# Patient Record
Sex: Male | Born: 1965 | Race: Black or African American | Hispanic: No | Marital: Single | Smoking: Never smoker
Health system: Southern US, Community
[De-identification: ages and names within clinical notes are randomized; demographics above are authoritative.]

## PROBLEM LIST (undated history)

## (undated) DIAGNOSIS — E785 Hyperlipidemia, unspecified: Secondary | ICD-10-CM

## (undated) DIAGNOSIS — I10 Essential (primary) hypertension: Secondary | ICD-10-CM

## (undated) DIAGNOSIS — E119 Type 2 diabetes mellitus without complications: Secondary | ICD-10-CM

## (undated) HISTORY — DX: Hyperlipidemia, unspecified: E78.5

## (undated) HISTORY — DX: Type 2 diabetes mellitus without complications: E11.9

## (undated) HISTORY — DX: Essential (primary) hypertension: I10

---

## 2014-07-14 ENCOUNTER — Encounter (HOSPITAL_COMMUNITY): Payer: Self-pay | Admitting: Emergency Medicine

## 2014-07-14 DIAGNOSIS — J159 Unspecified bacterial pneumonia: Secondary | ICD-10-CM | POA: Insufficient documentation

## 2014-07-14 DIAGNOSIS — I10 Essential (primary) hypertension: Secondary | ICD-10-CM | POA: Insufficient documentation

## 2014-07-14 MED ORDER — ACETAMINOPHEN 325 MG PO TABS
650.0000 mg | ORAL_TABLET | Freq: Once | ORAL | Status: AC
  Administered 2014-07-14: 650 mg via ORAL
  Filled 2014-07-14: qty 2

## 2014-07-14 NOTE — ED Notes (Signed)
Pt presents with productive cough for the past 3 days- admits that today he noticed blood in his sputum.  Denies pain.

## 2014-07-15 ENCOUNTER — Encounter (HOSPITAL_COMMUNITY): Payer: Self-pay | Admitting: Radiology

## 2014-07-15 ENCOUNTER — Emergency Department (HOSPITAL_COMMUNITY): Payer: Self-pay

## 2014-07-15 ENCOUNTER — Emergency Department (HOSPITAL_COMMUNITY)
Admission: EM | Admit: 2014-07-15 | Discharge: 2014-07-15 | Disposition: A | Discharge status: Home / Self Care | Payer: Self-pay | Attending: Emergency Medicine | Admitting: Emergency Medicine

## 2014-07-15 DIAGNOSIS — R0602 Shortness of breath: Secondary | ICD-10-CM

## 2014-07-15 DIAGNOSIS — J189 Pneumonia, unspecified organism: Secondary | ICD-10-CM

## 2014-07-15 DIAGNOSIS — I10 Essential (primary) hypertension: Secondary | ICD-10-CM

## 2014-07-15 LAB — CBC
HEMATOCRIT: 36.9 % — AB (ref 39.0–52.0)
Hemoglobin: 12.9 g/dL — ABNORMAL LOW (ref 13.0–17.0)
MCH: 27.6 pg (ref 26.0–34.0)
MCHC: 35 g/dL (ref 30.0–36.0)
MCV: 78.8 fL (ref 78.0–100.0)
PLATELETS: 374 10*3/uL (ref 150–400)
RBC: 4.68 MIL/uL (ref 4.22–5.81)
RDW: 12.1 % (ref 11.5–15.5)
WBC: 6.1 10*3/uL (ref 4.0–10.5)

## 2014-07-15 LAB — COMPREHENSIVE METABOLIC PANEL
ALT: 35 U/L (ref 0–53)
AST: 33 U/L (ref 0–37)
Albumin: 3.1 g/dL — ABNORMAL LOW (ref 3.5–5.2)
Alkaline Phosphatase: 69 U/L (ref 39–117)
Anion gap: 10 (ref 5–15)
BUN: <5 mg/dL — ABNORMAL LOW (ref 6–23)
CALCIUM: 8.2 mg/dL — AB (ref 8.4–10.5)
CO2: 26 mmol/L (ref 19–32)
Chloride: 96 mmol/L (ref 96–112)
Creatinine, Ser: 1.04 mg/dL (ref 0.50–1.35)
GFR calc Af Amer: >90 mL/min (ref >90)
GFR calc non Af Amer: 83 mL/min — ABNORMAL LOW (ref >90)
Glucose, Bld: 159 mg/dL — ABNORMAL HIGH (ref 70–99)
Potassium: 3.2 mmol/L — ABNORMAL LOW (ref 3.5–5.1)
SODIUM: 132 mmol/L — AB (ref 135–145)
TOTAL PROTEIN: 7.8 g/dL (ref 6.0–8.3)
Total Bilirubin: 1.2 mg/dL (ref 0.3–1.2)

## 2014-07-15 LAB — I-STAT TROPONIN, ED: Troponin i, poc: 0 ng/mL (ref 0.00–0.08)

## 2014-07-15 MED ORDER — POTASSIUM CHLORIDE CRYS ER 20 MEQ PO TBCR
20.0000 meq | EXTENDED_RELEASE_TABLET | Freq: Once | ORAL | Status: AC
  Administered 2014-07-15: 20 meq via ORAL
  Filled 2014-07-15: qty 1

## 2014-07-15 MED ORDER — CEFTRIAXONE SODIUM 1 G IJ SOLR
1.0000 g | Freq: Once | INTRAMUSCULAR | Status: AC
  Administered 2014-07-15: 1 g via INTRAMUSCULAR
  Filled 2014-07-15: qty 10

## 2014-07-15 MED ORDER — AZITHROMYCIN 250 MG PO TABS: 250.0000 mg | ORAL_TABLET | Freq: Every day | ORAL | Status: DC

## 2014-07-15 MED ORDER — HYDROCOD POLST-CHLORPHEN POLST 10-8 MG/5ML PO LQCR: 5.0000 mL | Freq: Two times a day (BID) | ORAL | Status: DC

## 2014-07-15 MED ORDER — AZITHROMYCIN 250 MG PO TABS
500.0000 mg | ORAL_TABLET | Freq: Once | ORAL | Status: AC
  Administered 2014-07-15: 500 mg via ORAL
  Filled 2014-07-15: qty 2

## 2014-07-15 MED ORDER — AMLODIPINE BESYLATE 10 MG PO TABS: 10.0000 mg | ORAL_TABLET | Freq: Once | ORAL | Status: DC

## 2014-07-15 MED ORDER — AMLODIPINE BESYLATE 5 MG PO TABS
10.0000 mg | ORAL_TABLET | Freq: Once | ORAL | Status: AC
  Administered 2014-07-15: 10 mg via ORAL
  Filled 2014-07-15: qty 2

## 2014-07-15 MED ORDER — IOHEXOL 350 MG/ML SOLN
80.0000 mL | Freq: Once | INTRAVENOUS | Status: AC | PRN
  Administered 2014-07-15: 71 mL via INTRAVENOUS

## 2014-07-15 MED ORDER — LIDOCAINE HCL (PF) 1 % IJ SOLN
5.0000 mL | Freq: Once | INTRAMUSCULAR | Status: AC
  Administered 2014-07-15: 5 mL
  Filled 2014-07-15: qty 5

## 2014-07-15 NOTE — Discharge Instructions (Signed)
It is VERY important that you establish with a primary care physician Use the resource to help. Please take all the antibiotic until completed Take your blood pressure medication at bedtime to allow your body to adjust       Emergency Department Resource Guide 1) Find a Doctor and Pay Out of Pocket Although you won't have to find out who is covered by your insurance plan, it is a good idea to ask around and get recommendations. You will then need to call the office and see if the doctor you have chosen will accept you as a new patient and what types of options they offer for patients who are self-pay. Some doctors offer discounts or will set up payment plans for their patients who do not have insurance, but you will need to ask so you aren't surprised when you get to your appointment.  2) Contact Your Local Health Department Not all health departments have doctors that can see patients for sick visits, but many do, so it is worth a call to see if yours does. If you don't know where your local health department is, you can check in your phone book. The CDC also has a tool to help you locate your state's health department, and many state websites also have listings of all of their local health departments.  3) Find a Walk-in Clinic If your illness is not likely to be very severe or complicated, you may want to try a walk in clinic. These are popping up all over the country in pharmacies, drugstores, and shopping centers. They're usually staffed by nurse practitioners or physician assistants that have been trained to treat common illnesses and complaints. They're usually fairly quick and inexpensive. However, if you have serious medical issues or chronic medical problems, these are probably not your best option.  No Primary Care Doctor: - Call Health Connect at  [phone] - they can help you locate a primary care doctor that  accepts your insurance, provides certain services, etc. - Physician  Referral Service- [phone]  Chronic Pain Problems: Organization         Address  Phone   Notes  [facility]  [phone] Patients need to be referred by their primary care doctor.   Medication Assistance: Organization         Address  Phone   Notes  [facility] [place] [facility] [address] [place], [place] [address] [phone]) [phone] --Must be a resident of [facility] [place] [place] -- Must have NO insurance coverage whatsoever (no Medicaid/ Medicare, etc.) -- The pt. MUST have a primary care doctor that directs their care regularly and follows them in the community   MedAssist  [phone]   [employer]  [phone]    Agencies that provide inexpensive medical care: Organization         Address  Phone   Notes  [facility]  [phone]   [facility]    [phone]   [facility] [address] [place], [place] [address] [phone]   [facility] [address], [place] [phone]   [facility]    [phone]) [phone]   [facility]    [phone]) [phone]   [facility]  [address], [place] Phone:  [phone]) [phone], Fax:  [phone]) [phone] Hours of Operation:  9 am - 6 pm, M-F.  Also accepts Medicaid/Medicare  and self-pay.  [facility] for [place], [address], [place] Phone: [phone], Fax: [phone]. Hours of Operation:  8:30 am - 5:30 pm, M-F.  Also accepts Medicaid and self-pay.  [facility] [place] [address] [place] Phone: [phone]   [place], [place], [place] [phone], Ext. [phone] Mondays & Thursdays: 7-9 AM.  First 15 patients are seen on a first come, first serve basis.    [place] Providers:  Organization         Address  Phone   Notes  [facility] [address],  [phone] Also accepts self-pay patients.  [facility] [address], [place]  [phone]   [place], [address], [place] [phone]   [facility] [address], [place] [phone]   Lucianne Lei [address], [place]   [phone] Only accepts [place] Access [place] patients after they have their name applied to their card.   Self-Pay (no insurance) in [facility]:  Organization         Address  Phone   Notes  Sickle Cell Patients, [facility] [place] [phone]   [facility] [place] [phone]   [facility] [place]  [place], [place],  [phone]   [facility]/Dr. Osei-Bonsu  [address], [place] or [address], [place] [phone] Phone number for both [place] and [place] locations is the same.  Urgent [facility] and [facility] [address], [place] [phone]   [facility] [address] [place] or [address] [phone]   [facility] [address], [place] [phone], phone; [phone], fax Sees patients 1st and 3rd Saturday of every month.  Must not qualify for public or private insurance (i.e. Medicaid, Medicare, [place] Health Choice, Veterans' Benefits)  Household income should be no more than 200% of the poverty level The clinic cannot treat you if you are pregnant or think you are pregnant  Sexually transmitted diseases are not treated at the clinic.    Dental Care: Organization         Address  Phone  Notes  [facility] Department of [facility] [place] [phone] Accepts children up to age 51 who are enrolled  in [place] or [place]; pregnant women with a Medicaid card; and children who have applied for Medicaid or [place] Health Choice, but were declined, whose parents can pay a reduced fee at time of service.  [facility] Department of [facility]  [address], [place] [phone] Accepts children up to age 68 who are enrolled in [place] or [place]; pregnant women with a Medicaid card; and children who have applied for Medicaid or  Health Choice, but were declined, whose parents can pay a reduced fee at time of service.  [facility]  [place] [phone] Patients are seen by appointment only. Walk-ins are not accepted. [place] will see patients 22 years of age and older. Monday - Tuesday (8am-5pm) Most Wednesdays (  8:30-5pm) $30 per visit, cash only  [facility] Access PROGRAM  [address], [facility] [phone] Patients are seen by appointment only. Walk-ins are not accepted. [place] will see patients 65 years of age and older. One Wednesday Evening (Monthly: Volunteer Based).  $30 per visit, cash only  [place]  [phone] for adults; Children under age 65, call Graduate Pediatric Dentistry at [phone]. Children aged 30-14, please call [phone] to request a pediatric application.  Dental services are provided in all areas of dental care including fillings, crowns and bridges, complete and partial dentures, implants, gum treatment, root canals, and extractions. Preventive care is also provided. Treatment is provided to both adults and children. Patients are selected via a lottery and there is often a waiting list.   [facility] [address], [place]  [phone] [email or web address]   [facility] [address] [place], [place] [phone], Ext. [phone] Second and Fourth Thursday of each month, opens at  6:30 AM; Clinic ends at 9 AM.  Patients are seen on a first-come first-served basis, and a limited number are seen during each clinic.   [facility]  [address] Hillard Danker [place], [place] [phone]   Eligibility Requirements You must have lived in [place], [place], or [place] counties for at least the last three months.   You cannot be eligible for state or federal sponsored [employer], including [employer], [place], or [employer].   You generally cannot be eligible for healthcare insurance through your employer.    How to apply: Eligibility screenings are held every Tuesday and Wednesday afternoon from 1:00 pm until 4:00 pm. You do not need an appointment for the interview!  [facility] [address] [place], [place]   [place]  [place] Department  [place]  [phone]    Behavioral Health Resources in the Community: Intensive Outpatient Programs Organization         Address  Phone  Notes  [place] [place]. [address] [place], [place] [phone]   [facility] [address] [place], [place]   ADS: Alcohol & Drug Svcs [address], [place], [place]   [place] [address],  [place], [place] or [phone]   Substance Abuse Resources Organization         Address  Phone  Notes  Alcohol and Drug Services  [phone]   [place]  [phone]   The [place]   Chinita Pester  [phone]   Residential & Outpatient Substance Abuse Program  [phone]   Psychological Services Organization         Address  Phone  Notes  [facility] [place]  [place]  [phone]   [place] [address] [place] or [phone]    Mobile Crisis Teams Organization         Address  Phone  Notes  [facility], Mobile Crisis Care Unit  [phone]   [facility]  [address] [place], [place]   [place] Levels [address] [place] [phone]    Self-Help/Support Groups Organization         Address  Phone  Notes  [facility] of [place] - variety of support groups  [place] Call for more information  Narcotics Anonymous (NA), [facility] [address], [employer] [place]  2 meetings at this location   Special educational needs teacher         Address  Phone  Notes  [facility] Residential Treatment [place],    [place]  [phone]   [facility]  [address], [place] [phone], [place], [place]   [place], [place] [phone] Admissions: 8am-3pm M-F  Incentives Substance [place] [address],    [place], [place] [phone]   [facility] [address] [place], [place], [place]   [facility] [address],  [place], [place]   [facility] Programs - Intensive Outpatient [address] Kristeen Mans [address], [place], [place]   [facility] ([place].) [address].,  [place], [place] [phone] or [phone]   [facility] (RTS) [address] [place], [place] Accepts Medicaid  [place] [address]  [place] [phone] Substance Abuse/Addiction Treatment   [facility] Organization         Address  Phone  Notes  [facility]  [phone]   Domenic Schwab, PhD [address] Arlis Porta [place], [place]   [phone] or [phone]   [place], [place] [phone]   [facility]  [address] [place], [place] [phone] Insurance/Medicaid/sponsorship through [facility] and Families [address], [address] [place]                                    [place], [place] [phone] [place] [address], [place] [phone]    Dr. Adele Schilder  [phone]   [facility] Dept. 1) [address], [place] 2) [place] 3)  [place] 65, [place] [phone]  [phone]   [place] [phone] or [phone] (After Hours)

## 2014-07-15 NOTE — ED Notes (Signed)
Pt c/o cough and hoarseness. Reports having the flu last week with persistent cough. Pt noted to be hypertensive; no previous history. No neuro deficits.

## 2014-07-15 NOTE — ED Provider Notes (Signed)
CSN: [id number]     Arrival date & time 07/14/14  2320 History   First MD Initiated Contact with Patient 07/15/14 0216     Chief Complaint  Patient presents with  . Cough     (Consider location/radiation/quality/duration/timing/severity/associated sxs/prior Treatment) HPI Comments: This is a 49 year old normally healthy male who reports that last week he had the flu, since that time.  He said persistent, intermittent, nonproductive cough that is being some concerning to him.  He denies any shortness of breath, history of asthma or breathing difficulties.   Patient is a 49 y.o. male presenting with cough. The history is provided by the patient.  Cough Cough characteristics:  Non-productive Severity:  Moderate Onset quality:  Gradual Duration:  1 week Timing:  Intermittent Progression:  Unchanged Chronicity:  New Relieved by:  Nothing Worsened by:  Activity Ineffective treatments:  None tried Associated symptoms: no chest pain, no fever, no shortness of breath and no wheezing     History reviewed. No pertinent past medical history. History reviewed. No pertinent past surgical history. No family history on file. History  Substance Use Topics  . Smoking status: Never Smoker   . Smokeless tobacco: Not on file  . Alcohol Use: No    Review of Systems  Constitutional: Negative for fever.  Respiratory: Positive for cough. Negative for chest tightness, shortness of breath and wheezing.   Cardiovascular: Negative for chest pain, palpitations and leg swelling.  All other systems reviewed and are negative.     Allergies  Review of patient's allergies indicates no known allergies.  Home Medications   Prior to Admission medications   Medication Sig Start Date End Date Taking? Authorizing Provider  amLODipine (NORVASC) 10 MG tablet Take 1 tablet (10 mg total) by mouth once. 07/15/14   Arman Filter, NP  azithromycin (ZITHROMAX) 250 MG tablet Take 1 tablet (250 mg total) by mouth  daily. 07/15/14   Arman Filter, NP  chlorpheniramine-HYDROcodone (TUSSIONEX PENNKINETIC ER) 10-8 MG/5ML LQCR Take 5 mLs by mouth 2 (two) times daily. 07/15/14   Arman Filter, NP   BP 174/88 mmHg  Pulse 94  Temp(Src) 99.3 F (37.4 C)  Resp 18  Ht  (1.854 m)  Wt 230 lb (104.327 kg)  BMI 30.35 kg/m2  SpO2 94% Physical Exam  Constitutional: He appears well-developed and well-nourished. No distress.  HENT:  Head: Normocephalic.  Eyes: Pupils are equal, round, and reactive to light.  Neck: Normal range of motion.  Cardiovascular: Normal rate and regular rhythm.   Pulmonary/Chest: Effort normal and breath sounds normal.  Abdominal: Soft.  Musculoskeletal: Normal range of motion. He exhibits no edema.  Neurological: He is alert.  Skin: Skin is warm.  Nursing note and vitals reviewed.   ED Course  Procedures (including critical care time) Labs Review Labs Reviewed  CBC - Abnormal; Notable for the following:    Hemoglobin 12.9 (*)    HCT 36.9 (*)    All other components within normal limits  COMPREHENSIVE METABOLIC PANEL - Abnormal; Notable for the following:    Sodium 132 (*)    Potassium 3.2 (*)    Glucose, Bld 159 (*)    BUN <5 (*)    Calcium 8.2 (*)    Albumin 3.1 (*)    GFR calc non Af Amer 83 (*)    All other components within normal limits  Rosezena Sensor, ED    Imaging Review Dg Chest 2 View  07/15/2014   CLINICAL DATA:  Cough, shortness of breath, and fever for 4 days. Coughing up blood today.  EXAM: CHEST  2 VIEW  COMPARISON:  None.  FINDINGS: Focal airspace consolidation in the the anterior segment right upper lung consistent with pneumonia. Follow-up until resolution is recommended to exclude underlying obstructing lesion. It left lung is clear. Heart size and pulmonary vascularity are normal.  IMPRESSION: Focal airspace consolidation in the anterior segment right upper lung most consistent with with pneumonia. Follow-up until resolution is recommended to  exclude underlying mass lesion.   Electronically Signed   By: Burman Nieves M.D.   On: 07/15/2014 00:35   Ct Angio Chest Pe W/cm &/or Wo Cm  07/15/2014   CLINICAL DATA:  Cough and hoarseness. Flu last week with persistent cough. Shortness of breath.  EXAM: CT ANGIOGRAPHY CHEST WITH CONTRAST  TECHNIQUE: Multidetector CT imaging of the chest was performed using the standard protocol during bolus administration of intravenous contrast. Multiplanar CT image reconstructions and MIPs were obtained to evaluate the vascular anatomy.  CONTRAST:  71mL OMNIPAQUE IOHEXOL 350 MG/ML SOLN  COMPARISON:  Chest radiograph 07/15/2014  FINDINGS: Technically adequate study with good opacification of the central and segmental pulmonary arteries. No focal filling defects are demonstrated. No evidence of significant pulmonary embolus.  Normal heart size. Normal caliber thoracic aorta. No aortic dissection. Great vessel origins are patent. Esophagus is decompressed. No significant lymphadenopathy in the chest.  Dense airspace consolidation in the right middle lung with air bronchograms. Patchy infiltrates in the right upper lung, right lower lung, and left lung base. Changes likely represent multifocal pneumonia. Changes are likely represent multifocal pneumonia. Followup after resolution of acute process is recommended to exclude underlying neoplasm. No pleural effusions. No pneumothorax.  Included portions of the upper abdominal organs are grossly unremarkable. Small accessory spleen. No destructive bone lesions.  Review of the MIP images confirms the above findings.  IMPRESSION: No evidence of significant pulmonary embolus. Right middle lobe consolidation with patchy airspace disease throughout both lungs most consistent with multifocal pneumonia. Follow-up to resolution is recommended.   Electronically Signed   By: Burman Nieves M.D.   On: 07/15/2014 04:01     EKG Interpretation   Date/Time:  Wednesday July 14 2014  23:37:33 EST Ventricular Rate:  118 PR Interval:  132 QRS Duration: 82 QT Interval:  320 QTC Calculation: 448 R Axis:   70 Text Interpretation:  Sinus tachycardia Minimal voltage criteria for LVH,  may be normal variant Borderline ECG [id number] No old tracing to compare  Confirmed by Rhunette Croft, MD, Janey Genta [id number]) on 07/15/2014 2:52:18 AM     On physical exam patient appears well.  He has not tachypnea.  Cor hypoxic, but he is noted to have hypertension.  No history of hypertension.  Personally, but states his father has been hypertensive for many years.  Denies any visual disturbances, headaches, shortness of breath, chest pain, leg swelling.  Denies any recent travel. Due to the appearance of the pneumonia on chest x-ray and his EKG result, it is determined that he needs to have a CT angiogram of his chest to rule out PE.  This was explained to the patient who agrees CT angiography is negative for pulmonary M plus but does show that he has multilevel lobar pneumonia.  He has received IM Rocephin in the emergency department has been started on azithromycin.  He will also be started on Norvasc for blood pressure control.  He's been encouraged to light a primary care physician within the next  2 weeks MDM   Final diagnoses:  SOB (shortness of breath)  CAP (community acquired pneumonia)  Essential hypertension        Arman FilterGail K Winnifred Dufford, NP 07/15/14 [id number]  Derwood KaplanAnkit Nanavati, MD 07/16/14 0430

## 2015-08-06 IMAGING — CT CT ANGIO CHEST
2 of 8 series · 19 of 46 positions shown · IV contrast (omnipaque)
Comparison: Chest radiograph 07/15/2014

CLINICAL DATA: Cough and hoarseness. Flu last week with persistent
cough. Shortness of breath.

EXAM:
CT ANGIOGRAPHY CHEST WITH CONTRAST
TECHNIQUE: Multidetector CT imaging of the chest was performed using the
standard protocol during bolus administration of intravenous
contrast. Multiplanar CT image reconstructions and MIPs were
obtained to evaluate the vascular anatomy.
CONTRAST:  71mL OMNIPAQUE IOHEXOL 350 MG/ML SOLN

[Series 5: thins · axial · 0.67mm/px · z∈[-295,-15]mm · 16 of 308 slices shown]
[im 14/308  lung]
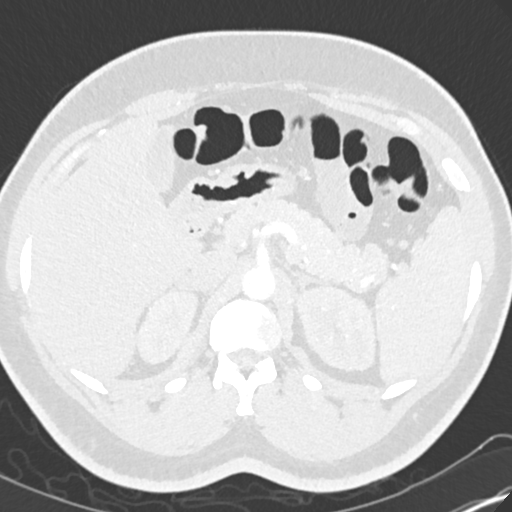
[im 28/308  soft-tissue]
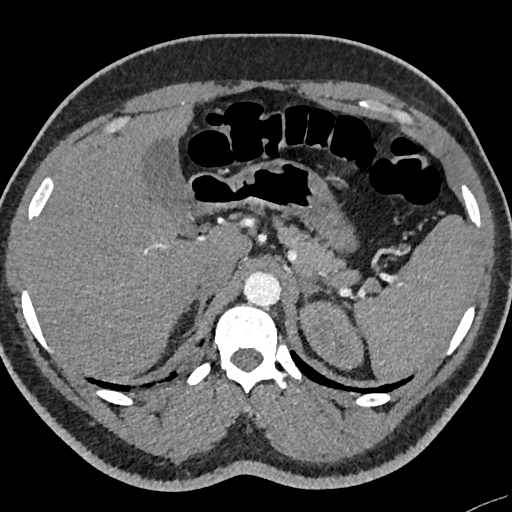
[im 56/308  lung]
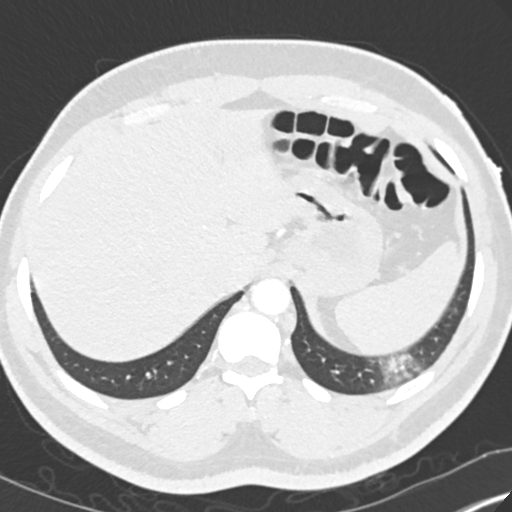
[im 70/308  soft-tissue]
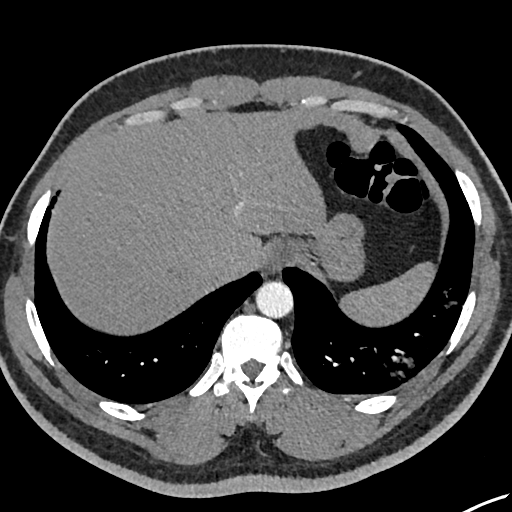
[im 84/308  lung]
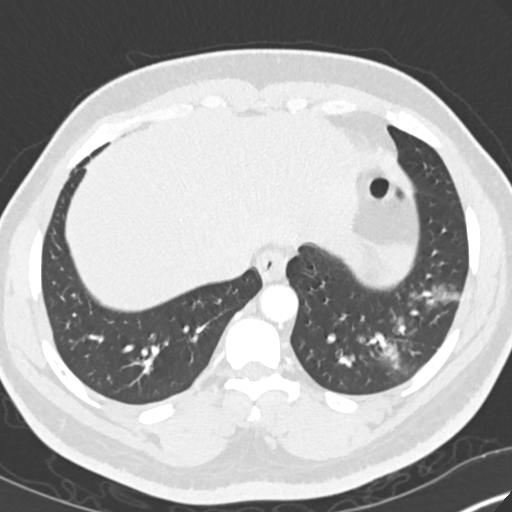
[im 112/308  soft-tissue]
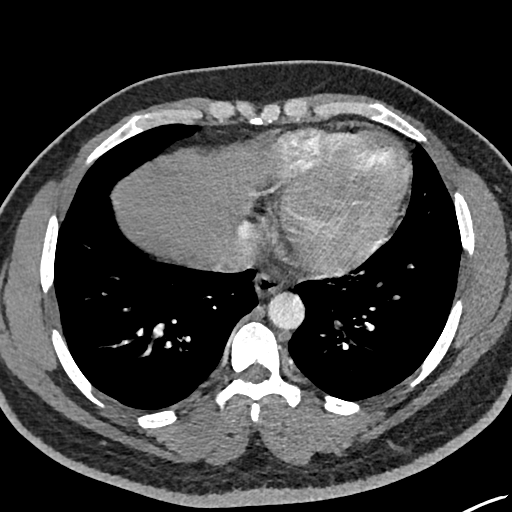
[im 126/308  lung]
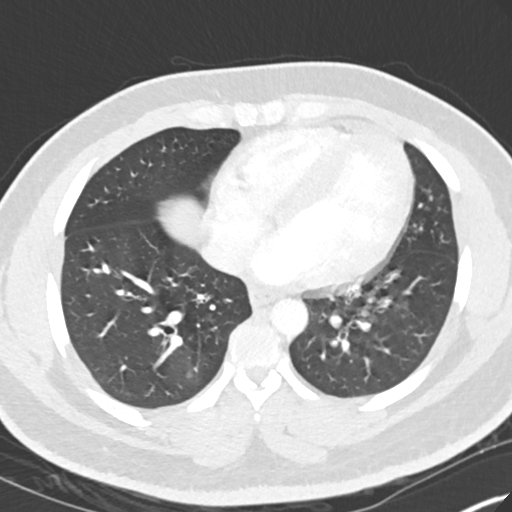
[im 140/308  soft-tissue]
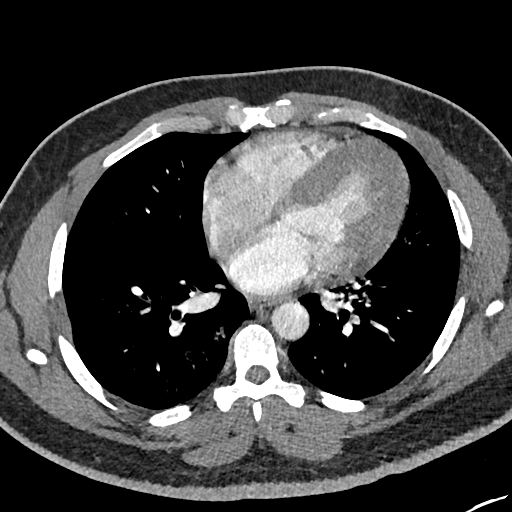
[im 168/308  lung]
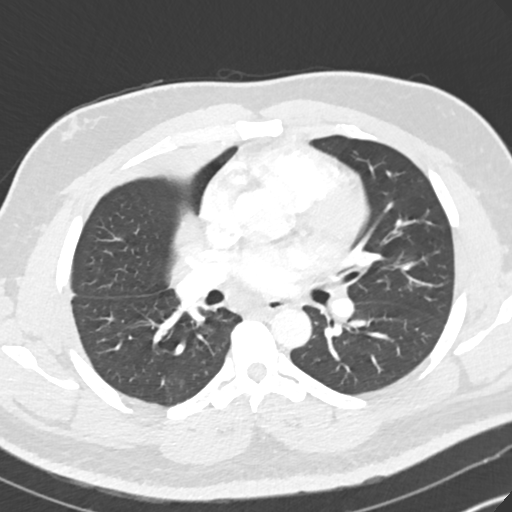
[im 182/308  soft-tissue]
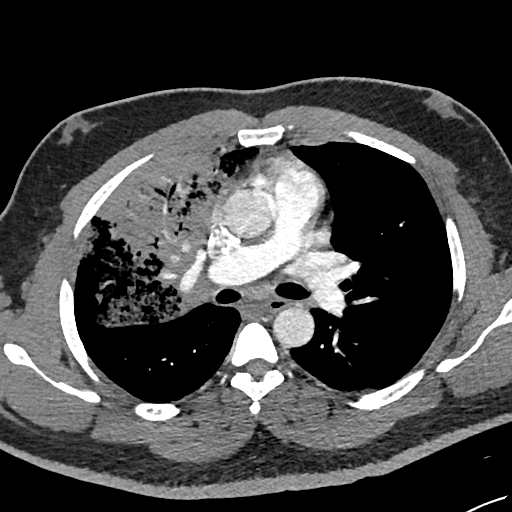
[im 196/308  lung]
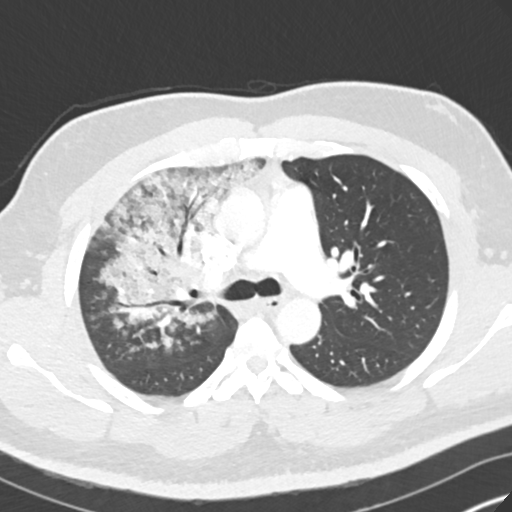
[im 224/308  soft-tissue]
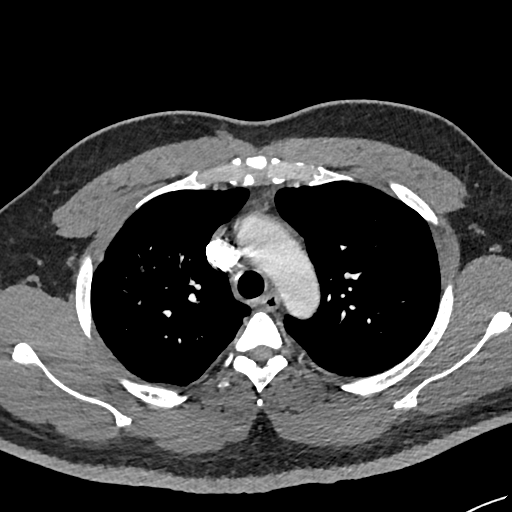
[im 238/308  lung]
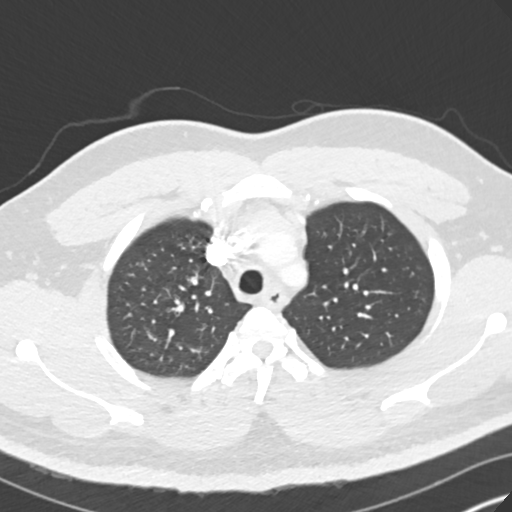
[im 252/308  soft-tissue]
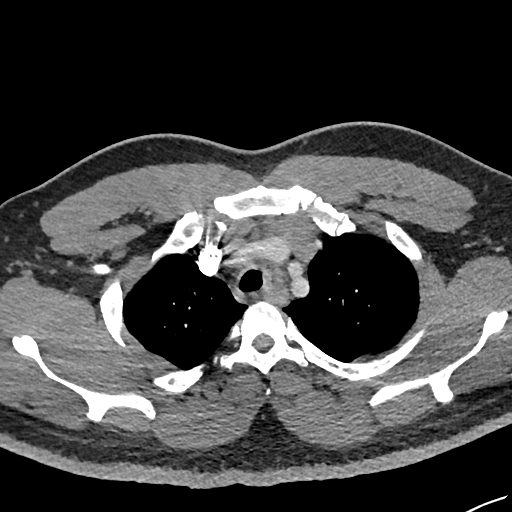
[im 280/308  lung]
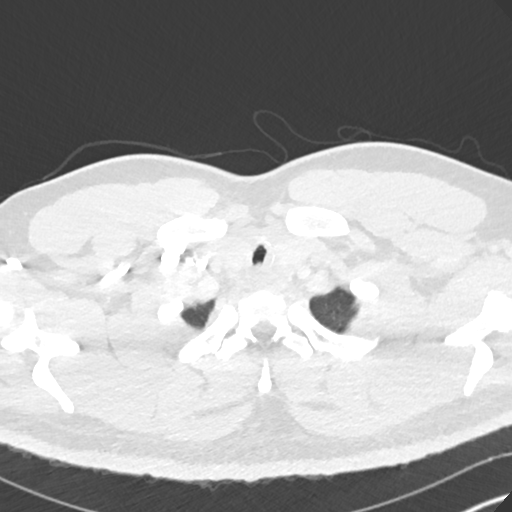
[im 294/308  soft-tissue]
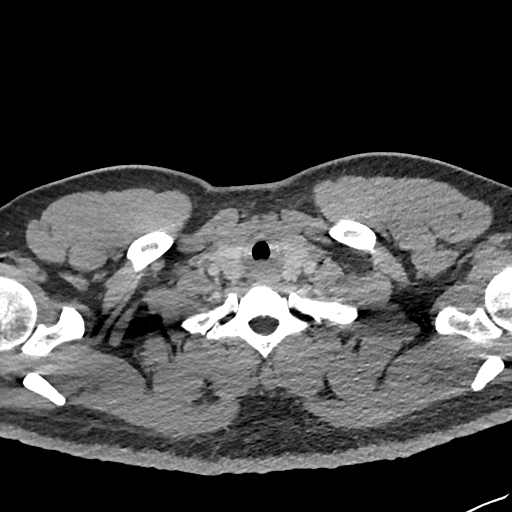

[Series 7: coronal mpr · coronal · 0.61mm/px · 3 of 119 slices shown]
[im 30/119  soft-tissue]
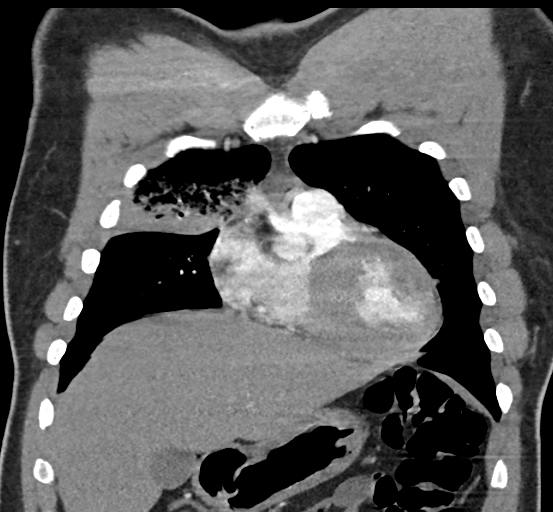
[im 60/119  soft-tissue]
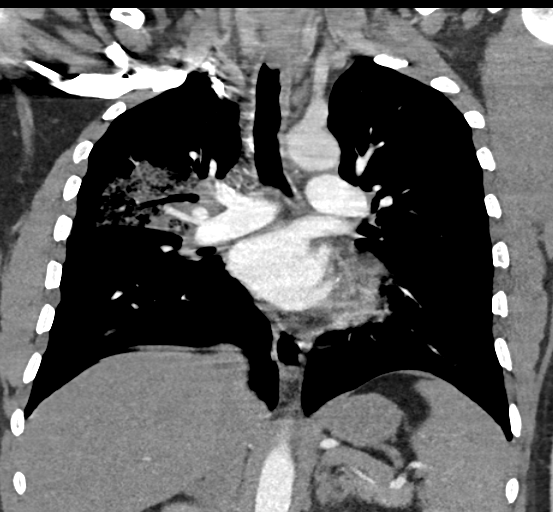
[im 89/119  soft-tissue]
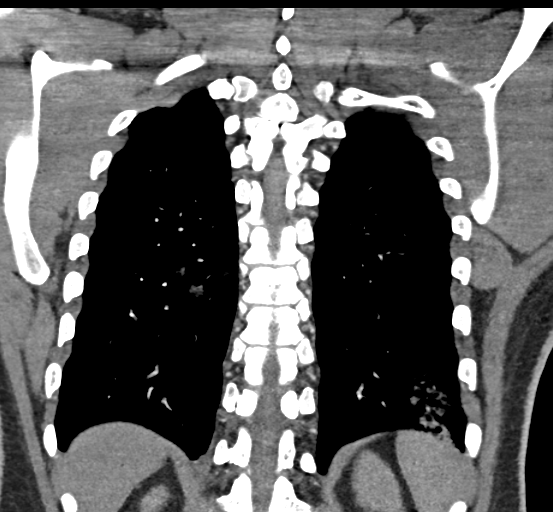

[19 of 46 positions shown; findings below may reference images not displayed]

FINDINGS: Technically adequate study with good opacification of the central
and segmental pulmonary arteries. No focal filling defects are
demonstrated. No evidence of significant pulmonary embolus.

Normal heart size. Normal caliber thoracic aorta. No aortic
dissection. Great vessel origins are patent. Esophagus is
decompressed. No significant lymphadenopathy in the chest.

Dense airspace consolidation in the right middle lung with air
bronchograms. Patchy infiltrates in the right upper lung, right
lower lung, and left lung base. Changes likely represent multifocal
pneumonia. Changes are likely represent multifocal pneumonia.
Followup after resolution of acute process is recommended to exclude
underlying neoplasm. No pleural effusions. No pneumothorax.

Included portions of the upper abdominal organs are grossly
unremarkable. Small accessory spleen. No destructive bone lesions.

Review of the MIP images confirms the above findings.
IMPRESSION: No evidence of significant pulmonary embolus. Right middle lobe
consolidation with patchy airspace disease throughout both lungs
most consistent with multifocal pneumonia. Follow-up to resolution
is recommended.

## 2020-10-11 ENCOUNTER — Encounter: Payer: 59 | Attending: Internal Medicine | Admitting: Dietician

## 2020-10-11 ENCOUNTER — Other Ambulatory Visit: Payer: Self-pay

## 2020-10-11 ENCOUNTER — Encounter: Payer: Self-pay | Admitting: Dietician

## 2020-10-11 DIAGNOSIS — E119 Type 2 diabetes mellitus without complications: Secondary | ICD-10-CM | POA: Diagnosis present

## 2020-10-11 NOTE — Progress Notes (Signed)
Patient was seen on 10/11/20 for the first of a series of three diabetes self[facility]management courses [facility].  Patient Education Plan per assessed needs and concerns is to attend three course education program for Diabetes Self Management Education.  The following learning objectives were met by the patient during this class:  Describe diabetes, types of diabetes and pathophysiology  State some common risk factors for diabetes  Defines the role of glucose and insulin  Describe the relationship between diabetes and cardiovascular and other risks  State the members of the Healthcare Team  States the rationale for glucose monitoring and when to test  State their individual Target Range  State the importance of logging glucose readings and how to interpret the readings  Identifies A1C target  Explain the correlation between A1c and eAG values  State symptoms and treatment of high blood glucose and low blood glucose  Explain proper technique for glucose testing and identify proper sharps disposal  Handouts given during class include:  How to Thrive:  A Guide for Your Journey with Diabetes by the ADA  Meal Plan Card and carbohydrate content list  Dietary intake form  Low Sodium Flavoring Tips  Types of Fats  Dining Out  Label reading  Snack list  Planning a balanced meal  The diabetes portion plate  Diabetes Resources  A1c to eAG Conversion Chart  Blood Glucose Log  Diabetes Recommended Care Schedule  Support Group  Diabetes Success Plan  Core Class Satisfaction Survey   Follow-Up Plan:  Attend core 2  

## 2020-10-17 ENCOUNTER — Other Ambulatory Visit: Payer: Self-pay | Admitting: Internal Medicine

## 2020-10-17 DIAGNOSIS — I10 Essential (primary) hypertension: Secondary | ICD-10-CM

## 2020-10-18 ENCOUNTER — Ambulatory Visit: Payer: 59

## 2020-10-25 ENCOUNTER — Ambulatory Visit: Payer: 59

## 2020-11-02 ENCOUNTER — Other Ambulatory Visit: Payer: 59

## 2020-11-04 ENCOUNTER — Other Ambulatory Visit: Payer: 59

## 2020-11-08 ENCOUNTER — Ambulatory Visit: Payer: 59 | Admitting: Dietician

## 2020-11-15 ENCOUNTER — Ambulatory Visit: Payer: 59

## 2020-11-17 ENCOUNTER — Other Ambulatory Visit: Payer: 59

## 2022-01-05 NOTE — Progress Notes (Deleted)
Name: Adam Beasley  MRN/ DOB: [id number], 1966-06-09    Age/ Sex: 56 y.o., male    PCP: Harvest Forest, MD   Reason for Endocrinology Evaluation: Resistant HTN     Date of Initial Endocrinology Evaluation: 01/08/2022     HPI: [name] is a 56 y.o. male with a past medical history of T2DM, HTN and dyslipidemia. The patient presented for initial endocrinology clinic visit on 01/08/2022 for consultative assistance with his resistant HTN.   Patient has been referred to endocrinology for further evaluation of resistant HTN and screening for hyperaldosteronism        HISTORY:  Past Medical History:  Past Medical History:  Diagnosis Date   Diabetes mellitus without complication (HCC)    Hyperlipidemia    Hypertension    Past Surgical History: No past surgical history on file.  Social History:  reports that he has never smoked. He has never used smokeless tobacco. He reports that he does not drink alcohol. Family History: family history is not on file.   HOME MEDICATIONS: Allergies as of 01/08/2022   No Known Allergies      Medication List        Accurate as of January 08, 2022  6:58 AM. If you have any questions, ask your nurse or doctor.          amLODipine 10 MG tablet Commonly known as: NORVASC Take 1 tablet (10 mg total) by mouth once.   atorvastatin 40 MG tablet Commonly known as: LIPITOR Take 40 mg by mouth daily.   azithromycin 250 MG tablet Commonly known as: ZITHROMAX Take 1 tablet (250 mg total) by mouth daily.   chlorpheniramine-HYDROcodone 10-8 MG/5ML Lqcr Commonly known as: Tussionex Pennkinetic ER Take 5 mLs by mouth 2 (two) times daily.   losartan 25 MG tablet Commonly known as: COZAAR Take 20 mg by mouth daily.   metFORMIN 500 MG tablet Commonly known as: GLUCOPHAGE Take by mouth 2 (two) times daily with a meal.   spironolactone 50 MG tablet Commonly known as: ALDACTONE Take 50 mg by mouth daily.           REVIEW OF SYSTEMS: A comprehensive ROS was conducted with the patient and is negative except as per HPI and below:  ROS     OBJECTIVE:  VS: There were no vitals taken for this visit.   Wt Readings from Last 3 Encounters:  10/11/20 241 lb (109.3 kg)  07/14/14 230 lb (104.3 kg)     EXAM: General: Pt appears well and is in NAD  Hydration: Well-hydrated with moist mucous membranes and good skin turgor  Eyes: External eye exam normal without stare, lid lag or exophthalmos.  EOM intact.  PERRL.  Ears, Nose, Throat: Hearing: Grossly intact bilaterally Dental: Good dentition  Throat: Clear without mass, erythema or exudate  Neck: General: Supple without adenopathy. Thyroid: Thyroid size normal.  No goiter or nodules appreciated. No thyroid bruit.  Lungs: Clear with good BS bilat with no rales, rhonchi, or wheezes  Heart: Auscultation: RRR.  Abdomen: Normoactive bowel sounds, soft, nontender, without masses or organomegaly palpable  Extremities: Gait and station: Normal gait  Digits and nails: No clubbing, cyanosis, petechiae, or nodes Head and neck: Normal alignment and mobility BL UE: Normal ROM and strength. BL LE: No pretibial edema normal ROM and strength.  Skin: Hair: Texture and amount normal with gender appropriate distribution Skin Inspection: No rashes, acanthosis nigricans/skin tags. No lipohypertrophy Skin Palpation: Skin temperature, texture, and  thickness normal to palpation  Neuro: Cranial nerves: II - XII grossly intact  Cerebellar: Normal coordination and movement; no tremor Motor: Normal strength throughout DTRs: 2+ and symmetric in UE without delay in relaxation phase  Mental Status: Judgment, insight: Intact Orientation: Oriented to time, place, and person Memory: Intact for recent and remote events Mood and affect: No depression, anxiety, or agitation     DATA REVIEWED: ***    ASSESSMENT/PLAN/RECOMMENDATIONS:   ***    Medications :  Signed  electronically by: Lyndle Herrlich, MD  [facility] Endocrinology  [facility] [address] [place], [place] [address] Phone: [phone] FAX: [phone]   CC: Harvest Forest, MD [address] [place] [address] Phone: [phone] Fax: [phone]   Return to Endocrinology clinic as below: Future Appointments  Date Time Provider Department Center  01/08/2022  7:50 AM Sarah-Jane Nazario, Konrad Dolores, MD [id number] None

## 2022-01-08 ENCOUNTER — Ambulatory Visit: Payer: Self-pay | Admitting: Internal Medicine

## 2022-07-24 ENCOUNTER — Other Ambulatory Visit: Payer: Self-pay | Admitting: Internal Medicine

## 2022-07-24 DIAGNOSIS — E2601 Conn's syndrome: Secondary | ICD-10-CM

## 2022-08-14 ENCOUNTER — Other Ambulatory Visit: Payer: Medicaid Other

## 2022-09-07 ENCOUNTER — Ambulatory Visit
Admission: RE | Admit: 2022-09-07 | Discharge: 2022-09-07 | Disposition: A | Discharge status: Home / Self Care | Payer: Medicaid Other | Source: Ambulatory Visit | Attending: Internal Medicine | Admitting: Internal Medicine

## 2022-09-07 DIAGNOSIS — E2601 Conn's syndrome: Secondary | ICD-10-CM | POA: Diagnosis not present

## 2022-12-12 DIAGNOSIS — E2601 Conn's syndrome: Secondary | ICD-10-CM | POA: Diagnosis not present

## 2022-12-12 DIAGNOSIS — Z1211 Encounter for screening for malignant neoplasm of colon: Secondary | ICD-10-CM | POA: Diagnosis not present

## 2022-12-12 DIAGNOSIS — I10 Essential (primary) hypertension: Secondary | ICD-10-CM | POA: Diagnosis not present

## 2022-12-12 DIAGNOSIS — Z23 Encounter for immunization: Secondary | ICD-10-CM | POA: Diagnosis not present

## 2022-12-12 DIAGNOSIS — R5383 Other fatigue: Secondary | ICD-10-CM | POA: Diagnosis not present

## 2022-12-12 DIAGNOSIS — Z125 Encounter for screening for malignant neoplasm of prostate: Secondary | ICD-10-CM | POA: Diagnosis not present

## 2022-12-12 DIAGNOSIS — Z0001 Encounter for general adult medical examination with abnormal findings: Secondary | ICD-10-CM | POA: Diagnosis not present

## 2022-12-12 DIAGNOSIS — E785 Hyperlipidemia, unspecified: Secondary | ICD-10-CM | POA: Diagnosis not present

## 2022-12-12 DIAGNOSIS — E669 Obesity, unspecified: Secondary | ICD-10-CM | POA: Diagnosis not present

## 2022-12-12 DIAGNOSIS — Z6833 Body mass index (BMI) 33.0-33.9, adult: Secondary | ICD-10-CM | POA: Diagnosis not present

## 2022-12-12 DIAGNOSIS — E1165 Type 2 diabetes mellitus with hyperglycemia: Secondary | ICD-10-CM | POA: Diagnosis not present

## 2022-12-28 DIAGNOSIS — Z0001 Encounter for general adult medical examination with abnormal findings: Secondary | ICD-10-CM | POA: Diagnosis not present

## 2022-12-28 DIAGNOSIS — E1165 Type 2 diabetes mellitus with hyperglycemia: Secondary | ICD-10-CM | POA: Diagnosis not present

## 2022-12-28 DIAGNOSIS — Z125 Encounter for screening for malignant neoplasm of prostate: Secondary | ICD-10-CM | POA: Diagnosis not present

## 2022-12-28 DIAGNOSIS — R5383 Other fatigue: Secondary | ICD-10-CM | POA: Diagnosis not present

## 2022-12-28 DIAGNOSIS — E785 Hyperlipidemia, unspecified: Secondary | ICD-10-CM | POA: Diagnosis not present

## 2023-01-16 DIAGNOSIS — E785 Hyperlipidemia, unspecified: Secondary | ICD-10-CM | POA: Diagnosis not present

## 2023-01-16 DIAGNOSIS — E669 Obesity, unspecified: Secondary | ICD-10-CM | POA: Diagnosis not present

## 2023-01-16 DIAGNOSIS — R5383 Other fatigue: Secondary | ICD-10-CM | POA: Diagnosis not present

## 2023-01-16 DIAGNOSIS — Z125 Encounter for screening for malignant neoplasm of prostate: Secondary | ICD-10-CM | POA: Diagnosis not present

## 2023-01-16 DIAGNOSIS — Z6833 Body mass index (BMI) 33.0-33.9, adult: Secondary | ICD-10-CM | POA: Diagnosis not present

## 2023-01-16 DIAGNOSIS — I10 Essential (primary) hypertension: Secondary | ICD-10-CM | POA: Diagnosis not present

## 2023-01-16 DIAGNOSIS — Z1211 Encounter for screening for malignant neoplasm of colon: Secondary | ICD-10-CM | POA: Diagnosis not present

## 2023-01-16 DIAGNOSIS — E1165 Type 2 diabetes mellitus with hyperglycemia: Secondary | ICD-10-CM | POA: Diagnosis not present

## 2023-01-16 DIAGNOSIS — E2601 Conn's syndrome: Secondary | ICD-10-CM | POA: Diagnosis not present

## 2023-03-04 DIAGNOSIS — Z1211 Encounter for screening for malignant neoplasm of colon: Secondary | ICD-10-CM | POA: Diagnosis not present

## 2023-05-13 ENCOUNTER — Ambulatory Visit: Payer: Medicaid Other | Admitting: Endocrinology

## 2023-05-20 ENCOUNTER — Encounter: Payer: Self-pay | Admitting: Endocrinology

## 2023-05-20 ENCOUNTER — Ambulatory Visit (INDEPENDENT_AMBULATORY_CARE_PROVIDER_SITE_OTHER): Payer: Medicaid Other | Admitting: Endocrinology

## 2023-05-20 VITALS — BP 184/110 | HR 84 | Resp 20 | Ht 73.0 in | Wt 243.6 lb

## 2023-05-20 DIAGNOSIS — I1A0 Resistant hypertension: Secondary | ICD-10-CM

## 2023-05-20 NOTE — Progress Notes (Addendum)
Outpatient Endocrinology Note [place] Jyles Sontag, MD  05/21/23  Patient's Name: Adam Beasley    DOB: Aug 13, 1965    MRN: [id number]  REASON OF VISIT: New consult for resistant hypertension/concern of primary aldosteronism  REFERRING PROVIDER: Harvest Forest, MD  PCP: Harvest Forest, MD  HISTORY OF PRESENT ILLNESS:   Adam Beasley is a 57 y.o. old male with past medical history listed below, is here for new consult of resistant hypertension with concern of primary aldosteronism.  Pertinent  History: Patient was diagnosed with hypertension at age of late 78s.  He is referred to endocrinology for the evaluation of resistant hypertension with concern of primary aldosteronism.  Patient reports antihypertensive medications were gradually added over time.   - Referral medical record reviewed, based on office note he had aldosterone renin activity ratio of 300 in 11/2020, at that time he was on amlodipine 10 mg daily, hydrochlorothiazide 25 mg daily and losartan 100 mg daily.  He was not taking his spironolactone and labetalol at that time, his spironolactone was held for the test, mentioned that he had  blood pressure of 143/92 without being on the spironolactone at that time.  - Spironolactone was started around April 2022, initial dose of 50 mg daily.  Later in June 2022 after the aldosterone renin test completed, hydrochlorothiazide was stopped and resumed on a spironolactone.  -Labetalol was added 20 mg twice daily in December 2023.  -In February 2024, spironolactone was increased to 100 mg daily.  At that time he was taking amlodipine 10 mg daily, labetalol 200 mg 2 times a day, losartan 100 mg daily.  Current antihypertensive medications: He reports compliance with these medications.  He recently started to take labetalol.  Doses of the medications may be different as compared to referral medical records as mentioned above.  Amlodipine 10 mg daily. Losartan 25 mg  daily. Spironolactone 50 mg daily for about 2 years.  Labetalol 100 mg two times a day.   He reports that blood pressure at home, he has been measuring almost every day in the morning, in the range of 145 -155 / 90-95.   Referral medical records reviewed, plasma free metanephrines were unremarkable in 2022, in Nov 10, 2020 free metanephrines 73 with low normal limit of 67, normetanephrine free 30 with low normal limit of 148, total free metanephrines and normetanephrine 103 with low normal limit of 205.  It seems like based on records review there was plan to refer to endocrinology from 2022/2023, patient was putting off because of no insurance etc. He is referred to endocrinology after having medical insurance coverage.  He had low potassium of 3.2 in 2016.  He had normal potassium after that in 2022 however was taking spironolactone and losartan.  He was on hydrochlorothiazide in the past as well.  Based on record he had potassium of 3.3 before taking spironolactone in the past.  In March 2024 he had renal ultrasound Doppler with normal renal artery stenosis.  # Type 2 diabetes mellitus : Currently on metformin, managed by primary care provider.  Interval history  Patient presented for evaluation of resistant hypertension with concern of primary aldosteronism.  He blood pressure is high in the clinic today.  Patient reports his blood pressure this morning was 145/92 at home.  He denies chest pain, headache, lightheadedness, palpitation.  REVIEW OF SYSTEMS:  As per history of present illness.   PAST MEDICAL HISTORY: Past Medical History:  Diagnosis Date   Diabetes mellitus without complication (HCC)  Hyperlipidemia    Hypertension     PAST SURGICAL HISTORY: History reviewed. No pertinent surgical history.  ALLERGIES: No Known Allergies  FAMILY HISTORY:  History reviewed. No pertinent family history.  SOCIAL HISTORY: Social History   Socioeconomic History   Marital status:  Single    Spouse name: Not on file   Number of children: Not on file   Years of education: Not on file   Highest education level: Not on file  Occupational History   Not on file  Tobacco Use   Smoking status: Never   Smokeless tobacco: Never  Substance and Sexual Activity   Alcohol use: No   Drug use: Not on file   Sexual activity: Not on file  Other Topics Concern   Not on file  Social History Narrative   Not on file   Social Determinants of Health   Financial Resource Strain: Not on file  Food Insecurity: Not on file  Transportation Needs: Not on file  Physical Activity: Not on file  Stress: Not on file  Social Connections: Not on file    MEDICATIONS:  Current Outpatient Medications  Medication Sig Dispense Refill   amLODipine (NORVASC) 10 MG tablet Take 1 tablet (10 mg total) by mouth once. 30 tablet 2   atorvastatin (LIPITOR) 40 MG tablet Take 40 mg by mouth daily.     azithromycin (ZITHROMAX) 250 MG tablet Take 1 tablet (250 mg total) by mouth daily. 4 tablet 0   chlorpheniramine-HYDROcodone (TUSSIONEX PENNKINETIC ER) 10-8 MG/5ML LQCR Take 5 mLs by mouth 2 (two) times daily. 90 mL 0   losartan (COZAAR) 25 MG tablet Take 20 mg by mouth daily.     metFORMIN (GLUCOPHAGE) 500 MG tablet Take by mouth 2 (two) times daily with a meal.     spironolactone (ALDACTONE) 50 MG tablet Take 50 mg by mouth daily.     No current facility-administered medications for this visit.    PHYSICAL EXAM: Vitals:   05/20/23 1454  BP: (!) 184/110  Pulse: 84  Resp: 20  SpO2: 97%  Weight: 243 lb 9.6 oz (110.5 kg)  Height: 6\' 1"  (1.854 m)   Body mass index is 32.14 kg/m.  Wt Readings from Last 3 Encounters:  05/20/23 243 lb 9.6 oz (110.5 kg)  10/11/20 241 lb (109.3 kg)  07/14/14 230 lb (104.3 kg)    General: Well developed, well nourished male in no apparent distress. No cushingoid appearance HEENT: AT/[place], no external lesions. Hearing intact to the spoken word Eyes: EOMI.  Conjunctiva clear and no icterus. Neck: Trachea midline, neck supple without appreciable thyromegaly or lymphadenopathy and no palpable thyroid nodules Lungs: Clear to auscultation, no wheeze. Respirations not labored Heart: S1S2, Regular in rate and rhythm. No loud murmurs Abdomen: Soft, non tender, non distended Neurologic: Alert, oriented, normal speech, deep tendon biceps reflexes normal,  no gross focal neurological deficit Extremities: No pedal pitting edema, no tremors of outstretched hands Skin: Warm, color good. Psychiatric: Does not appear depressed or anxious  PERTINENT HISTORIC LABORATORY AND IMAGING STUDIES:  All pertinent laboratory results were reviewed. Please see HPI for further details.  Lab Results  Component Value Date   CO2 30 05/20/2023   CL 101 05/20/2023   NA 139 05/20/2023   GLUCOSE 124 (H) 05/20/2023   BUN 13 05/20/2023   No components found for: "CORTRAND", "CORTISOL TOTAL AM", "ALDOSTERONE", "RENIN ACTIVITY", "DEHYDROEPIANDROSTERONE SULFATE", "CATECHOLAMINES FRACTIONATED"     ASSESSMENT / PLAN  1. Resistant hypertension    -Patient  has resistant hypertension requiring 4 antihypertensive medications at this time.  Although blood pressure is high in the clinic today, reports blood pressure 145 / 92 at home this morning.  He is asymptomatic. -Based on referral medical records he had aldosterone renin ratio more than 300 in 2022 with a concern of primary aldosteronism.  Clinical review of resistant hypertension, hypokalemia and prior test results concerning for primary aldosteronism. -He had normal renal ultrasound Doppler in March 2024. -I would like to investigate further laboratory workup for resistant hypertension including workup for primary aldosteronism.  He is currently on 4 antihypertensive medications. Although especially antihypertensive medication spironolactone, to some extent by losartan and minimally by amlodipine and labetalol will alter test  results, will interpret the results in this context.  If the test results are not conclusive, we will plan to hold some of these medications especially spironolactone and losartan, switch to other antihypertensive medications and retest. -Discussed that provided he has primary aldosteronism, surgical treatment is preferred however can also be managed with medical therapy including mineralocorticoid antagonist/spironolactone.  Patient may prefer medical therapy however is not sure at this time.  Plan: -Check aldosterone, renin -I would like to check plasma free metanephrines -Will check random cortisol -Will check BMP -Patient is advised to keep blood pressure log and bring to the clinic visit.  Patient is also asked to monitor daily and if high advised to call our clinic or discuss with primary care provider.   Diagnoses and all orders for this visit:  Resistant hypertension -     Aldosterone -     Cortisol -     Renin -     Basic metabolic panel -     Metanephrines, plasma    DISPOSITION Follow up in clinic in 6 weeks suggested.  All questions answered and patient verbalized understanding of the plan.  [place] Ivi Griffith, MD [facility] Endocrinology [facility] [address] [place], [address] [place], [place] [address] Phone # [phone]  At least part of this note was generated using voice recognition software. Inadvertent word errors may have occurred, which were not recognized during the proofreading process.  Addendum : On May 30, 2023, current antidiabetic medication confirmed with the patient over the phone as follows. Amlodipine 10 mg daily. Losartan 100 mg daily. Spironolactone 100 mg daily.  Labetalol 200 mg two times a day.   BP at home mostly: 140-145/90-92  He reports compliance with above-mentioned antihypertensive medications.  Recent lab results reviewed normal potassium 4.2.  Aldosterone is mildly elevated 27, creatinine is normal 0.46.  Cortisol and  plasma free metanephrines are normal.  Aldosterone and renin results may have been affected by current use of antihypertensive medication, results are not conclusive.  I called and talked with the patient, regarding of holding his current antihypertensive medications, switching to different antihypertensive medications and retesting in about 6 weeks.  Patient probably will not consider for adrenal surgery however he would like to do further testing to diagnose.   Latest Reference Range & Units 05/20/23 15:52  Sodium 135 - 146 mmol/L 139  Potassium 3.5 - 5.3 mmol/L 4.2  Chloride 98 - 110 mmol/L 101  CO2 20 - 32 mmol/L 30  Glucose 65 - 99 mg/dL 284 (H)  BUN 7 - 25 mg/dL 13  Creatinine 1.32 - 4.40 mg/dL 1.02  Calcium 8.6 - 72.5 mg/dL 9.5  (H): Data is abnormally high   Latest Reference Range & Units 05/20/23 15:52  ALDOSTERONE  ng/dL 27  Cortisol,  Plasma mcg/dL 9.1  Metanephrine, Pl <=29 pg/mL 42  Normetanephrine, Pl <=148 pg/mL 35  Glucose 65 - 99 mg/dL 562 (H)  Renin Activity 0.25 - 5.82 ng/mL/h 0.46  (H): Data is abnormally high   Plan: - Hold all of his current antihypertensive medications amlodipine/labetalol/spironolactone/losartan.  - Start following antihypertensive medication. -Verapamil extended release 120 mg daily. Hydralazine 10 mg 3 times a day. Doxazosin 1 mg daily.  Prescription sent.  Will check BMP in 2 weeks.  Patient is called and discussed in detail.  Patient is asked to check and monitor blood pressure every day at home and send blood pressure log every week, usually start sending from next Friday.  Asked to call our clinic anytime if you develop significant high blood pressure or low blood pressure.  Will decide on requirement of potassium based on plan for BMP test in 2 weeks.  [place] Rollan Roger, MD [facility] Endocrinology [facility] [address] [place], [address] [place], [place] [address] Phone # [phone]

## 2023-05-27 LAB — RENIN: Renin Activity: 0.46 ng/mL/h (ref 0.25–5.82)

## 2023-05-27 LAB — BASIC METABOLIC PANEL
BUN: 13 mg/dL (ref 7–25)
CO2: 30 mmol/L (ref 20–32)
Calcium: 9.5 mg/dL (ref 8.6–10.3)
Chloride: 101 mmol/L (ref 98–110)
Creat: 0.95 mg/dL (ref 0.70–1.30)
Glucose, Bld: 124 mg/dL — ABNORMAL HIGH (ref 65–99)
Potassium: 4.2 mmol/L (ref 3.5–5.3)
Sodium: 139 mmol/L (ref 135–146)

## 2023-05-27 LAB — ALDOSTERONE: Aldosterone: 27 ng/dL

## 2023-05-27 LAB — METANEPHRINES, PLASMA
Metanephrine, Free: 42 pg/mL (ref <=57)
Normetanephrine, Free: 35 pg/mL (ref <=148)
Total Metanephrines-Plasma: 77 pg/mL (ref <=205)

## 2023-05-27 LAB — CORTISOL: Cortisol, Plasma: 9.1 ug/dL

## 2023-05-29 DIAGNOSIS — E785 Hyperlipidemia, unspecified: Secondary | ICD-10-CM | POA: Diagnosis not present

## 2023-05-29 DIAGNOSIS — E1165 Type 2 diabetes mellitus with hyperglycemia: Secondary | ICD-10-CM | POA: Diagnosis not present

## 2023-05-29 DIAGNOSIS — I10 Essential (primary) hypertension: Secondary | ICD-10-CM | POA: Diagnosis not present

## 2023-05-29 DIAGNOSIS — J301 Allergic rhinitis due to pollen: Secondary | ICD-10-CM | POA: Diagnosis not present

## 2023-05-29 DIAGNOSIS — H6692 Otitis media, unspecified, left ear: Secondary | ICD-10-CM | POA: Diagnosis not present

## 2023-05-30 MED ORDER — HYDRALAZINE HCL 10 MG PO TABS: 10.0000 mg | ORAL_TABLET | Freq: Three times a day (TID) | ORAL | 11 refills | Status: DC

## 2023-05-30 MED ORDER — DOXAZOSIN MESYLATE 1 MG PO TABS: 1.0000 mg | ORAL_TABLET | Freq: Every day | ORAL | 11 refills | Status: DC

## 2023-05-30 MED ORDER — VERAPAMIL HCL ER 120 MG PO TBCR: 120.0000 mg | EXTENDED_RELEASE_TABLET | Freq: Every day | ORAL | 11 refills | Status: DC

## 2023-05-30 NOTE — Addendum Note (Signed)
Addended by: Amando Ishikawa, [place] on: 05/30/2023 02:49 PM   Modules accepted: Orders

## 2023-05-30 NOTE — Progress Notes (Addendum)
Please arrange for lab visit in 2 weeks for BMP.  I called and talked with the patient in detail and made following plan.  Labs reviewed:  Recent lab results reviewed normal potassium 4.2.  Aldosterone is mildly elevated 27, creatinine is normal 0.46.  Cortisol and plasma free metanephrines are normal.  Aldosterone and renin results may have been affected by current use of antihypertensive medication, results are not conclusive. I called and talked with the patient, we need to hold current antihypertensive medications, switch to different antihypertensive medications and retest.  Plan: - Hold all of his current antihypertensive medications amlodipine/labetalol/spironolactone/losartan.  - Start following antihypertensive medication. -Verapamil extended release 120 mg daily. Hydralazine 10 mg 3 times a day. Doxazosin 1 mg daily.  Prescription sent.  Will check BMP in 2 weeks.  Will plan to retest for renin and aldosterone after holding above-mentioned antihypertensive medication for 6 weeks.  Patient is asked to check and monitor blood pressure every day at home and send blood pressure log every week, usually start sending from next Friday.  Asked to call our clinic anytime if you develop significant high blood pressure or low blood pressure.  Will decide on requirement of potassium based on plan for BMP test in 2 weeks.

## 2023-06-29 ENCOUNTER — Encounter: Payer: Self-pay | Admitting: Endocrinology

## 2023-06-29 DIAGNOSIS — I1A0 Resistant hypertension: Secondary | ICD-10-CM

## 2023-07-01 MED ORDER — VERAPAMIL HCL ER 180 MG PO TBCR: 180.0000 mg | EXTENDED_RELEASE_TABLET | Freq: Every day | ORAL | 11 refills | Status: DC

## 2023-07-01 NOTE — Telephone Encounter (Signed)
 Blood pressure log reviewed.  He mostly has elevated systolic blood pressure. He confirms taking antihypertensive medication verapamil  sustained-release 120 mg daily, doxazosin  1 mg daily and hydralazine  10 mg 3 times a day. Called and talked with the patient over the phone.  Increase verapamil  sustained-release to 180 mg daily.  Continue same dose of hydralazine  and doxazosin .  Patient is asked to send blood pressure log in a week.  Cayley Pester, MD [facility] Endocrinology [facility] [address] [place], [address] [place], [place] [address] Phone # [phone]

## 2023-07-16 ENCOUNTER — Encounter: Payer: Self-pay | Admitting: Endocrinology

## 2023-07-16 ENCOUNTER — Ambulatory Visit (INDEPENDENT_AMBULATORY_CARE_PROVIDER_SITE_OTHER): Payer: Medicaid Other | Admitting: Endocrinology

## 2023-07-16 VITALS — BP 158/98 | HR 94 | Resp 20 | Ht 73.0 in | Wt 240.8 lb

## 2023-07-16 DIAGNOSIS — I1A0 Resistant hypertension: Secondary | ICD-10-CM | POA: Diagnosis not present

## 2023-07-16 NOTE — Progress Notes (Signed)
Outpatient Endocrinology Note [name], MD  07/16/23  Patient's Name: Adam Beasley    DOB: [birth year]    MRN: [id number]  REASON OF VISIT: Follow-up for resistant hypertension/concern of primary aldosteronism  REFERRING PROVIDER: Harvest Forest, MD  PCP: Harvest Forest, MD  HISTORY OF PRESENT ILLNESS:   Adam Beasley is a 58 y.o. old male with past medical history listed below, is here for follow-up of resistant hypertension with concern of primary aldosteronism.  Pertinent  History: Patient was diagnosed with hypertension at age of late 33s.  He was referred to endocrinology for the evaluation of resistant hypertension with concern of primary aldosteronism, was initially seen in beginning of December 2024.    Antihypertensive medications were gradually added over time.   - Referral medical record reviewed, based on office note he had aldosterone renin activity ratio of 300 in 11/2020, at that time he was on amlodipine 10 mg daily, hydrochlorothiazide 25 mg daily and losartan 100 mg daily.  He was not taking his spironolactone and labetalol at that time, his spironolactone was held for the test, mentioned that he had  blood pressure of 143/92 without being on the spironolactone at that time.  - Spironolactone was started around April 2022, initial dose of 50 mg daily.  Later in June 2022 after the aldosterone renin test completed, hydrochlorothiazide was stopped and resumed on a spironolactone.  -Labetalol was added 20 mg twice daily in December 2023.  -In February 2024, spironolactone was increased to 100 mg daily.  At that time he was taking amlodipine 10 mg daily, labetalol 200 mg 2 times a day, losartan 100 mg daily.  Antihypertensive medications in December 2024: He reports compliance with these medications.  There were discrepancy on the medications on chart and referral records review and what the patient reported.  On May 30, 2023, current antidiabetic  medication confirmed with the patient over the phone as follows. Amlodipine 10 mg daily. Losartan 100 mg daily. Spironolactone 100 mg daily.  Labetalol 200 mg two times a day.   BP at home mostly: 140-145/90-92.  Referral medical records reviewed, plasma free metanephrines were unremarkable in 2022, in Nov 10, 2020 free metanephrines 73 with low normal limit of 67, normetanephrine free 30 with low normal limit of 148, total free metanephrines and normetanephrine 103 with low normal limit of 205.  It seems like based on records review there was plan to refer to endocrinology from 2022/2023, patient was putting off because of no insurance etc. He is referred to endocrinology after having medical insurance coverage.  He had low potassium of 3.2 in 2016.  He had normal potassium after that in 2022 however was taking spironolactone and losartan.  He was on hydrochlorothiazide in the past as well.  Based on record he had potassium of 3.3 before taking spironolactone in the past.  In March 2024 he had renal ultrasound Doppler with normal renal artery stenosis.  # In May 20, 2023 : Laboratory workup with mildly elevated aldosterone and normal renin, normal metanephrines and normal cortisol.  Normal potassium.  Results for hyperaldosteronism was not conclusive, results may have been affected by above-mentioned antihypertensive medications.   Latest Reference Range & Units 05/20/23 15:52  ALDOSTERONE  ng/dL 27  Cortisol, Plasma mcg/dL 9.1  Metanephrine, Pl <=84 pg/mL 42  Normetanephrine, Pl <=148 pg/mL 35  Renin Activity 0.25 - 5.82 ng/mL/h 0.46   - Hold all of his current antihypertensive medications amlodipine/labetalol/spironolactone/losartan.  # Started on mid of December  2024 -Verapamil extended release 120 mg daily, later increased to 180 mg daily. Hydralazine 10 mg 3 times a day. Doxazosin 1 mg daily.  Interval history  Patient is currently taking antihypertensive medication verapamil  extended release 180 mg daily, doxazosin 1 mg daily and hydralazine 10 mg 3 times a day.  He reports compliance with these medications.  He reports blood pressure at home 140/90 range.  Blood pressure in the clinic mildly elevated today.  Denies headache, chest pain or shortness of breath.  Antihypertensive medications switched for retesting hyperaldosteronism workup as mentioned above.  Patient mentioned that he has complaints of snoring at night and his fiance and son has noticed apnea while sleeping.  He was not tested for obstructive sleep apnea and not diagnosed with it.  Lately he has been feeling easy fatigue especially while playing tennis.  REVIEW OF SYSTEMS:  As per history of present illness.   PAST MEDICAL HISTORY: Past Medical History:  Diagnosis Date   Diabetes mellitus without complication (HCC)    Hyperlipidemia    Hypertension     PAST SURGICAL HISTORY: History reviewed. No pertinent surgical history.  ALLERGIES: No Known Allergies  FAMILY HISTORY:  History reviewed. No pertinent family history.  SOCIAL HISTORY: Social History   Socioeconomic History   Marital status: Single    Spouse name: Not on file   Number of children: Not on file   Years of education: Not on file   Highest education level: Not on file  Occupational History   Not on file  Tobacco Use   Smoking status: Never   Smokeless tobacco: Never  Substance and Sexual Activity   Alcohol use: No   Drug use: Not on file   Sexual activity: Not on file  Other Topics Concern   Not on file  Social History Narrative   Not on file   Social Drivers of Health   Financial Resource Strain: Not on file  Food Insecurity: Not on file  Transportation Needs: Not on file  Physical Activity: Not on file  Stress: Not on file  Social Connections: Not on file    MEDICATIONS:  Current Outpatient Medications  Medication Sig Dispense Refill   atorvastatin (LIPITOR) 40 MG tablet Take 40 mg by mouth  daily.     doxazosin (CARDURA) 1 MG tablet Take 1 tablet (1 mg total) by mouth daily. 30 tablet 11   hydrALAZINE (APRESOLINE) 10 MG tablet Take 1 tablet (10 mg total) by mouth 3 (three) times daily. 90 tablet 11   metFORMIN (GLUCOPHAGE) 500 MG tablet Take by mouth 2 (two) times daily with a meal.     verapamil (CALAN-SR) 180 MG CR tablet Take 1 tablet (180 mg total) by mouth at bedtime. 30 tablet 11   amLODipine (NORVASC) 10 MG tablet Take 1 tablet (10 mg total) by mouth once. 30 tablet 2   azithromycin (ZITHROMAX) 250 MG tablet Take 1 tablet (250 mg total) by mouth daily. 4 tablet 0   chlorpheniramine-HYDROcodone (TUSSIONEX PENNKINETIC ER) 10-8 MG/5ML LQCR Take 5 mLs by mouth 2 (two) times daily. 90 mL 0   losartan (COZAAR) 25 MG tablet Take 20 mg by mouth daily.     spironolactone (ALDACTONE) 50 MG tablet Take 50 mg by mouth daily.     No current facility-administered medications for this visit.    PHYSICAL EXAM: Vitals:   07/16/23 1436 07/16/23 1437  BP: (!) 174/94 (!) 158/98  Pulse: 94   Resp: 20   SpO2: 99%  Weight: 240 lb 12.8 oz (109.2 kg)   Height: 6\' 1"  (1.854 m)     Body mass index is 31.77 kg/m.  Wt Readings from Last 3 Encounters:  07/16/23 240 lb 12.8 oz (109.2 kg)  05/20/23 243 lb 9.6 oz (110.5 kg)  10/11/20 241 lb (109.3 kg)    General: Well developed, well nourished male in no apparent distress. No cushingoid appearance HEENT: AT/, no external lesions. Hearing intact to the spoken word Eyes: EOMI. Conjunctiva clear and no icterus. Neck: Trachea midline, neck supple without appreciable thyromegaly or lymphadenopathy and no palpable thyroid nodules Lungs: Clear to auscultation, no wheeze. Respirations not labored Heart: S1S2, Regular in rate and rhythm.  Abdomen: Soft, non tender, non distended Neurologic: Alert, oriented, normal speech, deep tendon biceps reflexes normal,  no gross focal neurological deficit Extremities: No pedal pitting edema, no tremors of  outstretched hands Skin: Warm, color good. Psychiatric: Does not appear depressed or anxious  PERTINENT HISTORIC LABORATORY AND IMAGING STUDIES:  All pertinent laboratory results were reviewed. Please see HPI for further details.  Lab Results  Component Value Date   CO2 30 05/20/2023   CL 101 05/20/2023   NA 139 05/20/2023   GLUCOSE 124 (H) 05/20/2023   BUN 13 05/20/2023   No components found for: "CORTRAND", "CORTISOL TOTAL AM", "ALDOSTERONE", "RENIN ACTIVITY", "DEHYDROEPIANDROSTERONE SULFATE", "CATECHOLAMINES FRACTIONATED"     ASSESSMENT / PLAN  1. Resistant hypertension    -Patient has resistant hypertension requiring at least 4 antihypertensive medications.  Patient is being evaluated with a concern of primary aldosteronism.  Laboratory evaluation in December 2024 with elevated aldosterone of 27,renin normal 0.43, normal potassium however was taking spironolactone, losartan, labetalol, and amlodipine. -These antihypertensive medications are on hold for evaluation from mid of December. -Discussed that provided he has primary aldosteronism, surgical treatment is preferred however can also be managed with medical therapy including mineralocorticoid antagonist/spironolactone.  Patient may prefer medical therapy however is not sure at this time.  Plan: -Continue current antihypertensive medication.  His blood pressure is high in the clinic today however he reports at home he is having 140/90 range of blood pressure.  Patient is asked to call in a week with blood pressure log.  He is asymptomatic in the clinic today. -Continue verapamil extended release 180 mg daily. -Continue doxazosin 1 mg daily. -Continue hydralazine 10 mg 3 times a day. -Check BMP/potassium, aldosterone and renin today. -Patient is asked to continue on current antihypertensive medications, do not switch to previous antihypertensive medications, may need to do confirmatory test as well, after these test results.  #  Patient expressed symptoms of snoring, apnea during sleep concerning for obstructive sleep apnea, patient is asked to discuss with primary care provider for sleep study.  Diagnoses and all orders for this visit:  Resistant hypertension -     BASIC METABOLIC PANEL WITH GFR -     Aldosterone + renin activity w/ ratio    DISPOSITION Follow up in clinic in 6 weeks suggested.  All questions answered and patient verbalized understanding of the plan.  [place] Weylin Plagge, MD [facility] Endocrinology [facility] [address] [place], [address] [place], [place] [address] Phone # [phone]  At least part of this note was generated using voice recognition software. Inadvertent word errors may have occurred, which were not recognized during the proofreading process.

## 2023-07-17 ENCOUNTER — Ambulatory Visit: Payer: Medicaid Other | Admitting: Endocrinology

## 2023-07-21 LAB — BASIC METABOLIC PANEL WITH GFR
BUN: 16 mg/dL (ref 7–25)
CO2: 27 mmol/L (ref 20–32)
Calcium: 9.3 mg/dL (ref 8.6–10.3)
Chloride: 100 mmol/L (ref 98–110)
Creat: 0.93 mg/dL (ref 0.70–1.30)
Glucose, Bld: 172 mg/dL — ABNORMAL HIGH (ref 65–99)
Potassium: 3.9 mmol/L (ref 3.5–5.3)
Sodium: 137 mmol/L (ref 135–146)
eGFR: 96 mL/min/{1.73_m2} (ref >=60)

## 2023-07-21 LAB — ALDOSTERONE + RENIN ACTIVITY W/ RATIO
ALDO / PRA Ratio: 31.6 {ratio} — ABNORMAL HIGH (ref 0.9–28.9)
Aldosterone: 6 ng/dL
Renin Activity: 0.19 ng/mL/h — ABNORMAL LOW (ref 0.25–5.82)

## 2023-07-22 ENCOUNTER — Telehealth: Payer: Self-pay | Admitting: Endocrinology

## 2023-07-22 ENCOUNTER — Encounter: Payer: Self-pay | Admitting: Endocrinology

## 2023-07-22 DIAGNOSIS — I1A0 Resistant hypertension: Secondary | ICD-10-CM

## 2023-07-22 MED ORDER — SPIRONOLACTONE 100 MG PO TABS: 100.0000 mg | ORAL_TABLET | Freq: Every day | ORAL | 3 refills | Status: DC

## 2023-07-22 NOTE — Telephone Encounter (Signed)
Latest Reference Range & Units 07/16/23 15:15  ALDOSTERONE  ng/dL 6  ALDO / PRA Ratio 0.9 - 28.9 Ratio 31.6 (H)  Renin Activity 0.25 - 5.82 ng/mL/h 0.19 (L)  (H): Data is abnormally high (L): Data is abnormally low   Latest Reference Range & Units 07/16/23 15:15  Sodium 135 - 146 mmol/L 137  Potassium 3.5 - 5.3 mmol/L 3.9  Chloride 98 - 110 mmol/L 100  CO2 20 - 32 mmol/L 27  Glucose 65 - 99 mg/dL 578 (H)  BUN 7 - 25 mg/dL 16  Creatinine 4.69 - 6.29 mg/dL 5.28  Calcium 8.6 - 41.3 mg/dL 9.3  BUN/Creatinine Ratio 6 - 22 (calc) SEE NOTE:  eGFR > OR = 60 mL/min/1.45m2 96  (H): Data is abnormally high  Called and talked with the patient over the phone.  Labs reviewed aldosterone level is 6, perfectly normal.  Renin is mildly low and makes the aldosterone creatinine ratio of slightly elevated at 31.6.  With absolute value of plasma aldosterone concentration of 6, less likely to have primarily aldosteronism at least unlikely to have surgically curable hyperaldosteronism.  No additional plan for workup or laboratory test for aldosteronism at this time.  Will treat with medical therapy.  Patient is also advised to have sleep study to rule out sleep apnea as discussed in clinic visit.  He is currently taking verapamil extended release 180 mg daily, doxazosin 1 mg daily and hydralazine 10 mg 3 times a day.  Patient reports blood pressure at home usually 150/92.  Plan: -Resume spironolactone 100 mg daily. -Continue doxazosin 1 mg daily.  He denies complaints with lightheadedness or dizziness. -Stop verapamil and hydralazine.  Okay to take hydralazine for elevated blood pressure as needed, he has tablets at home. -Patient is asked to call our clinic in 1 week with blood pressure log.  Will gradually increase dose of spironolactone and adjust other antihypertensive medication as needed.  [place] Estevan Kersh, MD [facility] End[facility]inology [facility] [address] [place], [address] [place],  [place] [address] Phone # [phone]

## 2023-08-21 DIAGNOSIS — J301 Allergic rhinitis due to pollen: Secondary | ICD-10-CM | POA: Diagnosis not present

## 2023-08-21 DIAGNOSIS — E785 Hyperlipidemia, unspecified: Secondary | ICD-10-CM | POA: Diagnosis not present

## 2023-08-21 DIAGNOSIS — I160 Hypertensive urgency: Secondary | ICD-10-CM | POA: Diagnosis not present

## 2023-08-21 DIAGNOSIS — E669 Obesity, unspecified: Secondary | ICD-10-CM | POA: Diagnosis not present

## 2023-08-21 DIAGNOSIS — D649 Anemia, unspecified: Secondary | ICD-10-CM | POA: Diagnosis not present

## 2023-08-21 DIAGNOSIS — E1165 Type 2 diabetes mellitus with hyperglycemia: Secondary | ICD-10-CM | POA: Diagnosis not present

## 2023-08-21 DIAGNOSIS — Z6833 Body mass index (BMI) 33.0-33.9, adult: Secondary | ICD-10-CM | POA: Diagnosis not present

## 2023-08-21 DIAGNOSIS — G4733 Obstructive sleep apnea (adult) (pediatric): Secondary | ICD-10-CM | POA: Diagnosis not present

## 2023-08-21 DIAGNOSIS — I10 Essential (primary) hypertension: Secondary | ICD-10-CM | POA: Diagnosis not present

## 2023-08-28 ENCOUNTER — Encounter: Payer: Self-pay | Admitting: Endocrinology

## 2023-08-28 ENCOUNTER — Ambulatory Visit (INDEPENDENT_AMBULATORY_CARE_PROVIDER_SITE_OTHER): Payer: Medicaid Other | Admitting: Endocrinology

## 2023-08-28 VITALS — BP 172/102 | HR 97 | Resp 20 | Ht 73.0 in | Wt 245.0 lb

## 2023-08-28 DIAGNOSIS — I1A0 Resistant hypertension: Secondary | ICD-10-CM

## 2023-08-28 MED ORDER — VERAPAMIL HCL ER 180 MG PO TBCR: 180.0000 mg | EXTENDED_RELEASE_TABLET | Freq: Every day | ORAL | 3 refills | Status: DC

## 2023-08-28 MED ORDER — EPLERENONE 50 MG PO TABS: 50.0000 mg | ORAL_TABLET | Freq: Every day | ORAL | 3 refills | Status: AC

## 2023-08-28 MED ORDER — DOXAZOSIN MESYLATE 1 MG PO TABS: 2.0000 mg | ORAL_TABLET | Freq: Every day | ORAL | 3 refills | Status: DC

## 2023-08-28 NOTE — Progress Notes (Unsigned)
 Outpatient Endocrinology Note Adam Tinita Brooker, MD  08/29/23  Patient's Name: Adam Beasley    DOB: 30-May-1966    MRN: [id number]  REASON OF VISIT: Follow-up for resistant hypertension  REFERRING PROVIDER: Harvest Forest, MD  PCP: Harvest Forest, MD  HISTORY OF PRESENT ILLNESS:   Adam Beasley is a 58 y.o. old male with past medical history listed below, is here for follow-up of resistant hypertension.  Pertinent  History: Patient was diagnosed with hypertension at age of late 41s.  He was referred to endocrinology for the evaluation of resistant hypertension with concern of primary aldosteronism, was initially seen in beginning of December 2024.    Antihypertensive medications were gradually added over time.   - Referral medical record reviewed, based on office note he had aldosterone renin activity ratio of 300 in 11/2020, at that time he was on amlodipine 10 mg daily, hydrochlorothiazide 25 mg daily and losartan 100 mg daily.  He was not taking his spironolactone and labetalol at that time, his spironolactone was held for the test, mentioned that he had  blood pressure of 143/92 without being on the spironolactone at that time.  - Spironolactone was started around April 2022, initial dose of 50 mg daily.  Later in June 2022 after the aldosterone renin test completed, hydrochlorothiazide was stopped and resumed on a spironolactone.  -Labetalol was added 20 mg twice daily in December 2023.  -In February 2024, spironolactone was increased to 100 mg daily.  At that time he was taking amlodipine 10 mg daily, labetalol 200 mg 2 times a day, losartan 100 mg daily.  Antihypertensive medications in December 2024: He reports compliance with these medications.  There were discrepancy on the medications on chart and referral records review and what the patient reported.  On May 30, 2023, current antidiabetic medication confirmed with the patient over the phone as  follows. Amlodipine 10 mg daily. Losartan 100 mg daily. Spironolactone 100 mg daily.  Labetalol 200 mg two times a day.   BP at home mostly: 140-145/90-92.  Referral medical records reviewed, plasma free metanephrines were unremarkable in 2022, in Nov 10, 2020 free metanephrines 73 with low normal limit of 67, normetanephrine free 30 with low normal limit of 148, total free metanephrines and normetanephrine 103 with low normal limit of 205.  It seems like based on records review there was plan to refer to endocrinology from 2022/2023, patient was putting off because of no insurance etc. He is referred to endocrinology after having medical insurance coverage.  He had low potassium of 3.2 in 2016.  He had normal potassium after that in 2022 however was taking spironolactone and losartan.  He was on hydrochlorothiazide in the past as well.  Based on record he had potassium of 3.3 before taking spironolactone in the past.  In March 2024 he had renal ultrasound Doppler with normal renal artery stenosis.  # In May 20, 2023 : Laboratory workup with mildly elevated aldosterone and normal renin, normal metanephrines and normal cortisol.  Normal potassium.  Results for hyperaldosteronism was not conclusive, results may have been affected by above-mentioned antihypertensive medications.   Latest Reference Range & Units 05/20/23 15:52  ALDOSTERONE  ng/dL 27  Cortisol, Plasma mcg/dL 9.1  Metanephrine, Pl <=40 pg/mL 42  Normetanephrine, Pl <=148 pg/mL 35  Renin Activity 0.25 - 5.82 ng/mL/h 0.46   - Hold all of his current antihypertensive medications amlodipine/labetalol/spironolactone/losartan.  # Started on mid of December 2024 -Verapamil extended release 120 mg daily, later  increased to 180 mg daily. Hydralazine 10 mg 3 times a day. Doxazosin 1 mg daily.  # July 16, 2023 :    Latest Reference Range & Units 07/16/23 15:15  ALDOSTERONE  ng/dL 6  ALDO / PRA Ratio 0.9 - 28.9 Ratio 31.6 (H)   Renin Activity 0.25 - 5.82 ng/mL/h 0.19 (L)  (H): Data is abnormally high (L): Data is abnormally low   Latest Reference Range & Units 07/16/23 15:15  Sodium 135 - 146 mmol/L 137  Potassium 3.5 - 5.3 mmol/L 3.9  Chloride 98 - 110 mmol/L 100  CO2 20 - 32 mmol/L 27  Glucose 65 - 99 mg/dL 161 (H)  BUN 7 - 25 mg/dL 16  Creatinine 0.96 - 0.45 mg/dL 4.09  Calcium 8.6 - 81.1 mg/dL 9.3  BUN/Creatinine Ratio 6 - 22 (calc) SEE NOTE:  eGFR > OR = 60 mL/min/1.69m2 96  (H): Data is abnormally high  Aldosterone level is 6, perfectly normal.  Renin was mildly low and makes the aldosterone creatinine ratio of slightly elevated at 31.6.  With absolute value of plasma aldosterone concentration of 6, less likely to have primarily aldosteronism at least unlikely to have surgically curable hyperaldosteronism.  No additional plan for workup or laboratory test for aldosteronism at this time.  Will treat with medical therapy.  Spironolactone was resumed, continue doxazosin.  Advised to stop verapamil and hydralazine.  Interval history  Patient presented for the follow-up.  Patient had laboratory workup in the end of January, as reviewed above.  Plan was made to treat hypertension medically.  Patient restarted his spironolactone, he reports he is still taking verapamil and also doxazosin.  Blood pressure is high in the clinic today.  Patient reports he is having to take in last few days.  He reports compliance with antihypertensive medication, currently taking spironolactone 100 mg daily, verapamil 180 mg daily and doxazosin 1 mg daily.  Denies headache, lightheadedness, chest pain.  He has complaints of snoring and apnea during sleep, he was advised for sleep study to rule out obstructive sleep apnea, has not completed..  Patient did not bring blood pressure log as advised in the clinic visit.  REVIEW OF SYSTEMS:  As per history of present illness.   PAST MEDICAL HISTORY: Past Medical History:  Diagnosis  Date   Diabetes mellitus without complication (HCC)    Hyperlipidemia    Hypertension     PAST SURGICAL HISTORY: History reviewed. No pertinent surgical history.  ALLERGIES: No Known Allergies  FAMILY HISTORY:  History reviewed. No pertinent family history.  SOCIAL HISTORY: Social History   Socioeconomic History   Marital status: Single    Spouse name: Not on file   Number of children: Not on file   Years of education: Not on file   Highest education level: Not on file  Occupational History   Not on file  Tobacco Use   Smoking status: Never   Smokeless tobacco: Never  Substance and Sexual Activity   Alcohol use: No   Drug use: Not on file   Sexual activity: Not on file  Other Topics Concern   Not on file  Social History Narrative   Not on file   Social Drivers of Health   Financial Resource Strain: Not on file  Food Insecurity: Not on file  Transportation Needs: Not on file  Physical Activity: Not on file  Stress: Not on file  Social Connections: Not on file    MEDICATIONS:  Current Outpatient Medications  Medication Sig  Dispense Refill   eplerenone (INSPRA) 50 MG tablet Take 1 tablet (50 mg total) by mouth daily. 90 tablet 3   verapamil (CALAN-SR) 180 MG CR tablet Take 1 tablet (180 mg total) by mouth at bedtime. 90 tablet 3   atorvastatin (LIPITOR) 40 MG tablet Take 40 mg by mouth daily.     azithromycin (ZITHROMAX) 250 MG tablet Take 1 tablet (250 mg total) by mouth daily. 4 tablet 0   chlorpheniramine-HYDROcodone (TUSSIONEX PENNKINETIC ER) 10-8 MG/5ML LQCR Take 5 mLs by mouth 2 (two) times daily. 90 mL 0   doxazosin (CARDURA) 1 MG tablet Take 2 tablets (2 mg total) by mouth daily. 180 tablet 3   hydrALAZINE (APRESOLINE) 10 MG tablet Take 1 tablet (10 mg total) by mouth 3 (three) times daily. 90 tablet 11   metFORMIN (GLUCOPHAGE) 500 MG tablet Take by mouth 2 (two) times daily with a meal.     No current facility-administered medications for this visit.     PHYSICAL EXAM: Vitals:   08/28/23 1526 08/28/23 1527  BP: (!) 168/100 (!) 172/102  Pulse: 97   Resp: 20   SpO2: 98%   Weight: 245 lb (111.1 kg)   Height: 6\' 1"  (1.854 m)     Body mass index is 32.32 kg/m.  Wt Readings from Last 3 Encounters:  08/28/23 245 lb (111.1 kg)  07/16/23 240 lb 12.8 oz (109.2 kg)  05/20/23 243 lb 9.6 oz (110.5 kg)    General: Well developed, well nourished male in no apparent distress. No cushingoid appearance HEENT: AT/[place], no external lesions. Hearing intact to the spoken word Eyes: EOMI. Conjunctiva clear and no icterus. Neck: Trachea midline, neck supple without appreciable thyromegaly or lymphadenopathy and no palpable thyroid nodules Lungs: Clear to auscultation, no wheeze. Respirations not labored Heart: S1S2, Regular in rate and rhythm.  Abdomen: Soft, non tender, non distended Neurologic: Alert, oriented, normal speech, deep tendon biceps reflexes normal,  no gross focal neurological deficit Extremities: No pedal pitting edema, no tremors of outstretched hands Skin: Warm, color good. Psychiatric: Does not appear depressed or anxious  PERTINENT HISTORIC LABORATORY AND IMAGING STUDIES:  All pertinent laboratory results were reviewed. Please see HPI for further details.  Lab Results  Component Value Date   CO2 27 07/16/2023   CL 100 07/16/2023   NA 137 07/16/2023   GLUCOSE 172 (H) 07/16/2023   BUN 16 07/16/2023   No components found for: "CORTRAND", "CORTISOL TOTAL AM", "ALDOSTERONE", "RENIN ACTIVITY", "DEHYDROEPIANDROSTERONE SULFATE", "CATECHOLAMINES FRACTIONATED"     ASSESSMENT / PLAN  1. Resistant hypertension     -Patient has resistant hypertension requiring at least 4 antihypertensive medications.  Patient was being evaluated with a concern of primary aldosteronism.  Laboratory evaluation in December 2024 with elevated aldosterone of 27,renin normal 0.43, normal potassium however was taking spironolactone, losartan, labetalol,  and amlodipine.   -Laboratory evaluation in the January 2025 with aldosterone of 6, mildly low renin max mildly elevated aldosterone renin ratio, no hyperkalemia.  Tests were completed with medication adjustment.  Overall less likely to be related to primary aldosteronism/at least unlikely to be related to surgically curable hyperaldosteronism.  We discussed with patient decided to manage medically. -Blood pressure is still high in the clinic today.  He reports blood pressure is still high 160s over 90s.  Blood pressure is high in the clinic today.  Did not bring blood pressure log to review.  Questionable compliance.   Plan: -Stop spironolactone and switch to eplerenone 50 mg daily.  Due to concern of gynecomastia with his spironolactone. -Continue verapamil extended release 180 mg daily.  Patient reports he is still taking verapamil. -Increase doxazosin from 1 mg to 2 mg daily. -Advised to send messages with blood pressure log in a week to review. -Will probably follow-up 1 more time in this clinic, and will ultimately plan to send message to primary care provider for management of hypertension.   # Patient expressed symptoms of snoring, apnea during sleep concerning for obstructive sleep apnea, patient is asked to discuss with primary care provider for sleep study.  Diagnoses and all orders for this visit:  Resistant hypertension -     eplerenone (INSPRA) 50 MG tablet; Take 1 tablet (50 mg total) by mouth daily. -     doxazosin (CARDURA) 1 MG tablet; Take 2 tablets (2 mg total) by mouth daily. -     verapamil (CALAN-SR) 180 MG CR tablet; Take 1 tablet (180 mg total) by mouth at bedtime.   DISPOSITION Follow up in clinic in 6 weeks suggested.  All questions answered and patient verbalized understanding of the plan.  Adam Gelila Well, MD [facility] [address] [place], [address] [place], [place] [address] Phone # [phone]  At least part of this note  was generated using voice recognition software. Inadvertent word errors may have occurred, which were not recognized during the proofreading process.

## 2023-08-28 NOTE — Patient Instructions (Signed)
 Blood pressure regimen  Increase doxazosin from 1 mg to 2 mg daily. Stop spironolactone and start eplerenone 50 mg daily. Continue verapamil extended release 180 mg daily.  Please send blood pressure log in a week.

## 2023-08-29 ENCOUNTER — Encounter: Payer: Self-pay | Admitting: Endocrinology

## 2023-08-29 MED ORDER — AMLODIPINE BESYLATE 5 MG PO TABS: 5.0000 mg | ORAL_TABLET | Freq: Every day | ORAL | 3 refills | Status: DC

## 2023-08-29 NOTE — Telephone Encounter (Signed)
 Sent prescription for amlodipine 5 mg daily.  Instead of verapamil, stopp taking verapamil after taking amlodipine.

## 2023-10-14 ENCOUNTER — Ambulatory Visit: Admitting: Endocrinology

## 2023-10-25 ENCOUNTER — Emergency Department (HOSPITAL_BASED_OUTPATIENT_CLINIC_OR_DEPARTMENT_OTHER)

## 2023-10-25 ENCOUNTER — Encounter (HOSPITAL_BASED_OUTPATIENT_CLINIC_OR_DEPARTMENT_OTHER): Payer: Self-pay

## 2023-10-25 ENCOUNTER — Other Ambulatory Visit: Payer: Self-pay

## 2023-10-25 ENCOUNTER — Emergency Department (HOSPITAL_BASED_OUTPATIENT_CLINIC_OR_DEPARTMENT_OTHER)
Admission: EM | Admit: 2023-10-25 | Discharge: 2023-10-25 | Disposition: A | Discharge status: Home / Self Care | Attending: Emergency Medicine | Admitting: Emergency Medicine

## 2023-10-25 DIAGNOSIS — I10 Essential (primary) hypertension: Secondary | ICD-10-CM | POA: Diagnosis not present

## 2023-10-25 DIAGNOSIS — Z7984 Long term (current) use of oral hypoglycemic drugs: Secondary | ICD-10-CM | POA: Diagnosis not present

## 2023-10-25 DIAGNOSIS — Z7982 Long term (current) use of aspirin: Secondary | ICD-10-CM | POA: Diagnosis not present

## 2023-10-25 DIAGNOSIS — M25512 Pain in left shoulder: Secondary | ICD-10-CM | POA: Diagnosis not present

## 2023-10-25 DIAGNOSIS — R0789 Other chest pain: Secondary | ICD-10-CM | POA: Diagnosis not present

## 2023-10-25 DIAGNOSIS — Z79899 Other long term (current) drug therapy: Secondary | ICD-10-CM | POA: Diagnosis not present

## 2023-10-25 DIAGNOSIS — R1012 Left upper quadrant pain: Secondary | ICD-10-CM | POA: Diagnosis not present

## 2023-10-25 DIAGNOSIS — R0989 Other specified symptoms and signs involving the circulatory and respiratory systems: Secondary | ICD-10-CM | POA: Diagnosis not present

## 2023-10-25 DIAGNOSIS — K639 Disease of intestine, unspecified: Secondary | ICD-10-CM | POA: Insufficient documentation

## 2023-10-25 DIAGNOSIS — N3289 Other specified disorders of bladder: Secondary | ICD-10-CM | POA: Diagnosis not present

## 2023-10-25 DIAGNOSIS — R1032 Left lower quadrant pain: Secondary | ICD-10-CM | POA: Diagnosis not present

## 2023-10-25 DIAGNOSIS — J9811 Atelectasis: Secondary | ICD-10-CM | POA: Diagnosis not present

## 2023-10-25 DIAGNOSIS — R079 Chest pain, unspecified: Secondary | ICD-10-CM

## 2023-10-25 LAB — COMPREHENSIVE METABOLIC PANEL WITH GFR
ALT: 21 U/L (ref 0–44)
AST: 31 U/L (ref 15–41)
Albumin: 4.5 g/dL (ref 3.5–5.0)
Alkaline Phosphatase: 86 U/L (ref 38–126)
Anion gap: 16 — ABNORMAL HIGH (ref 5–15)
BUN: 11 mg/dL (ref 6–20)
CO2: 19 mmol/L — ABNORMAL LOW (ref 22–32)
Calcium: 9.2 mg/dL (ref 8.9–10.3)
Chloride: 101 mmol/L (ref 98–111)
Creatinine, Ser: 1.06 mg/dL (ref 0.61–1.24)
GFR, Estimated: >60 mL/min (ref >60)
Glucose, Bld: 134 mg/dL — ABNORMAL HIGH (ref 70–99)
Potassium: 4 mmol/L (ref 3.5–5.1)
Sodium: 137 mmol/L (ref 135–145)
Total Bilirubin: 1.1 mg/dL (ref 0.0–1.2)
Total Protein: 7.8 g/dL (ref 6.5–8.1)

## 2023-10-25 LAB — URINALYSIS, ROUTINE W REFLEX MICROSCOPIC
Bilirubin Urine: NEGATIVE
Glucose, UA: NEGATIVE mg/dL
Ketones, ur: NEGATIVE mg/dL
Leukocytes,Ua: NEGATIVE
Nitrite: NEGATIVE
Protein, ur: NEGATIVE mg/dL
Specific Gravity, Urine: <=1.005 (ref 1.005–1.030)
pH: 5.5 (ref 5.0–8.0)

## 2023-10-25 LAB — URINALYSIS, MICROSCOPIC (REFLEX)

## 2023-10-25 LAB — CBC
HCT: 40.6 % (ref 39.0–52.0)
Hemoglobin: 14 g/dL (ref 13.0–17.0)
MCH: 29.2 pg (ref 26.0–34.0)
MCHC: 34.5 g/dL (ref 30.0–36.0)
MCV: 84.6 fL (ref 80.0–100.0)
Platelets: 249 10*3/uL (ref 150–400)
RBC: 4.8 MIL/uL (ref 4.22–5.81)
RDW: 13.2 % (ref 11.5–15.5)
WBC: 6.1 10*3/uL (ref 4.0–10.5)
nRBC: 0 % (ref 0.0–0.2)

## 2023-10-25 LAB — TROPONIN T, HIGH SENSITIVITY
Troponin T High Sensitivity: <15 ng/L (ref <19)
Troponin T High Sensitivity: <15 ng/L (ref <19)

## 2023-10-25 LAB — LIPASE, BLOOD: Lipase: 32 U/L (ref 11–51)

## 2023-10-25 MED ORDER — ASPIRIN 81 MG PO CHEW
324.0000 mg | CHEWABLE_TABLET | Freq: Once | ORAL | Status: AC
  Administered 2023-10-25: 324 mg via ORAL
  Filled 2023-10-25: qty 4

## 2023-10-25 MED ORDER — IOHEXOL 350 MG/ML SOLN
100.0000 mL | Freq: Once | INTRAVENOUS | Status: AC | PRN
  Administered 2023-10-25: 100 mL via INTRAVENOUS

## 2023-10-25 NOTE — ED Provider Notes (Signed)
 [place] [facility] Provider Note   CSN: [id number] Arrival date & time: 10/25/23  1353     History {Add pertinent medical, surgical, social history, OB history to HPI:1} Chief Complaint  Patient presents with   Abdominal Pain   Shoulder Pain    Adam Beasley is a 58 y.o. male.  He has a history of hypertension.  Starting yesterday he was noticing some left upper quadrant left lower chest pain that radiated into his left posterior shoulder.  It recurred again today and so he came here for further evaluation.  Rates it as 5 out of 10.  Has never had it before.  Not associated with anything, no shortness of breath diaphoresis nausea vomiting constipation diarrhea urinary symptoms.  Does not recall any trauma.  Has tried nothing for it.  The history is provided by the patient.  Chest Pain Pain location:  L chest Pain quality: aching   Pain radiates to:  L shoulder Pain severity:  Moderate Onset quality:  Gradual Duration:  2 days Timing:  Constant Progression:  Unchanged Relieved by:  Nothing Worsened by:  Movement and deep breathing Ineffective treatments:  None tried Associated symptoms: no abdominal pain, no back pain, no cough, no diaphoresis, no dizziness, no fever, no nausea, no shortness of breath, no syncope and no vomiting   Risk factors: hypertension and male sex        Home Medications Prior to Admission medications   Medication Sig Start Date End Date Taking? Authorizing Provider  amLODipine  (NORVASC ) 5 MG tablet Take 1 tablet (5 mg total) by mouth daily. 08/29/23   Thapa, [place], MD  atorvastatin (LIPITOR) 40 MG tablet Take 40 mg by mouth daily.    [provider]  azithromycin  (ZITHROMAX ) 250 MG tablet Take 1 tablet (250 mg total) by mouth daily. 07/15/14   Franceen Inches, NP  chlorpheniramine-HYDROcodone [facility] PENNKINETIC ER) 10-8 MG/5ML LQCR Take 5 mLs by mouth 2 (two) times daily. 07/15/14   Franceen Inches, NP   doxazosin  (CARDURA ) 1 MG tablet Take 2 tablets (2 mg total) by mouth daily. 08/28/23   Thapa, [place], MD  eplerenone  (INSPRA ) 50 MG tablet Take 1 tablet (50 mg total) by mouth daily. 08/28/23   Thapa, [place], MD  hydrALAZINE  (APRESOLINE ) 10 MG tablet Take 1 tablet (10 mg total) by mouth 3 (three) times daily. 05/30/23   Thapa, [place], MD  metFORMIN (GLUCOPHAGE) 500 MG tablet Take by mouth 2 (two) times daily with a meal.    [provider]      Allergies    Patient has no known allergies.    Review of Systems   Review of Systems  Constitutional:  Negative for diaphoresis and fever.  Respiratory:  Negative for cough and shortness of breath.   Cardiovascular:  Positive for chest pain. Negative for syncope.  Gastrointestinal:  Negative for abdominal pain, nausea and vomiting.  Musculoskeletal:  Negative for back pain.  Neurological:  Negative for dizziness.    Physical Exam Updated Vital Signs BP (!) 149/97 (BP Location: Left Arm)   Pulse (!) 109   Temp 98 F (36.7 C) (Temporal)   Resp 20   Ht 6\' 1"  (1.854 m)   Wt 106.6 kg   SpO2 98%   BMI 31.00 kg/m  Physical Exam Vitals and nursing note reviewed.  Constitutional:      General: He is not in acute distress.    Appearance: He is well-developed.  HENT:  Head: Normocephalic and atraumatic.  Eyes:     Conjunctiva/sclera: Conjunctivae normal.  Cardiovascular:     Rate and Rhythm: Normal rate and regular rhythm.     Heart sounds: No murmur heard. Pulmonary:     Effort: Pulmonary effort is normal. No respiratory distress.     Breath sounds: Normal breath sounds.  Chest:     Chest wall: Tenderness (left lower ribs) present.  Abdominal:     Palpations: Abdomen is soft.     Tenderness: There is no abdominal tenderness.  Musculoskeletal:        General: No deformity.     Cervical back: Neck supple.  Skin:    General: Skin is warm and dry.     Capillary Refill: Capillary refill takes less than 2 seconds.   Neurological:     General: No focal deficit present.     Mental Status: He is alert.     Sensory: No sensory deficit.     Motor: No weakness.  Psychiatric:        Mood and Affect: Mood normal.     ED Results / Procedures / Treatments   Labs (all labs ordered are listed, but only abnormal results are displayed) Labs Reviewed  COMPREHENSIVE METABOLIC PANEL WITH GFR - Abnormal; Notable for the following components:      Result Value   CO2 19 (*)    Glucose, Bld 134 (*)    Anion gap 16 (*)    All other components within normal limits  LIPASE, BLOOD  CBC  URINALYSIS, ROUTINE W REFLEX MICROSCOPIC    EKG None  Radiology No results found.  Procedures Procedures  {Document cardiac monitor, telemetry assessment procedure when appropriate:1}  Medications Ordered in ED Medications - No data to display  ED Course/ Medical Decision Making/ A&P   {   Click here for ABCD2, HEART and other calculatorsREFRESH Note before signing :1}                              Medical Decision Making Amount and/or Complexity of Data Reviewed Labs: ordered.   This patient complains of ***; this involves an extensive number of treatment Options and is a complaint that carries with it a high risk of complications and morbidity. The differential includes ***  I ordered, reviewed and interpreted labs, which included *** I ordered medication *** and reviewed PMP when indicated. I ordered imaging studies which included *** and I independently    visualized and interpreted imaging which showed *** Additional history obtained from *** Previous records obtained and reviewed *** I consulted *** and discussed lab and imaging findings and discussed disposition.  Cardiac monitoring reviewed, *** Social determinants considered, *** Critical Interventions: ***  After the interventions stated above, I reevaluated the patient and found *** Admission and further testing considered, ***   {Document  critical care time when appropriate:1} {Document review of labs and clinical decision tools ie heart score, Chads2Vasc2 etc:1}  {Document your independent review of radiology images, and any outside records:1} {Document your discussion with family members, caretakers, and with consultants:1} {Document social determinants of health affecting pt's care:1} {Document your decision making why or why not admission, treatments were needed:1} Final Clinical Impression(s) / ED Diagnoses Final diagnoses:  None    Rx / DC Orders ED Discharge Orders     None

## 2023-10-25 NOTE — ED Triage Notes (Addendum)
 Patient arrives ambulatory to the ED with complaints of LLQ abdominal pain radiating up his left shoulder x1 day. Rates pain a 7/10.

## 2023-10-25 NOTE — Discharge Instructions (Signed)
 You are seen in the emergency department for pain in your left lower chest upper abdomen.  You had blood work EKG and a CAT scan of your chest abdomen and pelvis that did not show an obvious explanation for your symptoms.  They did see a lesion in your colon that we will need follow-up.  For now I would go with symptomatic treatment, warm compress to the area, Tylenol  and ibuprofen for pain.  Follow-up with your doctor.  Return to the emergency department if any worsening or concerning symptoms.  Included below is the impression of the CAT scan by the radiologist:  IMPRESSION:  1. No aortic aneursym, intramural hematoma, or aortic dissection.  2. 1.5 cm fat containing lesion in the proximal transverse colon  (axial 153-157). It is uncertain if this represents ingested  material or underlying colonic polyp. If not recently performed, a  nonemergent colonoscopy could be considered for further  characterization.

## 2023-10-28 ENCOUNTER — Encounter (HOSPITAL_BASED_OUTPATIENT_CLINIC_OR_DEPARTMENT_OTHER): Payer: Self-pay | Admitting: Internal Medicine

## 2023-10-28 DIAGNOSIS — G4733 Obstructive sleep apnea (adult) (pediatric): Secondary | ICD-10-CM

## 2023-12-05 ENCOUNTER — Encounter: Payer: Self-pay | Admitting: Endocrinology

## 2023-12-05 ENCOUNTER — Ambulatory Visit: Admitting: Endocrinology

## 2023-12-05 VITALS — BP 156/86 | HR 107 | Resp 16 | Ht 73.0 in | Wt 246.4 lb

## 2023-12-05 DIAGNOSIS — I1A0 Resistant hypertension: Secondary | ICD-10-CM | POA: Diagnosis not present

## 2023-12-05 NOTE — Progress Notes (Unsigned)
 Outpatient Endocrinology Note [place] Jd Mccaster, MD  12/06/23  Patient's Name: Adam Beasley    DOB: 12-Aug-1965    MRN: [id number]  REASON OF VISIT: Follow-up for resistant hypertension  REFERRING PROVIDER: Nohemi Batters, MD  PCP: Nohemi Batters, MD  HISTORY OF PRESENT ILLNESS:   Adam Beasley is a 58 y.o. old male with past medical history listed below, is here for follow-up of resistant hypertension.  Pertinent  History: Patient was diagnosed with hypertension at age of late 30s.  He was referred to endocrinology for the evaluation of resistant hypertension with concern of primary aldosteronism, was initially seen in beginning of December 2024.    Antihypertensive medications were gradually added over time.   - Referral medical record reviewed, based on office note he had aldosterone renin activity ratio of 300 in 11/2020, at that time he was on amlodipine  10 mg daily, hydrochlorothiazide 25 mg daily and losartan 100 mg daily.  He was not taking his spironolactone  and labetalol at that time, his spironolactone  was held for the test, mentioned that he had  blood pressure of 143/92 without being on the spironolactone  at that time.  - Spironolactone  was started around April 2022, initial dose of 50 mg daily.  Later in June 2022 after the aldosterone renin test completed, hydrochlorothiazide was stopped and resumed on a spironolactone .  -Labetalol was added 20 mg twice daily in December 2023.  -In February 2024, spironolactone  was increased to 100 mg daily.  At that time he was taking amlodipine  10 mg daily, labetalol 200 mg 2 times a day, losartan 100 mg daily.  Antihypertensive medications in December 2024: He reports compliance with these medications.  There were discrepancy on the medications on chart and referral records review and what the patient reported.  On May 30, 2023, current antidiabetic medication confirmed with the patient over the phone as  follows. Amlodipine  10 mg daily. Losartan 100 mg daily. Spironolactone  100 mg daily.  Labetalol 200 mg two times a day.   BP at home mostly: 140-145/90-92.  Referral medical records reviewed, plasma free metanephrines were unremarkable in 2022, in Nov 10, 2020 free metanephrines 73 with low normal limit of 67, normetanephrine free 30 with low normal limit of 148, total free metanephrines and normetanephrine 103 with low normal limit of 205.  It seems like based on records review there was plan to refer to endocrinology from 2022/2023, patient was putting off because of no insurance etc. He is referred to endocrinology after having medical insurance coverage.  He had low potassium of 3.2 in 2016.  He had normal potassium after that in 2022 however was taking spironolactone  and losartan.  He was on hydrochlorothiazide in the past as well.  Based on record he had potassium of 3.3 before taking spironolactone  in the past.  In March 2024 he had renal ultrasound Doppler with normal renal artery stenosis.  # In May 20, 2023 : Laboratory workup with mildly elevated aldosterone and normal renin, normal metanephrines and normal cortisol.  Normal potassium.  Results for hyperaldosteronism was not conclusive, results may have been affected by above-mentioned antihypertensive medications.   Latest Reference Range & Units 05/20/23 15:52  ALDOSTERONE  ng/dL 27  Cortisol, Plasma mcg/dL 9.1  Metanephrine, Pl <=65 pg/mL 42  Normetanephrine, Pl <=148 pg/mL 35  Renin Activity 0.25 - 5.82 ng/mL/h 0.46   - Hold all of his current antihypertensive medications amlodipine /labetalol/spironolactone /losartan.  # Started on mid of December 2024 -Verapamil  extended release 120 mg daily, later  increased to 180 mg daily. Hydralazine  10 mg 3 times a day. Doxazosin  1 mg daily.  # July 16, 2023 :    Latest Reference Range & Units 07/16/23 15:15  ALDOSTERONE  ng/dL 6  ALDO / PRA Ratio 0.9 - 28.9 Ratio 31.6 (H)   Renin Activity 0.25 - 5.82 ng/mL/h 0.19 (L)  (H): Data is abnormally high (L): Data is abnormally low   Latest Reference Range & Units 07/16/23 15:15  Sodium 135 - 146 mmol/L 137  Potassium 3.5 - 5.3 mmol/L 3.9  Chloride 98 - 110 mmol/L 100  CO2 20 - 32 mmol/L 27  Glucose 65 - 99 mg/dL 829 (H)  BUN 7 - 25 mg/dL 16  Creatinine 5.62 - 1.30 mg/dL 8.65  Calcium 8.6 - 78.4 mg/dL 9.3  BUN/Creatinine Ratio 6 - 22 (calc) SEE NOTE:  eGFR > OR = 60 mL/min/1.10m2 96  (H): Data is abnormally high  Aldosterone level is 6, perfectly normal.  Renin was mildly low and makes the aldosterone creatinine ratio of slightly elevated at 31.6.  With absolute value of plasma aldosterone concentration of 6, less likely to have primarily aldosteronism at least unlikely to have surgically curable hyperaldosteronism.  No additional plan for workup or laboratory test for aldosteronism at this time.  Will treat with medical therapy.  Spironolactone  was resumed, continue doxazosin .  Advised to stop verapamil  and hydralazine .  Spironolactone  was switched to eplerenone  due to potential concern of gynecomastia in March 2025.  Interval history  Patient is currently taking amlodipine  5 mg daily, doxazosin  2 mg daily in April around 50 mg daily.  Patient reports his blood pressure at home has been 140/85 range.  Blood pressure in the clinic today mildly elevated.  He is asymptomatic.  He reports compliance with blood pressure medication.  He is no longer taking hydralazine  and verapamil .  Patient did not bring blood pressure log as advised in the clinic visit.  Amlodipoine 5mg  daily. Doxazosin  2mg  daily. Eplerenone  50mg  daily  No other complaints today.   REVIEW OF SYSTEMS:  As per history of present illness.   PAST MEDICAL HISTORY: Past Medical History:  Diagnosis Date   Diabetes mellitus without complication (HCC)    Hyperlipidemia    Hypertension     PAST SURGICAL HISTORY: History reviewed. No pertinent  surgical history.  ALLERGIES: No Known Allergies  FAMILY HISTORY:  History reviewed. No pertinent family history.  SOCIAL HISTORY: Social History   Socioeconomic History   Marital status: Single    Spouse name: Not on file   Number of children: Not on file   Years of education: Not on file   Highest education level: Not on file  Occupational History   Not on file  Tobacco Use   Smoking status: Never   Smokeless tobacco: Never  Substance and Sexual Activity   Alcohol use: No   Drug use: Not on file   Sexual activity: Not on file  Other Topics Concern   Not on file  Social History Narrative   Not on file   Social Drivers of Health   Financial Resource Strain: Not on file  Food Insecurity: Not on file  Transportation Needs: Not on file  Physical Activity: Not on file  Stress: Not on file  Social Connections: Not on file    MEDICATIONS:  Current Outpatient Medications  Medication Sig Dispense Refill   amLODipine  (NORVASC ) 5 MG tablet Take 1 tablet (5 mg total) by mouth daily. 90 tablet 3   atorvastatin (  LIPITOR) 40 MG tablet Take 40 mg by mouth daily.     doxazosin  (CARDURA ) 1 MG tablet Take 2 tablets (2 mg total) by mouth daily. 180 tablet 3   eplerenone  (INSPRA ) 50 MG tablet Take 1 tablet (50 mg total) by mouth daily. 90 tablet 3   metFORMIN (GLUCOPHAGE) 500 MG tablet Take by mouth 2 (two) times daily with a meal.     azithromycin  (ZITHROMAX ) 250 MG tablet Take 1 tablet (250 mg total) by mouth daily. 4 tablet 0   chlorpheniramine-HYDROcodone (TUSSIONEX PENNKINETIC ER) 10-8 MG/5ML LQCR Take 5 mLs by mouth 2 (two) times daily. 90 mL 0   hydrALAZINE  (APRESOLINE ) 10 MG tablet Take 1 tablet (10 mg total) by mouth 3 (three) times daily. 90 tablet 11   No current facility-administered medications for this visit.    PHYSICAL EXAM: Vitals:   12/05/23 1547 12/05/23 1550  BP: (!) 156/98 (!) 156/86  Pulse: (!) 107   Resp: 16   SpO2: 98%   Weight: 246 lb 6.4 oz (111.8  kg)   Height: 6' 1 (1.854 m)      Body mass index is 32.51 kg/m.  Wt Readings from Last 3 Encounters:  12/05/23 246 lb 6.4 oz (111.8 kg)  10/25/23 235 lb (106.6 kg)  08/28/23 245 lb (111.1 kg)    General: Well developed, well nourished male in no apparent distress.  HEENT: AT/[place], no external lesions. Hearing intact to the spoken word Eyes: EOMI. Conjunctiva clear and no icterus. Neck: Trachea midline, neck supple without appreciable thyromegaly or lymphadenopathy and no palpable thyroid nodules Lungs: Clear to auscultation, no wheeze. Respirations not labored Heart: S1S2, Regular in rate and rhythm.  Abdomen: Soft, non tender Neurologic: Alert, oriented, normal speech, deep tendon biceps reflexes normal,  no gross focal neurological deficit Extremities: No pedal pitting edema, no tremors of outstretched hands Skin: Warm, color good. Psychiatric: Does not appear depressed or anxious  PERTINENT HISTORIC LABORATORY AND IMAGING STUDIES:  All pertinent laboratory results were reviewed. Please see HPI for further details.  Lab Results  Component Value Date   CO2 19 (L) 10/25/2023   CL 101 10/25/2023   NA 137 10/25/2023   GLUCOSE 134 (H) 10/25/2023   BUN 11 10/25/2023   No components found for: CORTRAND, CORTISOL TOTAL AM, ALDOSTERONE, RENIN ACTIVITY, DEHYDROEPIANDROSTERONE SULFATE, CATECHOLAMINES FRACTIONATED     ASSESSMENT / PLAN  1. Resistant hypertension    -Patient had resistant hypertension requiring at least 4 antihypertensive medications.  Patient was being evaluated with a concern of primary aldosteronism.  Laboratory evaluation in December 2024 with elevated aldosterone of 27,renin normal 0.43, normal potassium however was taking spironolactone , losartan, labetalol, and amlodipine .   -Laboratory evaluation in the January 2025 with aldosterone of 6, mildly low renin max mildly elevated aldosterone renin ratio, no hyperkalemia.  Tests were completed with  medication adjustment.  Overall less likely to be related to primary aldosteronism/at least unlikely to be related to surgically curable hyperaldosteronism.  We discussed with patient decided to manage medically. -Blood pressure is still mildly high in the clinic today.  He reports that at home his blood pressure is in the range of 140/85.  No blood pressure log to review.  ?  Questionable compliance. - He has been on 3 antihypertensive medication at this time amlodipine , doxazosin  and eplerenone .  He reports blood pressure has been controlled at home. - Spironolactone  was switched to eplerenone  due to concern of potential gynecomastia with the spironolactone  anticipate requiring this medication for  long-term.  Plan: -Continue current dose of amlodipine  5 mg daily, doxazosin  2 mg daily and eplerenone  5 mg daily. -No routine endocrinology follow-up is required.  He will continue to follow-up with primary care provider for blood pressure management.   # Patient expressed symptoms of snoring, apnea during sleep concerning for obstructive sleep apnea, patient is asked to discuss with primary care provider for sleep study.  Reports he has plan for sleep study.  Diagnoses and all orders for this visit:  Resistant hypertension    DISPOSITION Follow up in clinic in PRN suggested.Encouraged to call our clinic with any questions.  All questions answered and patient verbalized understanding of the plan.  [place] Kayman Snuffer, MD [facility] End[facility] [address] [place], [address] [place], [place] [address] Phone # [phone]  At least part of this note was generated using voice recognition software. Inadvertent word errors may have occurred, which were not recognized during the proofreading process.

## 2023-12-06 ENCOUNTER — Encounter: Payer: Self-pay | Admitting: Endocrinology

## 2024-01-12 ENCOUNTER — Encounter (HOSPITAL_BASED_OUTPATIENT_CLINIC_OR_DEPARTMENT_OTHER): Payer: Self-pay

## 2024-01-12 ENCOUNTER — Other Ambulatory Visit: Payer: Self-pay

## 2024-01-12 ENCOUNTER — Emergency Department (HOSPITAL_BASED_OUTPATIENT_CLINIC_OR_DEPARTMENT_OTHER)

## 2024-01-12 ENCOUNTER — Inpatient Hospital Stay (HOSPITAL_BASED_OUTPATIENT_CLINIC_OR_DEPARTMENT_OTHER)
Admission: EM | Admit: 2024-01-12 | Discharge: 2024-01-20 | DRG: 871 | Disposition: A | Discharge status: Home / Self Care | Attending: Internal Medicine | Admitting: Internal Medicine

## 2024-01-12 DIAGNOSIS — J189 Pneumonia, unspecified organism: Secondary | ICD-10-CM | POA: Diagnosis not present

## 2024-01-12 DIAGNOSIS — A481 Legionnaires' disease: Secondary | ICD-10-CM | POA: Diagnosis not present

## 2024-01-12 DIAGNOSIS — M6282 Rhabdomyolysis: Secondary | ICD-10-CM | POA: Diagnosis not present

## 2024-01-12 DIAGNOSIS — E1165 Type 2 diabetes mellitus with hyperglycemia: Secondary | ICD-10-CM | POA: Diagnosis not present

## 2024-01-12 DIAGNOSIS — K760 Fatty (change of) liver, not elsewhere classified: Secondary | ICD-10-CM | POA: Diagnosis present

## 2024-01-12 DIAGNOSIS — Z7984 Long term (current) use of oral hypoglycemic drugs: Secondary | ICD-10-CM | POA: Diagnosis not present

## 2024-01-12 DIAGNOSIS — G4089 Other seizures: Secondary | ICD-10-CM | POA: Diagnosis not present

## 2024-01-12 DIAGNOSIS — R569 Unspecified convulsions: Secondary | ICD-10-CM

## 2024-01-12 DIAGNOSIS — R000 Tachycardia, unspecified: Secondary | ICD-10-CM | POA: Diagnosis not present

## 2024-01-12 DIAGNOSIS — S2232XD Fracture of one rib, left side, subsequent encounter for fracture with routine healing: Secondary | ICD-10-CM | POA: Diagnosis not present

## 2024-01-12 DIAGNOSIS — Z1152 Encounter for screening for COVID-19: Secondary | ICD-10-CM

## 2024-01-12 DIAGNOSIS — E871 Hypo-osmolality and hyponatremia: Secondary | ICD-10-CM | POA: Diagnosis present

## 2024-01-12 DIAGNOSIS — M47816 Spondylosis without myelopathy or radiculopathy, lumbar region: Secondary | ICD-10-CM | POA: Diagnosis present

## 2024-01-12 DIAGNOSIS — J9601 Acute respiratory failure with hypoxia: Secondary | ICD-10-CM | POA: Diagnosis present

## 2024-01-12 DIAGNOSIS — R6520 Severe sepsis without septic shock: Secondary | ICD-10-CM | POA: Diagnosis not present

## 2024-01-12 DIAGNOSIS — R7401 Elevation of levels of liver transaminase levels: Secondary | ICD-10-CM | POA: Diagnosis not present

## 2024-01-12 DIAGNOSIS — R197 Diarrhea, unspecified: Secondary | ICD-10-CM | POA: Diagnosis not present

## 2024-01-12 DIAGNOSIS — I10 Essential (primary) hypertension: Secondary | ICD-10-CM | POA: Diagnosis present

## 2024-01-12 DIAGNOSIS — E876 Hypokalemia: Secondary | ICD-10-CM | POA: Diagnosis present

## 2024-01-12 DIAGNOSIS — N170 Acute kidney failure with tubular necrosis: Secondary | ICD-10-CM | POA: Diagnosis present

## 2024-01-12 DIAGNOSIS — Z79899 Other long term (current) drug therapy: Secondary | ICD-10-CM

## 2024-01-12 DIAGNOSIS — T39395A Adverse effect of other nonsteroidal anti-inflammatory drugs [NSAID], initial encounter: Secondary | ICD-10-CM | POA: Diagnosis not present

## 2024-01-12 DIAGNOSIS — D170 Benign lipomatous neoplasm of skin and subcutaneous tissue of head, face and neck: Secondary | ICD-10-CM | POA: Diagnosis present

## 2024-01-12 DIAGNOSIS — E8721 Acute metabolic acidosis: Secondary | ICD-10-CM

## 2024-01-12 DIAGNOSIS — R112 Nausea with vomiting, unspecified: Secondary | ICD-10-CM | POA: Diagnosis present

## 2024-01-12 DIAGNOSIS — E872 Acidosis, unspecified: Secondary | ICD-10-CM | POA: Diagnosis present

## 2024-01-12 DIAGNOSIS — R059 Cough, unspecified: Secondary | ICD-10-CM | POA: Diagnosis not present

## 2024-01-12 DIAGNOSIS — E7800 Pure hypercholesterolemia, unspecified: Secondary | ICD-10-CM | POA: Diagnosis not present

## 2024-01-12 DIAGNOSIS — K7200 Acute and subacute hepatic failure without coma: Secondary | ICD-10-CM | POA: Diagnosis not present

## 2024-01-12 DIAGNOSIS — N179 Acute kidney failure, unspecified: Secondary | ICD-10-CM

## 2024-01-12 DIAGNOSIS — I5021 Acute systolic (congestive) heart failure: Secondary | ICD-10-CM | POA: Diagnosis not present

## 2024-01-12 DIAGNOSIS — R918 Other nonspecific abnormal finding of lung field: Secondary | ICD-10-CM | POA: Diagnosis not present

## 2024-01-12 DIAGNOSIS — K719 Toxic liver disease, unspecified: Secondary | ICD-10-CM | POA: Diagnosis not present

## 2024-01-12 DIAGNOSIS — A419 Sepsis, unspecified organism: Secondary | ICD-10-CM | POA: Diagnosis not present

## 2024-01-12 DIAGNOSIS — E119 Type 2 diabetes mellitus without complications: Secondary | ICD-10-CM | POA: Diagnosis not present

## 2024-01-12 DIAGNOSIS — R5381 Other malaise: Secondary | ICD-10-CM | POA: Diagnosis present

## 2024-01-12 DIAGNOSIS — E860 Dehydration: Secondary | ICD-10-CM | POA: Diagnosis not present

## 2024-01-12 LAB — PRO BRAIN NATRIURETIC PEPTIDE: Pro Brain Natriuretic Peptide: 528 pg/mL — ABNORMAL HIGH (ref <300.0)

## 2024-01-12 LAB — URINALYSIS, W/ REFLEX TO CULTURE (INFECTION SUSPECTED)
Glucose, UA: 250 mg/dL — AB
Ketones, ur: NEGATIVE mg/dL
Leukocytes,Ua: NEGATIVE
Nitrite: NEGATIVE
Protein, ur: >=300 mg/dL — AB
Specific Gravity, Urine: >=1.03 (ref 1.005–1.030)
pH: 5 (ref 5.0–8.0)

## 2024-01-12 LAB — CBC WITH DIFFERENTIAL/PLATELET
Abs Immature Granulocytes: 0.12 K/uL — ABNORMAL HIGH (ref 0.00–0.07)
Basophils Absolute: 0 K/uL (ref 0.0–0.1)
Basophils Relative: 0 %
Eosinophils Absolute: 0 K/uL (ref 0.0–0.5)
Eosinophils Relative: 0 %
HCT: 32.5 % — ABNORMAL LOW (ref 39.0–52.0)
Hemoglobin: 11.7 g/dL — ABNORMAL LOW (ref 13.0–17.0)
Immature Granulocytes: 2 %
Lymphocytes Relative: 11 %
Lymphs Abs: 0.8 K/uL (ref 0.7–4.0)
MCH: 29.4 pg (ref 26.0–34.0)
MCHC: 36 g/dL (ref 30.0–36.0)
MCV: 81.7 fL (ref 80.0–100.0)
Monocytes Absolute: 0.7 K/uL (ref 0.1–1.0)
Monocytes Relative: 10 %
Neutro Abs: 5.8 K/uL (ref 1.7–7.7)
Neutrophils Relative %: 77 %
Platelets: 177 K/uL (ref 150–400)
RBC: 3.98 MIL/uL — ABNORMAL LOW (ref 4.22–5.81)
RDW: 13.4 % (ref 11.5–15.5)
WBC: 7.5 K/uL (ref 4.0–10.5)
nRBC: 0 % (ref 0.0–0.2)

## 2024-01-12 LAB — I-STAT ARTERIAL BLOOD GAS, ED
Acid-base deficit: 10 mmol/L — ABNORMAL HIGH (ref 0.0–2.0)
Bicarbonate: 15.1 mmol/L — ABNORMAL LOW (ref 20.0–28.0)
Calcium, Ion: 1.05 mmol/L — ABNORMAL LOW (ref 1.15–1.40)
HCT: 30 % — ABNORMAL LOW (ref 39.0–52.0)
Hemoglobin: 10.2 g/dL — ABNORMAL LOW (ref 13.0–17.0)
O2 Saturation: 92 %
Patient temperature: 101.6
Potassium: 3.8 mmol/L (ref 3.5–5.1)
Sodium: 131 mmol/L — ABNORMAL LOW (ref 135–145)
TCO2: 16 mmol/L — ABNORMAL LOW (ref 22–32)
pCO2 arterial: 30.9 mmHg — ABNORMAL LOW (ref 32–48)
pH, Arterial: 7.305 — ABNORMAL LOW (ref 7.35–7.45)
pO2, Arterial: 76 mmHg — ABNORMAL LOW (ref 83–108)

## 2024-01-12 LAB — HEPATITIS PANEL, ACUTE
HCV Ab: NONREACTIVE
Hep A IgM: NONREACTIVE
Hep B C IgM: NONREACTIVE
Hepatitis B Surface Ag: NONREACTIVE

## 2024-01-12 LAB — I-STAT VENOUS BLOOD GAS, ED
Acid-base deficit: 8 mmol/L — ABNORMAL HIGH (ref 0.0–2.0)
Bicarbonate: 15.6 mmol/L — ABNORMAL LOW (ref 20.0–28.0)
Calcium, Ion: 0.97 mmol/L — ABNORMAL LOW (ref 1.15–1.40)
HCT: 33 % — ABNORMAL LOW (ref 39.0–52.0)
Hemoglobin: 11.2 g/dL — ABNORMAL LOW (ref 13.0–17.0)
O2 Saturation: 87 %
Patient temperature: 101.6
Potassium: 3.6 mmol/L (ref 3.5–5.1)
Sodium: 130 mmol/L — ABNORMAL LOW (ref 135–145)
TCO2: 16 mmol/L — ABNORMAL LOW (ref 22–32)
pCO2, Ven: 27 mmHg — ABNORMAL LOW (ref 44–60)
pH, Ven: 7.376 (ref 7.25–7.43)
pO2, Ven: 57 mmHg — ABNORMAL HIGH (ref 32–45)

## 2024-01-12 LAB — GAMMA GT: GGT: 52 U/L — ABNORMAL HIGH (ref 7–50)

## 2024-01-12 LAB — HIV ANTIBODY (ROUTINE TESTING W REFLEX): HIV Screen 4th Generation wRfx: NONREACTIVE

## 2024-01-12 LAB — BASIC METABOLIC PANEL WITH GFR
Anion gap: 20 — ABNORMAL HIGH (ref 5–15)
BUN: 51 mg/dL — ABNORMAL HIGH (ref 6–20)
CO2: 14 mmol/L — ABNORMAL LOW (ref 22–32)
Calcium: 7.9 mg/dL — ABNORMAL LOW (ref 8.9–10.3)
Chloride: 95 mmol/L — ABNORMAL LOW (ref 98–111)
Creatinine, Ser: 8.81 mg/dL — ABNORMAL HIGH (ref 0.61–1.24)
GFR, Estimated: 6 mL/min — ABNORMAL LOW (ref >60)
Glucose, Bld: 305 mg/dL — ABNORMAL HIGH (ref 70–99)
Potassium: 3.7 mmol/L (ref 3.5–5.1)
Sodium: 129 mmol/L — ABNORMAL LOW (ref 135–145)

## 2024-01-12 LAB — C-REACTIVE PROTEIN: CRP: 31.7 mg/dL — ABNORMAL HIGH (ref <1.0)

## 2024-01-12 LAB — COMPREHENSIVE METABOLIC PANEL WITH GFR
ALT: 263 U/L — ABNORMAL HIGH (ref 0–44)
AST: 834 U/L — ABNORMAL HIGH (ref 15–41)
Albumin: 3.6 g/dL (ref 3.5–5.0)
Alkaline Phosphatase: 78 U/L (ref 38–126)
Anion gap: 18 — ABNORMAL HIGH (ref 5–15)
BUN: 51 mg/dL — ABNORMAL HIGH (ref 6–20)
CO2: 17 mmol/L — ABNORMAL LOW (ref 22–32)
Calcium: 8.6 mg/dL — ABNORMAL LOW (ref 8.9–10.3)
Chloride: 91 mmol/L — ABNORMAL LOW (ref 98–111)
Creatinine, Ser: 8.69 mg/dL — ABNORMAL HIGH (ref 0.61–1.24)
GFR, Estimated: 7 mL/min — ABNORMAL LOW (ref >60)
Glucose, Bld: 328 mg/dL — ABNORMAL HIGH (ref 70–99)
Potassium: 3.6 mmol/L (ref 3.5–5.1)
Sodium: 127 mmol/L — ABNORMAL LOW (ref 135–145)
Total Bilirubin: 2.2 mg/dL — ABNORMAL HIGH (ref 0.0–1.2)
Total Protein: 7.5 g/dL (ref 6.5–8.1)

## 2024-01-12 LAB — HEPATIC FUNCTION PANEL
ALT: 264 U/L — ABNORMAL HIGH (ref 0–44)
AST: 830 U/L — ABNORMAL HIGH (ref 15–41)
Albumin: 3.6 g/dL (ref 3.5–5.0)
Alkaline Phosphatase: 77 U/L (ref 38–126)
Bilirubin, Direct: 1.4 mg/dL — ABNORMAL HIGH (ref 0.0–0.2)
Indirect Bilirubin: 0.8 mg/dL (ref 0.3–0.9)
Total Bilirubin: 2.1 mg/dL — ABNORMAL HIGH (ref 0.0–1.2)
Total Protein: 7.5 g/dL (ref 6.5–8.1)

## 2024-01-12 LAB — LACTIC ACID, PLASMA
Lactic Acid, Venous: 1.6 mmol/L (ref 0.5–1.9)
Lactic Acid, Venous: 1.6 mmol/L (ref 0.5–1.9)

## 2024-01-12 LAB — PROTIME-INR
INR: 1.1 (ref 0.8–1.2)
Prothrombin Time: 14.8 s (ref 11.4–15.2)

## 2024-01-12 LAB — HEMOGLOBIN A1C
Hgb A1c MFr Bld: 7.6 % — ABNORMAL HIGH (ref 4.8–5.6)
Mean Plasma Glucose: 171.42 mg/dL

## 2024-01-12 LAB — BETA-HYDROXYBUTYRIC ACID: Beta-Hydroxybutyric Acid: 0.26 mmol/L (ref 0.05–0.27)

## 2024-01-12 LAB — SEDIMENTATION RATE: Sed Rate: 79 mm/h — ABNORMAL HIGH (ref 0–16)

## 2024-01-12 LAB — RESP PANEL BY RT-PCR (RSV, FLU A&B, COVID)  RVPGX2
Influenza A by PCR: NEGATIVE
Influenza B by PCR: NEGATIVE
Resp Syncytial Virus by PCR: NEGATIVE
SARS Coronavirus 2 by RT PCR: NEGATIVE

## 2024-01-12 LAB — CK: Total CK: 13277 U/L — ABNORMAL HIGH (ref 49–397)

## 2024-01-12 LAB — LIPASE, BLOOD: Lipase: 163 U/L — ABNORMAL HIGH (ref 11–51)

## 2024-01-12 LAB — MAGNESIUM: Magnesium: 2 mg/dL (ref 1.7–2.4)

## 2024-01-12 LAB — PROCALCITONIN: Procalcitonin: 17.42 ng/mL

## 2024-01-12 LAB — LACTATE DEHYDROGENASE: LDH: 1539 U/L — ABNORMAL HIGH (ref 98–192)

## 2024-01-12 LAB — GLUCOSE, CAPILLARY
Glucose-Capillary: 281 mg/dL — ABNORMAL HIGH (ref 70–99)
Glucose-Capillary: 332 mg/dL — ABNORMAL HIGH (ref 70–99)

## 2024-01-12 LAB — ACETAMINOPHEN LEVEL: Acetaminophen (Tylenol), Serum: <10 ug/mL — ABNORMAL LOW (ref 10–30)

## 2024-01-12 LAB — SALICYLATE LEVEL: Salicylate Lvl: <7 mg/dL — ABNORMAL LOW (ref 7.0–30.0)

## 2024-01-12 LAB — ETHANOL: Alcohol, Ethyl (B): <15 mg/dL (ref <15)

## 2024-01-12 MED ORDER — SODIUM CHLORIDE 0.9 % IV SOLN
500.0000 [IU]/h | INTRAVENOUS | Status: DC
  Administered 2024-01-13 (×2): 500 [IU]/h via INTRAVENOUS_CENTRAL
  Filled 2024-01-12: qty 2
  Filled 2024-01-12: qty 10000

## 2024-01-12 MED ORDER — ORAL CARE MOUTH RINSE: 15.0000 mL | OROMUCOSAL | Status: DC | PRN

## 2024-01-12 MED ORDER — SODIUM CHLORIDE 0.9 % IV BOLUS (SEPSIS)
1000.0000 mL | Freq: Once | INTRAVENOUS | Status: AC
  Administered 2024-01-12: 1000 mL via INTRAVENOUS

## 2024-01-12 MED ORDER — SODIUM CHLORIDE 0.9 % IV SOLN
2.0000 g | Freq: Once | INTRAVENOUS | Status: AC
  Administered 2024-01-12: 2 g via INTRAVENOUS
  Filled 2024-01-12: qty 20

## 2024-01-12 MED ORDER — PRISMASOL BGK 4/2.5 32-4-2.5 MEQ/L EC SOLN: Status: DC

## 2024-01-12 MED ORDER — LACTATED RINGERS IV SOLN
150.0000 mL/h | INTRAVENOUS | Status: DC
  Administered 2024-01-12: 150 mL/h via INTRAVENOUS

## 2024-01-12 MED ORDER — SODIUM CHLORIDE 0.9 % IV SOLN: Freq: Once | INTRAVENOUS | Status: AC

## 2024-01-12 MED ORDER — LORAZEPAM 2 MG/ML IJ SOLN
INTRAMUSCULAR | Status: AC
  Administered 2024-01-12: 2 mg
  Filled 2024-01-12: qty 1

## 2024-01-12 MED ORDER — ACETAMINOPHEN 325 MG PO TABS: 650.0000 mg | ORAL_TABLET | Freq: Once | ORAL | Status: DC | PRN

## 2024-01-12 MED ORDER — SODIUM CHLORIDE 0.9 % IV BOLUS
1000.0000 mL | Freq: Once | INTRAVENOUS | Status: AC
  Administered 2024-01-12: 1000 mL via INTRAVENOUS

## 2024-01-12 MED ORDER — SODIUM CHLORIDE 0.9 % IV SOLN: 2.0000 g | INTRAVENOUS | Status: DC

## 2024-01-12 MED ORDER — SODIUM CHLORIDE 0.9 % IV SOLN: INTRAVENOUS | Status: DC

## 2024-01-12 MED ORDER — INSULIN ASPART 100 UNIT/ML IJ SOLN
0.0000 [IU] | INTRAMUSCULAR | Status: DC
  Administered 2024-01-12: 5 [IU] via SUBCUTANEOUS
  Administered 2024-01-12: 3 [IU] via SUBCUTANEOUS
  Administered 2024-01-13 (×2): 1 [IU] via SUBCUTANEOUS
  Administered 2024-01-13: 4 [IU] via SUBCUTANEOUS
  Administered 2024-01-13: 1 [IU] via SUBCUTANEOUS
  Administered 2024-01-14: 2 [IU] via SUBCUTANEOUS
  Administered 2024-01-14 (×3): 1 [IU] via SUBCUTANEOUS
  Administered 2024-01-15 (×2): 2 [IU] via SUBCUTANEOUS
  Administered 2024-01-15: 1 [IU] via SUBCUTANEOUS

## 2024-01-12 MED ORDER — MORPHINE SULFATE (PF) 2 MG/ML IV SOLN
2.0000 mg | INTRAVENOUS | Status: DC | PRN
  Administered 2024-01-13: 2 mg via INTRAVENOUS
  Filled 2024-01-12: qty 1

## 2024-01-12 MED ORDER — ACETAMINOPHEN 500 MG PO TABS
1000.0000 mg | ORAL_TABLET | Freq: Once | ORAL | Status: AC
  Administered 2024-01-12: 1000 mg via ORAL
  Filled 2024-01-12: qty 2

## 2024-01-12 MED ORDER — METRONIDAZOLE 500 MG/100ML IV SOLN
500.0000 mg | Freq: Two times a day (BID) | INTRAVENOUS | Status: DC
  Administered 2024-01-12: 500 mg via INTRAVENOUS
  Filled 2024-01-12: qty 100

## 2024-01-12 MED ORDER — HEPARIN SODIUM (PORCINE) 5000 UNIT/ML IJ SOLN
5000.0000 [IU] | Freq: Two times a day (BID) | INTRAMUSCULAR | Status: DC
  Administered 2024-01-13 (×2): 5000 [IU] via SUBCUTANEOUS
  Filled 2024-01-12 (×3): qty 1

## 2024-01-12 MED ORDER — ONDANSETRON HCL 4 MG/2ML IJ SOLN
4.0000 mg | Freq: Once | INTRAMUSCULAR | Status: AC
  Administered 2024-01-12: 4 mg via INTRAVENOUS
  Filled 2024-01-12: qty 2

## 2024-01-12 MED ORDER — CHLORHEXIDINE GLUCONATE CLOTH 2 % EX PADS
6.0000 | MEDICATED_PAD | Freq: Every day | CUTANEOUS | Status: DC
  Administered 2024-01-12 – 2024-01-20 (×9): 6 via TOPICAL

## 2024-01-12 MED ORDER — FAMOTIDINE IN NACL 20-0.9 MG/50ML-% IV SOLN
20.0000 mg | Freq: Two times a day (BID) | INTRAVENOUS | Status: DC
  Administered 2024-01-12 (×2): 20 mg via INTRAVENOUS
  Filled 2024-01-12 (×3): qty 50

## 2024-01-12 MED ORDER — HEPARIN SODIUM (PORCINE) 1000 UNIT/ML DIALYSIS
1000.0000 [IU] | INTRAMUSCULAR | Status: DC | PRN
  Administered 2024-01-14: 3000 [IU] via INTRAVENOUS_CENTRAL
  Filled 2024-01-12: qty 6
  Filled 2024-01-12: qty 3

## 2024-01-12 MED ORDER — SODIUM CHLORIDE 0.9 % IV SOLN
2.0000 g | INTRAVENOUS | Status: AC
  Administered 2024-01-13 – 2024-01-16 (×4): 2 g via INTRAVENOUS
  Filled 2024-01-12 (×4): qty 20

## 2024-01-12 MED ORDER — SODIUM CHLORIDE 0.9 % IV SOLN
500.0000 mg | INTRAVENOUS | Status: DC
  Filled 2024-01-12: qty 5

## 2024-01-12 MED ORDER — HYDRALAZINE HCL 20 MG/ML IJ SOLN
10.0000 mg | Freq: Four times a day (QID) | INTRAMUSCULAR | Status: DC | PRN
  Administered 2024-01-14 (×2): 10 mg via INTRAVENOUS
  Filled 2024-01-12 (×2): qty 1

## 2024-01-12 MED ORDER — SODIUM CHLORIDE 0.9 % IV SOLN
500.0000 mg | Freq: Once | INTRAVENOUS | Status: AC
  Administered 2024-01-12: 500 mg via INTRAVENOUS
  Filled 2024-01-12: qty 5

## 2024-01-12 MED ORDER — LORAZEPAM 2 MG/ML IJ SOLN: 2.0000 mg | Freq: Once | INTRAMUSCULAR | Status: DC

## 2024-01-12 NOTE — ED Notes (Signed)
 Called [facility] for transport.

## 2024-01-12 NOTE — ED Provider Notes (Signed)
 [place] [facility] Provider Note   CSN: [id number] Arrival date & time: 01/12/24  9060     Patient presents with: Cough   Adam Beasley is a 58 y.o. male.   Patient here with cough diarrhea nausea vomiting for 4 days.  Endorses shortness of breath and fatigue.  History of diabetes hypertension high cholesterol.  Nothing makes it worse or better.  Denies any abdominal pain.  Denies any headache or neck pain.  The history is provided by the patient.       Prior to Admission medications   Medication Sig Start Date End Date Taking? Authorizing Provider  amLODipine  (NORVASC ) 5 MG tablet Take 1 tablet (5 mg total) by mouth daily. 08/29/23   Thapa, [place], MD  atorvastatin (LIPITOR) 40 MG tablet Take 40 mg by mouth daily.    [provider]  azithromycin  (ZITHROMAX ) 250 MG tablet Take 1 tablet (250 mg total) by mouth daily. 07/15/14   Terryl Kubas, NP  chlorpheniramine-HYDROcodone [facility] PENNKINETIC ER) 10-8 MG/5ML LQCR Take 5 mLs by mouth 2 (two) times daily. 07/15/14   Terryl Kubas, NP  doxazosin  (CARDURA ) 1 MG tablet Take 2 tablets (2 mg total) by mouth daily. 08/28/23   Thapa, [place], MD  eplerenone  (INSPRA ) 50 MG tablet Take 1 tablet (50 mg total) by mouth daily. 08/28/23   Thapa, [place], MD  metFORMIN (GLUCOPHAGE) 500 MG tablet Take by mouth 2 (two) times daily with a meal.    [provider]    Allergies: Patient has no known allergies.    Review of Systems  Updated Vital Signs BP 118/84   Pulse 97   Temp 98.9 F (37.2 C) (Oral)   Resp (!) 28   SpO2 95%   Physical Exam Vitals and nursing note reviewed.  Constitutional:      General: He is not in acute distress.    Appearance: He is well-developed. He is not ill-appearing.  HENT:     Head: Normocephalic and atraumatic.     Nose: Congestion present.     Mouth/Throat:     Mouth: Mucous membranes are moist.  Eyes:     Extraocular Movements: Extraocular movements  intact.     Conjunctiva/sclera: Conjunctivae normal.     Pupils: Pupils are equal, round, and reactive to light.  Cardiovascular:     Rate and Rhythm: Regular rhythm. Tachycardia present.     Heart sounds: No murmur heard. Pulmonary:     Effort: Pulmonary effort is normal. No respiratory distress.     Breath sounds: Normal breath sounds.  Abdominal:     General: Abdomen is flat.     Palpations: Abdomen is soft.     Tenderness: There is no abdominal tenderness.  Musculoskeletal:        General: No swelling.     Cervical back: Normal range of motion and neck supple.  Skin:    General: Skin is warm and dry.     Capillary Refill: Capillary refill takes less than 2 seconds.  Neurological:     General: No focal deficit present.     Mental Status: He is alert and oriented to person, place, and time.     Cranial Nerves: No cranial nerve deficit.     Sensory: No sensory deficit.     Motor: No weakness.     Coordination: Coordination normal.  Psychiatric:        Mood and Affect: Mood normal.     (all labs ordered are  listed, but only abnormal results are displayed) Labs Reviewed  COMPREHENSIVE METABOLIC PANEL WITH GFR - Abnormal; Notable for the following components:      Result Value   Sodium 127 (*)    Chloride 91 (*)    CO2 17 (*)    Glucose, Bld 328 (*)    BUN 51 (*)    Creatinine, Ser 8.69 (*)    Calcium 8.6 (*)    AST 834 (*)    ALT 263 (*)    Total Bilirubin 2.2 (*)    GFR, Estimated 7 (*)    Anion gap 18 (*)    All other components within normal limits  CBC WITH DIFFERENTIAL/PLATELET - Abnormal; Notable for the following components:   RBC 3.98 (*)    Hemoglobin 11.7 (*)    HCT 32.5 (*)    Abs Immature Granulocytes 0.12 (*)    All other components within normal limits  I-STAT VENOUS BLOOD GAS, ED - Abnormal; Notable for the following components:   pCO2, Ven 27.0 (*)    pO2, Ven 57 (*)    Bicarbonate 15.6 (*)    TCO2 16 (*)    Acid-base deficit 8.0 (*)     Sodium 130 (*)    Calcium, Ion 0.97 (*)    HCT 33.0 (*)    Hemoglobin 11.2 (*)    All other components within normal limits  RESP PANEL BY RT-PCR (RSV, FLU A&B, COVID)  RVPGX2  CULTURE, BLOOD (ROUTINE X 2)  CULTURE, BLOOD (ROUTINE X 2)  LACTIC ACID, PLASMA  PROTIME-INR  URINALYSIS, W/ REFLEX TO CULTURE (INFECTION SUSPECTED)  HEPATITIS PANEL, ACUTE  BETA-HYDROXYBUTYRIC ACID  BLOOD GAS, VENOUS    EKG: EKG Interpretation Date/Time:  Sunday January 12 2024 10:00:26 EDT Ventricular Rate:  119 PR Interval:  103 QRS Duration:  93 QT Interval:  297 QTC Calculation: 418 R Axis:   42  Text Interpretation: Sinus tachycardia Atrial premature complex Consider right atrial enlargement Consider left ventricular hypertrophy Confirmed by Ruthe Cornet [phone]) on 01/12/2024 10:01:45 AM  Radiology: CT CHEST ABDOMEN PELVIS WO CONTRAST Result Date: 01/12/2024 CLINICAL DATA:  Sepsis EXAM: CT CHEST, ABDOMEN AND PELVIS WITHOUT CONTRAST TECHNIQUE: Multidetector CT imaging of the chest, abdomen and pelvis was performed following the standard protocol without IV contrast. RADIATION DOSE REDUCTION: This exam was performed according to the departmental dose-optimization program which includes automated exposure control, adjustment of the mA and/or kV according to patient size and/or use of iterative reconstruction technique. COMPARISON:  10/25/2023 FINDINGS: CT CHEST FINDINGS Cardiovascular: Mild cardiomegaly. Minimal atheromatous vascular calcification of the thoracic aorta. Mediastinum/Nodes: Likely reactive prevascular node 1.0 cm in short axis on image 20 series 301. Lungs/Pleura: New consolidation in a substantial portion of the left upper lobe and in the superior segment left lower lobe favoring multilobar pneumonia. Associated air bronchograms without mass effect on the associated airways. Small left pleural effusion. Musculoskeletal: Subacute healing left anterior eighth rib fracture on image 99 series 302, not  present/perceptible on 10/25/2023. Thoracic spondylosis. Mild bilateral gynecomastia. CT ABDOMEN PELVIS FINDINGS Hepatobiliary: Diffuse hepatic steatosis. No focal hepatic lesion identified on today's noncontrast exam. Pancreas: Unremarkable Spleen: Unremarkable Adrenals/Urinary Tract: Unremarkable Stomach/Bowel: Distal colonic and rectal air-levels favoring diarrheal process. No dilated bowel. Appendix poorly seen today. Vascular/Lymphatic: Unremarkable Reproductive: Unremarkable Other: No supplemental non-categorized findings. Musculoskeletal: Stable localized sclerosis along the iliac side of the left SI joint is probably related to arthropathy although technically nonspecific. No change from 10/25/2023. Mild lower lumbar spondylosis and degenerative disc  disease. IMPRESSION: 1. New consolidation in a substantial portion of the left upper lobe and in the superior segment left lower lobe favoring multilobar pneumonia. Associated air bronchograms without mass effect on the associated airways. Small left pleural effusion. 2. Subacute healing left anterior eighth rib fracture, not present/perceptible on 10/25/2023. 3. Diffuse hepatic steatosis. 4. Distal colonic and rectal air-levels favoring diarrheal process. 5. Mild cardiomegaly. 6. Stable localized sclerosis along the iliac side of the left SI joint is probably related to arthropathy although technically nonspecific. No change from 10/25/2023. 7. Mild lower lumbar spondylosis and degenerative disc disease. Electronically Signed   By: Ryan Salvage M.D.   On: 01/12/2024 12:16   DG Chest Portable 1 View Result Date: 01/12/2024 CLINICAL DATA:  Cough. EXAM: PORTABLE CHEST 1 VIEW COMPARISON:  10/25/2023 FINDINGS: The heart size and mediastinal contours are within normal limits. Patient is partially rotated to the left. Asymmetric opacity in the left hemithorax is probably due to overlying soft tissue attenuation, with left lung infiltrate considered less likely. No  pleural effusion. IMPRESSION: Asymmetric opacity in left hemithorax, probably due to overlying soft tissue attenuation, with left lung opacity considered less likely. Recommend routine PA and lateral chest radiographs for further evaluation. Electronically Signed   By: Norleen DELENA Kil M.D.   On: 01/12/2024 10:51     .Critical Care  Performed by: Ruthe Cornet, DO Authorized by: Ruthe Cornet, DO   Critical care provider statement:    Critical care time (minutes):  45   Critical care was necessary to treat or prevent imminent or life-threatening deterioration of the following conditions:  Dehydration, renal failure and sepsis   Critical care was time spent personally by me on the following activities:  Blood draw for specimens, development of treatment plan with patient or surrogate, discussions with consultants, discussions with primary provider, evaluation of patient's response to treatment, examination of patient, obtaining history from patient or surrogate, ordering and performing treatments and interventions, ordering and review of laboratory studies, ordering and review of radiographic studies, pulse oximetry, re-evaluation of patient's condition and review of old charts   Care discussed with: admitting provider      Medications Ordered in the ED  sodium chloride  0.9 % bolus 1,000 mL (0 mLs Intravenous Stopped 01/12/24 1159)  cefTRIAXone  (ROCEPHIN ) 2 g in sodium chloride  0.9 % 100 mL IVPB (0 g Intravenous Stopped 01/12/24 1159)  azithromycin  (ZITHROMAX ) 500 mg in sodium chloride  0.9 % 250 mL IVPB (0 mg Intravenous Stopped 01/12/24 1230)  acetaminophen  (TYLENOL ) tablet 1,000 mg (1,000 mg Oral Given 01/12/24 1006)  ondansetron  (ZOFRAN ) injection 4 mg (4 mg Intravenous Given 01/12/24 1007)  sodium chloride  0.9 % bolus 1,000 mL (1,000 mLs Intravenous New Bag/Given 01/12/24 1130)  0.9 %  sodium chloride  infusion ( Intravenous New Bag/Given 01/12/24 1221)                                    Medical  Decision Making Amount and/or Complexity of Data Reviewed Labs: ordered. Radiology: ordered.  Risk OTC drugs. Prescription drug management. Decision regarding hospitalization.   Mahdi Frye is here with nausea vomiting diarrhea cough shortness of breath fatigue.  Patient arrives febrile tachycardic.  Code sepsis initiated.  Seemingly viral symptoms but will evaluate for pneumonia UTI electrolyte abnormalities.  Will get blood cultures chest x-ray basic labs lactic acid.  Will give IV fluids IV antibiotics and reevaluate.  At this time concern for  sepsis but could be viral process.  Overall per my review interpretation of labs he does have significant AKI with a creatinine of 8.  His pH is normal.  His lactic acid is normal.  Blood sugar was mildly elevated in the 300s.  Bicarb was 17.  I suspect that is from uremia.  His liver enzymes are elevated.  Overall I think he is severely dehydrated in the setting of sepsis.  I ended up getting a CT scan of the chest abdomen and pelvis that showed multifocal pneumonia.  No obstructive process in the kidneys.  Fatty liver changes to the liver.  Overall I have let nephrology team be aware of him but recommend that the hospitalist team reach out to patient's admitted.  At this time we are going to hydrate and give IV antibiotics and further treat for sepsis.  He has not had any urine output at this time.  Ultimately will admit to hospitalist for further care.  This chart was dictated using voice recognition software.  Despite best efforts to proofread,  errors can occur which can change the documentation meaning.      Final diagnoses:  Sepsis, due to unspecified organism, unspecified whether acute organ dysfunction present [facility])  Acute renal failure, unspecified acute renal failure type [facility])  Community acquired pneumonia, unspecified laterality    ED Discharge Orders     None          Ruthe Cornet, DO 01/12/24 1246

## 2024-01-12 NOTE — Progress Notes (Signed)
 eLink Physician-Brief Progress Note Patient Name: Adam Beasley DOB: 1965/11/13 MRN: [id number]   Date of Service  01/12/2024  HPI/Events of Note  Patient now in ICU. On 4 liter o2 with stable vitals and awake and oriented per RN. Plan for HD per renal notes. CT head pending. Rn also notes he was placed on enteric precautions since admission but no more diarrhea and no stool studies etc ever sent so I removed that order.   eICU Interventions  Ground team notified of arrival in ICU. Dw RN. Hold the dose of [place] heparin  till the CT is done.      Intervention Category Major Interventions: Seizures - evaluation and management Evaluation Type: New Patient Evaluation  [name] 01/12/2024, 11:46 PM  4 am - DW Dr Layman . Reviewed CXR. Line is fully in, no PTX on CXR . Placed ok to use order for crrt.

## 2024-01-12 NOTE — Progress Notes (Addendum)
 Patient was seen for seizure.   He is a 58 yr old man with HTN and DM who presented with a few days of N/V and SOB, was found to be febrile, had pneumonia on CT, and labs concerning for acute renal failure.   He was admitted and had a witnessed seizure on the inpatient unit. He does not have any history of seizures. He was given Ativan , does not appear to be seizing now, but is poorly responsive.   Dr. Geralynn of nephrology has evaluated the patient and recommends transfer to ICU for likely CRRT. I spoke with Dr. Layman of PCCM who kindly agreed to evaluate the patient for transfer to the ICU.   Seizure is suspected secondary to metabolic derangements but will check head CT, stop cefepime.

## 2024-01-12 NOTE — Progress Notes (Signed)
 Good afternoon. please Call [facility] at [phone] re: Pt Adam Beasley, Adam Beasley / Room: [facility] MRN [id number].  >> 58 year old past medical history of diabetes mellitus type 2 and hypertension presenting at bedside or [place] acutely ill with nausea vomiting diarrhea x 4 days along with shortness of breath and fatigue.  No alleviating factors.  No reports of headache or neck pain. Labs as below.  Overall shows: Patient meets severe sepsis criteria with endorgan dysfunction with acute liver injury with abnormal AST ALT of 834 and 263 total bili of 2.2.,  INR 1.1, Tylenol  and salicylate level. CMP shows sodium 127 chloride 91 indicative of dehydration, bicarb of 17 anion gap of 18, glucose 328, BUN of 51, acute kidney injury with a creatinine of 8.69 EGFR of 7, AST of 834 ALT 263 total bili 2.2.  Magnesium level ordered and pending hepatic function test added on and pending. Lactic acid 1.6. Initial CBC showing normal white count of 7.5 hemoglobin of 11.7 platelets of 177 normal differentials. Procalcitonin, CRP, ESR, blood cultures, urine cultures, urine sodium, urine creatinine, urinalysis complete ordered and pending. Stool GI PCR, stool occult, stool culture, ordered and pending. Upper respiratory viral panel negative for flu RSV and COVID. Patient had CT chest abdomen and pelvis noncontrast showing: 1. New consolidation in a substantial portion of the left upper lobe and in the superior segment left lower lobe favoring multilobar pneumonia. Associated air bronchograms without mass effect on the associated airways. Small left pleural effusion. 2. Subacute healing left anterior eighth rib fracture, not present/perceptible on 10/25/2023. 3. Diffuse hepatic steatosis. 4. Distal colonic and rectal air-levels favoring diarrheal process. 5. Mild cardiomegaly. 6. Stable localized sclerosis along the iliac side of the left SI joint is probably related to arthropathy although technically nonspecific. No change  from 10/25/2023. 7. Mild lower lumbar spondylosis and degenerative disc disease. Patient chart review shows patient has had a CT angio chest abdomen and pelvis in May 2025 showing no aortic aneurysm, 1.5 cm fat-containing lesion in the proximal transverse colon with recommendations for colonoscopy, also seen as a sclerotic lesion in the left iliac bone likely of little metabolic activity. >> Chart review shows patient's had a renal artery duplex in March 2024 showing no evidence of hemodynamically significant stenosis in either renal artery and normal sonographic appearance of the kidneys.         Latest Ref Rng & Units 01/12/2024   12:00 PM 01/12/2024    9:51 AM 10/25/2023    2:19 PM  CMP  Glucose 70 - 99 mg/dL  671  865   BUN 6 - 20 mg/dL  51  11   Creatinine 9.38 - 1.24 mg/dL  1.30  8.93   Sodium 864 - 145 mmol/L 130  127  137   Potassium 3.5 - 5.1 mmol/L 3.6  3.6  4.0   Chloride 98 - 111 mmol/L  91  101   CO2 22 - 32 mmol/L  17  19   Calcium 8.9 - 10.3 mg/dL  8.6  9.2   Total Protein 6.5 - 8.1 g/dL  7.5  7.8   Total Bilirubin 0.0 - 1.2 mg/dL  2.2  1.1   Alkaline Phos 38 - 126 U/L  78  86   AST 15 - 41 U/L  834  31   ALT 0 - 44 U/L  263  21        Latest Ref Rng & Units 01/12/2024   12:00 PM 01/12/2024   11:19 AM 10/25/2023  2:19 PM  CBC  WBC 4.0 - 10.5 K/uL  7.5  6.1   Hemoglobin 13.0 - 17.0 g/dL 88.7  88.2  85.9   Hematocrit 39.0 - 52.0 % 33.0  32.5  40.6   Platelets 150 - 400 K/uL  177  249     Meds ordered this encounter  Medications   DISCONTD: acetaminophen  (TYLENOL ) tablet 650 mg   sodium chloride  0.9 % bolus 1,000 mL    Reason 30 mL/kg dose is not being ordered:   First Lactic Acid Pending   cefTRIAXone  (ROCEPHIN ) 2 g in sodium chloride  0.9 % 100 mL IVPB    Antibiotic Indication::   CAP   azithromycin  (ZITHROMAX ) 500 mg in sodium chloride  0.9 % 250 mL IVPB    Antibiotic Indication::   CAP   acetaminophen  (TYLENOL ) tablet 1,000 mg   ondansetron  (ZOFRAN ) injection  4 mg   sodium chloride  0.9 % bolus 1,000 mL   0.9 %  sodium chloride  infusion     Orders Placed This Encounter  Procedures   Critical Care   Resp panel by RT-PCR (RSV, Flu A&B, Covid) Anterior Nasal Swab   Blood Culture (routine x 2)   Gastrointestinal Panel by PCR , Stool   C Difficile Quick Screen w PCR reflex   DG Chest Portable 1 View   CT CHEST ABDOMEN PELVIS WO CONTRAST   Comprehensive metabolic panel   CBC with Differential   Protime-INR   Urinalysis, w/ Reflex to Culture (Infection Suspected) -Urine, Clean Catch   Hepatitis panel, acute   Beta-hydroxybutyric acid   Blood gas, venous   Acetaminophen  level   Ethanol   Salicylate level   Pro Brain natriuretic peptide   Gamma GT   Basic metabolic panel   Diet NPO time specified   Document height and weight   Assess and Document Glasgow Coma Scale   Document vital signs within 1-hour of fluid bolus completion. Notify provider of abnormal vital signs despite fluid resuscitation.   DO NOT delay antibiotics if unable to obtain blood culture.   Refer to Sidebar Report: Sepsis Sidebar ED/IP   Notify provider for difficulties obtaining IV access.   Insert peripheral IV x 2   Initiate Carrier Fluid Protocol   Insert foley catheter   Code Sepsis activation.  This occurs automatically when order is signed and prioritizes pharmacy, lab, and radiology services for STAT collections and interventions.  If [facility] downtime, call [facility] [phone]) to activate Code Sepsis.   Consult to hospitalist   Enteric precautions (UV disinfection)   I-Stat venous blood gas, ED   ED EKG   EKG 12-Lead    Intake/Output Summary (Last 24 hours) at 01/12/2024 1303 Last data filed at 01/12/2024 1230 Gross per 24 hour  Intake 1350.17 ml  Output --  Net 1350.17 ml    EKG: Sinus tach. 5W  Patient accepted to progressive unit for nausea vomiting diarrhea, sepsis, acute liver injury, acute kidney injury.

## 2024-01-12 NOTE — ED Triage Notes (Signed)
 Pt report cough, diarrhea, N&V X 4 days. Also Endorses SOB & fatigue.

## 2024-01-12 NOTE — Plan of Care (Signed)

## 2024-01-12 NOTE — Consult Note (Signed)
 Renal Service Consult Note [facility]  Adam Beasley 01/12/2024 Adam JONETTA Fret, MD Requesting Physician: Dr. Tobie  Reason for Consult: Renal failure  HPI: The patient is a 58 y.o. year-old w/ PMH as below who presented early this morning c/o cough, diarrhea and N/V for the last 4 days. Also SOB and fatigue. In the ED temp 103, RR 20, HR 121, BP 160/90. [place] 2 L at 93%.  Labs showed Na 130, K 3.6, CO2 14, bun 51, creat 8.81 (0.9 in may 2025). Ca 8.8, AG 20, alb 3.6, AST 830, ALT 264, total bili 2.1. Pro BNP 528, LA 1.6, wBC 7K, Hb 11.7, plt 177. Venous ABG 7.37/ 27/ 57. CXR showed L sided infiltrates, felt to be body tissue. CT chest showed new consolidation in a substantial portion of the left upper lobe and in the superior segment left lower lobe favoring multilobar pneumonia w/ associated air bronchograms. Pt was given 3 L NS, IV flagyl , IV rocephin , IV cefepime and IV zithromax . Also NS 0.9% at 75 cc/hr, insulin , zofran . Foley was placed w/ minimal brownish yellow urine in collection bag.    Pt seen in room.  There was a rapid response on-going. Pt was having tonic-clonic body movements and was given IV ativan  for suspected seizures. Per the pt's family, we was lucid up to this event. Pt remained post-ictal and was not responding to questions.   The pt's wife states as above about 1 week of cough, diarrhea, some N/V, SOB and fatigue. Hadn't been eating, was drinking good amts of H2O. No hx of kidney problems. Is diabetic and has HTN and HL.    ROS - no hx obtained   Past Medical History  Past Medical History:  Diagnosis Date   Diabetes mellitus without complication (HCC)    Hyperlipidemia    Hypertension    Past Surgical History History reviewed. No pertinent surgical history. Family History History reviewed. No pertinent family history. Social History  reports that he has never smoked. He has never used smokeless tobacco. He reports that he does not drink alcohol.  No history on file for drug use. Allergies No Known Allergies Home medications Prior to Admission medications   Medication Sig Start Date End Date Taking? Authorizing Provider  amLODipine  (NORVASC ) 5 MG tablet Take 1 tablet (5 mg total) by mouth daily. 08/29/23  Yes Thapa, [place], MD  atorvastatin (LIPITOR) 80 MG tablet Take 80 mg by mouth daily.   Yes [provider]  doxazosin  (CARDURA ) 2 MG tablet Take 2 mg by mouth daily.   Yes [provider]  eplerenone  (INSPRA ) 50 MG tablet Take 1 tablet (50 mg total) by mouth daily. 08/28/23  Yes Thapa, [place], MD  metFORMIN (GLUCOPHAGE) 500 MG tablet Take 500 mg by mouth daily with breakfast.   Yes [provider]  azithromycin  (ZITHROMAX ) 250 MG tablet Take 1 tablet (250 mg total) by mouth daily. 07/15/14   Terryl Kubas, NP  chlorpheniramine-HYDROcodone [facility] PENNKINETIC ER) 10-8 MG/5ML LQCR Take 5 mLs by mouth 2 (two) times daily. 07/15/14   Terryl Kubas, NP  doxazosin  (CARDURA ) 1 MG tablet Take 2 tablets (2 mg total) by mouth daily. Patient not taking: Reported on 01/12/2024 08/28/23   Thapa, [place], MD     Vitals:   01/12/24 2000 01/12/24 2030 01/12/24 2045 01/12/24 2100  BP: (!) 133/93  130/65 130/67  Pulse: (!) 105 (!) 155 (!) 128 (!) 128  Resp: 20     Temp: (!) 100.7 F (38.2  C)     TempSrc: Oral     SpO2: 94% (!) 78% 96% 97%  Weight:      Height:       Exam Gen obtunded, on FM O2, post-ictal, rapid heavy breaths No rash, cyanosis or gangrene Sclera anicteric, throat clear  No jvd or bruits Chest clear bilat to bases RRR no MRG Abd soft ntnd no mass or ascites +bs GU foley cath in place MS no joint effusions or deformity Ext no LE or UE edema, no other edema Neuro as above      Home meds: Norvasc  5 every day Lipitor 80 every day Cardura  2mg  every day (not taking) Eplerenone  50mg  daily Metformin 500 mg w/ breakfast  01/12/24 - cloudy, mod bili, amber, Hb large, prot > 300, many bacteria, 0-5 rbc/  0-5 epi/ 0-5 wbcs.     Assessment/ Plan: AKI, severe: w/ normal b/l creat 0.9- 1.0 from jan and may 2025, eGFR 96 ml/min. Creat here is 8.6 in the setting of an acute resp illness x 1 week, w/ multifocal PNA most likely per CT. No acei/ ARB at home, BP's stable here. No nephrotoxins. On exam looks euvolemic, possibly a bit dry. CT showed bilat kidneys w/o obstruction. UA showed ^protein but no rbcs/ wbcs. Patient just had a significant seizure episode, this could possibly due to uremia. Not sure cause of AKI, vol depletion probably playing a role. Has brown urine in small amts, could have ATN. Also r/o rhabdo, CPK is pending. Due to the seizures, which could be uremia-related, pt will need RRT. Recommend CRRT in ICU.  Multifocal PNA: per CT, on IV abx per pmd Acute seizures: tonight, RR rx'd w/ IV ativan .    Myer Fret  MD [facility] 01/12/2024, 9:21 PM  Recent Labs  Lab 01/12/24 0951 01/12/24 1119 01/12/24 1119 01/12/24 1200 01/12/24 1341 01/12/24 1441  HGB  --  11.7*   < > 11.2*  --  10.2*  ALBUMIN 3.6 3.6  --   --   --   --   CALCIUM 8.6*  --   --   --  7.9*  --   CREATININE 8.69*  --   --   --  8.81*  --   K 3.6  --   --  3.6 3.7 3.8   < > = values in this interval not displayed.   Inpatient medications:  heparin   5,000 Units Subcutaneous Q12H   insulin  aspart  0-6 Units Subcutaneous Q4H   LORazepam   2 mg Intravenous Once    sodium chloride  75 mL/hr at 01/12/24 1950   [START ON 01/13/2024] azithromycin      [START ON 01/13/2024] cefTRIAXone  (ROCEPHIN )  IV     famotidine  (PEPCID ) IV Stopped (01/12/24 1453)   hydrALAZINE , morphine  injection

## 2024-01-12 NOTE — H&P (Addendum)
 History and Physical    Patient: Adam Beasley FMW:[id number] DOB: Jul 15, 1965 DOA: 01/12/2024 DOS: the patient was seen and examined on 01/12/2024 . PCP: [name], MD  Patient coming from: Home Chief complaint: Chief Complaint  Patient presents with   Cough   HPI:  Adam Beasley is a 58 y.o. male with past medical history  of essential hypertension, diabetes mellitus type 2 presenting withacutely ill with nausea vomiting diarrhea x 4 days along with shortness of breath and fatigue. No alleviating factors. No reports of headache or neck pain.  No reports of blood in the stool or urine.  No reports of chest pain.   ED Course:  Vital signs in the ED were notable for the following:  Vitals:   01/12/24 1221 01/12/24 1230 01/12/24 1257 01/12/24 1621  BP:  118/84 113/76 132/88  Pulse:  97 90 100  Temp: 98.9 F (37.2 C)  99 F (37.2 C) 100 F (37.8 C)  Resp:   (!) 30   Height:    6' (1.829 m)  Weight:    110.1 kg  SpO2:  95% 96% 94%  TempSrc: Oral  Oral Oral  BMI (Calculated):    32.91   >>ED evaluation thus far shows: Labs as below.  Overall shows: Patient meets severe sepsis criteria with endorgan dysfunction with acute liver injury with abnormal AST ALT of 834 and 263 total bili of 2.2.,  INR 1.1, Tylenol  and salicylate level. CMP shows sodium 127 chloride 91 indicative of dehydration, bicarb of 17 anion gap of 18, glucose 328, BUN of 51, acute kidney injury with a creatinine of 8.69 EGFR of 7, AST of 834 ALT 263 total bili 2.2.  Magnesium level ordered and pending hepatic function test added on and pending. Lactic acid 1.6. Initial CBC showing normal white count of 7.5 hemoglobin of 11.7 platelets of 177 normal differentials. Procalcitonin, CRP, ESR, blood cultures, urine cultures, urine sodium, urine creatinine, urinalysis complete ordered and pending. Stool GI PCR, stool occult, stool culture, ordered and pending. Upper respiratory viral panel negative for flu RSV  and COVID. Patient had CT chest abdomen and pelvis noncontrast showing: 1. New consolidation in a substantial portion of the left upper lobe and in the superior segment left lower lobe favoring multilobar pneumonia. Associated air bronchograms without mass effect on the associated airways. Small left pleural effusion. 2. Subacute healing left anterior eighth rib fracture, not present/perceptible on 10/25/2023. 3. Diffuse hepatic steatosis. 4. Distal colonic and rectal air-levels favoring diarrheal process. 5. Mild cardiomegaly. 6. Stable localized sclerosis along the iliac side of the left SI joint is probably related to arthropathy although technically nonspecific. No change from 10/25/2023. 7. Mild lower lumbar spondylosis and degenerative disc disease. Patient chart review shows patient has had a CT angio chest abdomen and pelvis in May 2025 showing no aortic aneurysm, 1.5 cm fat-containing lesion in the proximal transverse colon with recommendations for colonoscopy, also seen as a sclerotic lesion in the left iliac bone likely of little metabolic activity. >> Chart review shows patient's had a renal artery duplex in March 2024 showing no evidence of hemodynamically significant stenosis in either renal artery and normal sonographic appearance of the kidneys.  >>While in the ED patient received the following:   Medications   DISCONTD: acetaminophen  (TYLENOL ) tablet 650 mg   sodium chloride  0.9 % bolus 1,000 mL      Reason 30 mL/kg dose is not being ordered:   First Lactic Acid Pending   cefTRIAXone  (ROCEPHIN )  2 g in sodium chloride  0.9 % 100 mL IVPB      Antibiotic Indication::   CAP   azithromycin  (ZITHROMAX ) 500 mg in sodium chloride  0.9 % 250 mL IVPB      Antibiotic Indication::   CAP   acetaminophen  (TYLENOL ) tablet 1,000 mg   ondansetron  (ZOFRAN ) injection 4 mg   sodium chloride  0.9 % bolus 1,000 mL   0.9 %  sodium chloride  infusion     Review of Systems  Constitutional:   Positive for chills, fever and malaise/fatigue.  Gastrointestinal:  Positive for abdominal pain, diarrhea, nausea and vomiting.  All other systems reviewed and are negative.  Past Medical History:  Diagnosis Date   Diabetes mellitus without complication (HCC)    Hyperlipidemia    Hypertension    History reviewed. No pertinent surgical history.  reports that he has never smoked. He has never used smokeless tobacco. He reports that he does not drink alcohol. No history on file for drug use. No Known Allergies History reviewed. No pertinent family history. Prior to Admission medications   Medication Sig Start Date End Date Taking? Authorizing Provider  amLODipine  (NORVASC ) 5 MG tablet Take 1 tablet (5 mg total) by mouth daily. 08/29/23  Yes Thapa, [place], MD  atorvastatin (LIPITOR) 80 MG tablet Take 80 mg by mouth daily.   Yes [provider]  doxazosin  (CARDURA ) 2 MG tablet Take 2 mg by mouth daily.   Yes [provider]  eplerenone  (INSPRA ) 50 MG tablet Take 1 tablet (50 mg total) by mouth daily. 08/28/23  Yes Thapa, [place], MD  metFORMIN (GLUCOPHAGE) 500 MG tablet Take 500 mg by mouth daily with breakfast.   Yes [provider]  azithromycin  (ZITHROMAX ) 250 MG tablet Take 1 tablet (250 mg total) by mouth daily. 07/15/14   Terryl Kubas, NP  chlorpheniramine-HYDROcodone [facility] PENNKINETIC ER) 10-8 MG/5ML LQCR Take 5 mLs by mouth 2 (two) times daily. 07/15/14   Terryl Kubas, NP  doxazosin  (CARDURA ) 1 MG tablet Take 2 tablets (2 mg total) by mouth daily. Patient not taking: Reported on 01/12/2024 08/28/23   Thapa, [place], MD                                                                                 Vitals:   01/12/24 1221 01/12/24 1230 01/12/24 1257 01/12/24 1621  BP:  118/84 113/76 132/88  Pulse:  97 90 100  Resp:   (!) 30   Temp: 98.9 F (37.2 C)  99 F (37.2 C) 100 F (37.8 C)  TempSrc: Oral  Oral Oral  SpO2:  95% 96% 94%  Weight:    110.1 kg  Height:     6' (1.829 m)   Physical Exam Vitals and nursing note reviewed.  Constitutional:      General: He is not in acute distress. HENT:     Head: Normocephalic and atraumatic.     Right Ear: Hearing normal.     Left Ear: Hearing normal.     Nose: No nasal deformity.     Mouth/Throat:     Lips: Pink.     Mouth: Mucous membranes are moist.  Eyes:     General: Lids  are normal.     Extraocular Movements: Extraocular movements intact.  Cardiovascular:     Rate and Rhythm: Regular rhythm. Tachycardia present.     Heart sounds: Normal heart sounds.  Pulmonary:     Effort: Pulmonary effort is normal. Tachypnea present. No respiratory distress.     Breath sounds: Examination of the right-middle field reveals rales. Examination of the left-middle field reveals rales. Examination of the right-lower field reveals rales. Examination of the left-lower field reveals rales. Rales present.     Comments: O2 sats 91% on RA. Going down from 94. Abdominal:     General: Bowel sounds are normal. There is no distension.     Palpations: Abdomen is soft. There is no mass.     Tenderness: There is no abdominal tenderness.  Musculoskeletal:     Right lower leg: No edema.     Left lower leg: No edema.  Skin:    General: Skin is warm.  Neurological:     General: No focal deficit present.     Mental Status: He is alert and oriented to person, place, and time.     Cranial Nerves: Cranial nerves 2-12 are intact.  Psychiatric:        Mood and Affect: Mood normal.        Speech: Speech normal.        Behavior: Behavior normal.     Labs on Admission: I have personally reviewed following labs and imaging studies CBC: Recent Labs  Lab 01/12/24 1119 01/12/24 1200 01/12/24 1441  WBC 7.5  --   --   NEUTROABS 5.8  --   --   HGB 11.7* 11.2* 10.2*  HCT 32.5* 33.0* 30.0*  MCV 81.7  --   --   PLT 177  --   --    Basic Metabolic Panel: Recent Labs  Lab 01/12/24 0951 01/12/24 1200 01/12/24 1341  01/12/24 1441  NA 127* 130* 129* 131*  K 3.6 3.6 3.7 3.8  CL 91*  --  95*  --   CO2 17*  --  14*  --   GLUCOSE 328*  --  305*  --   BUN 51*  --  51*  --   CREATININE 8.69*  --  8.81*  --   CALCIUM 8.6*  --  7.9*  --   MG  --   --  2.0  --    GFR: Estimated Creatinine Clearance: 11.9 mL/min (A) (by C-G formula based on SCr of 8.81 mg/dL (H)). Liver Function Tests: Recent Labs  Lab 01/12/24 0951 01/12/24 1119  AST 834* 830*  ALT 263* 264*  ALKPHOS 78 77  BILITOT 2.2* 2.1*  PROT 7.5 7.5  ALBUMIN 3.6 3.6   No results for input(s): LIPASE, AMYLASE in the last 168 hours. No results for input(s): AMMONIA in the last 168 hours. Coagulation Profile: Recent Labs  Lab 01/12/24 1119  INR 1.1   Cardiac Enzymes: No results for input(s): CKTOTAL, CKMB, CKMBINDEX, TROPONINI in the last 168 hours. BNP (last 3 results) Recent Labs    01/12/24 1341  PROBNP 528.0*   HbA1C: No results for input(s): HGBA1C in the last 72 hours. CBG: No results for input(s): GLUCAP in the last 168 hours. Lipid Profile: No results for input(s): CHOL, HDL, LDLCALC, TRIG, CHOLHDL, LDLDIRECT in the last 72 hours. Thyroid Function Tests: No results for input(s): TSH, T4TOTAL, FREET4, T3FREE, THYROIDAB in the last 72 hours. Anemia Panel: No results for input(s): VITAMINB12, FOLATE, FERRITIN, TIBC, IRON, RETICCTPCT  in the last 72 hours. Urine analysis:    Component Value Date/Time   COLORURINE AMBER (A) 01/12/2024 1338   APPEARANCEUR CLOUDY (A) 01/12/2024 1338   LABSPEC >=1.030 01/12/2024 1338   PHURINE 5.0 01/12/2024 1338   GLUCOSEU 250 (A) 01/12/2024 1338   HGBUR LARGE (A) 01/12/2024 1338   BILIRUBINUR MODERATE (A) 01/12/2024 1338   KETONESUR NEGATIVE 01/12/2024 1338   PROTEINUR >=300 (A) 01/12/2024 1338   NITRITE NEGATIVE 01/12/2024 1338   LEUKOCYTESUR NEGATIVE 01/12/2024 1338   Radiological Exams on Admission: CT CHEST ABDOMEN PELVIS WO  CONTRAST Result Date: 01/12/2024 CLINICAL DATA:  Sepsis EXAM: CT CHEST, ABDOMEN AND PELVIS WITHOUT CONTRAST TECHNIQUE: Multidetector CT imaging of the chest, abdomen and pelvis was performed following the standard protocol without IV contrast. RADIATION DOSE REDUCTION: This exam was performed according to the departmental dose-optimization program which includes automated exposure control, adjustment of the mA and/or kV according to patient size and/or use of iterative reconstruction technique. COMPARISON:  10/25/2023 FINDINGS: CT CHEST FINDINGS Cardiovascular: Mild cardiomegaly. Minimal atheromatous vascular calcification of the thoracic aorta. Mediastinum/Nodes: Likely reactive prevascular node 1.0 cm in short axis on image 20 series 301. Lungs/Pleura: New consolidation in a substantial portion of the left upper lobe and in the superior segment left lower lobe favoring multilobar pneumonia. Associated air bronchograms without mass effect on the associated airways. Small left pleural effusion. Musculoskeletal: Subacute healing left anterior eighth rib fracture on image 99 series 302, not present/perceptible on 10/25/2023. Thoracic spondylosis. Mild bilateral gynecomastia. CT ABDOMEN PELVIS FINDINGS Hepatobiliary: Diffuse hepatic steatosis. No focal hepatic lesion identified on today's noncontrast exam. Pancreas: Unremarkable Spleen: Unremarkable Adrenals/Urinary Tract: Unremarkable Stomach/Bowel: Distal colonic and rectal air-levels favoring diarrheal process. No dilated bowel. Appendix poorly seen today. Vascular/Lymphatic: Unremarkable Reproductive: Unremarkable Other: No supplemental non-categorized findings. Musculoskeletal: Stable localized sclerosis along the iliac side of the left SI joint is probably related to arthropathy although technically nonspecific. No change from 10/25/2023. Mild lower lumbar spondylosis and degenerative disc disease. IMPRESSION: 1. New consolidation in a substantial portion of the left  upper lobe and in the superior segment left lower lobe favoring multilobar pneumonia. Associated air bronchograms without mass effect on the associated airways. Small left pleural effusion. 2. Subacute healing left anterior eighth rib fracture, not present/perceptible on 10/25/2023. 3. Diffuse hepatic steatosis. 4. Distal colonic and rectal air-levels favoring diarrheal process. 5. Mild cardiomegaly. 6. Stable localized sclerosis along the iliac side of the left SI joint is probably related to arthropathy although technically nonspecific. No change from 10/25/2023. 7. Mild lower lumbar spondylosis and degenerative disc disease. Electronically Signed   By: Ryan Salvage M.D.   On: 01/12/2024 12:16   DG Chest Portable 1 View Result Date: 01/12/2024 CLINICAL DATA:  Cough. EXAM: PORTABLE CHEST 1 VIEW COMPARISON:  10/25/2023 FINDINGS: The heart size and mediastinal contours are within normal limits. Patient is partially rotated to the left. Asymmetric opacity in the left hemithorax is probably due to overlying soft tissue attenuation, with left lung infiltrate considered less likely. No pleural effusion. IMPRESSION: Asymmetric opacity in left hemithorax, probably due to overlying soft tissue attenuation, with left lung opacity considered less likely. Recommend routine PA and lateral chest radiographs for further evaluation. Electronically Signed   By: Norleen DELENA Kil M.D.   On: 01/12/2024 10:51   Data Reviewed: Relevant notes from primary care and specialist visits, past discharge summaries as available in EHR, including Care Everywhere . Prior diagnostic testing as pertinent to current admission diagnoses, Updated medications and problem lists  for reconciliation .ED course, including vitals, labs, imaging, treatment and response to treatment,Triage notes, nursing and pharmacy notes and ED provider's notes.Notable results as noted in HPI.Discussed case with EDMD/ ED APP/ or Specialty MD on call and as  needed.  Assessment & Plan  >>Sepsis: Patient presenting with fevers chills suspect GI illness cultures have been collected and will follow.  Source here is most likely been pneumonia and colitis, will start patient on cefepime and Flagyl .  >>N/V/D/ Abnormal LFT: Supportive care with IV fluids antiemetics IV PPI clear liquid diet as tolerated. Abnormal LFTs with acute liver injury suspect from hypotension and ischemic or shock liver versus infectious from diarrheal illness and resultant liver injury.  GI has been consulted.  CT imaging shows essentially normal exam.   >> Acute liver injury: Acute hepatitis panel collected, GGT collected and pending, liver ultrasound per GI.  ANA ordered.  Tylenol  level is less than 10 salicylate level less than 7 alcohol level less than 15.    Latest Ref Rng & Units 01/12/2024   11:19 AM 01/12/2024    9:51 AM 10/25/2023    2:19 PM  Hepatic Function  Total Protein 6.5 - 8.1 g/dL 7.5  7.5  7.8   Albumin 3.5 - 5.0 g/dL 3.6  3.6  4.5   AST 15 - 41 U/L 830  834  31   ALT 0 - 44 U/L 264  263  21   Alk Phosphatase 38 - 126 U/L 77  78  86   Total Bilirubin 0.0 - 1.2 mg/dL 2.1  2.2  1.1   Bilirubin, Direct 0.0 - 0.2 mg/dL 1.4        >>AKI: Lab Results  Component Value Date   CREATININE 8.81 (H) 01/12/2024   CREATININE 8.69 (H) 01/12/2024   CREATININE 1.06 10/25/2023  Acute kidney injury include differential include contributing to the diarrhea and resultant hypovolemia prerenal acute kidney injury.  Differentials also include bacterial illness resulting in acute kidney injury with E. coli 01 57, stool cultures and PCR has been sent.  Supportive care IV fluid hydration nephrology consult has been requested. Avoid contrast studies.  CPK is pending.  >> Cardiomegaly: Chest x-ray shows cardiomegaly and CT chest shows pneumonia along with a small left pleural effusion.  Will obtain a 2D echocardiogram to evaluate ejection fraction as patient also has an elevated  BNP.   >> Diabetes mellitus type 2: Glycemic protocol and clear liquid diet as tolerated. IV PPI.   >> Essential hypertension: Due to soft and fluctuating bp,  blood pressures Home regimen of blood pressure medication to be held today.    DVT prophylaxis:  SCDs Consults:  Nephrology GI  Advance Care Planning:    Code Status: Full Code   Family Communication:  Wife Disposition Plan:  Home Severity of Illness: The appropriate patient status for this patient is INPATIENT. Inpatient status is judged to be reasonable and necessary in order to provide the required intensity of service to ensure the patient's safety. The patient's presenting symptoms, physical exam findings, and initial radiographic and laboratory data in the context of their chronic comorbidities is felt to place them at high risk for further clinical deterioration. Furthermore, it is not anticipated that the patient will be medically stable for discharge from the hospital within 2 midnights of admission.   * I certify that at the point of admission it is my clinical judgment that the patient will require inpatient hospital care spanning beyond 2 midnights from the point  of admission due to high intensity of service, high risk for further deterioration and high frequency of surveillance required.*  Unresulted Labs (From admission, onward)     Start     Ordered   01/13/24 0500  Protime-INR  Tomorrow morning,   R        01/12/24 1700   01/13/24 0500  Cortisol-am, blood  Tomorrow morning,   R        01/12/24 1700   01/13/24 0500  Comprehensive metabolic panel  Tomorrow morning,   R        01/12/24 1700   01/13/24 0500  CBC  Tomorrow morning,   R        01/12/24 1700   01/12/24 1659  Urine Culture (for pregnant, neutropenic or urologic patients or patients with an indwelling urinary catheter)  (Urine Culture)  Add-on,   AD       Question:  Indication  Answer:  Sepsis   01/12/24 1700   01/12/24 1654  HIV Antibody  (routine testing w rflx)  (HIV Antibody (Routine testing w reflex) panel)  Once,   R        01/12/24 1700   01/12/24 1652  Lipase, blood  Add-on,   AD        01/12/24 1651   01/12/24 1650  Procalcitonin  Add-on,   AD       References:    Procalcitonin Lower Respiratory Tract Infection AND Sepsis Procalcitonin Algorithm   01/12/24 1649   01/12/24 1650  Hemoglobin A1c  Add-on,   AD       Comments: To assess prior glycemic control    01/12/24 1650   01/12/24 1637  Lactate dehydrogenase  Once,   R        01/12/24 1640   01/12/24 1634  CK  Once,   R        01/12/24 1640   01/12/24 1633  ANA w/Reflex if Positive  Once,   R        01/12/24 1640   01/12/24 1311  Sodium, urine, random  Once,   URGENT        01/12/24 1310   01/12/24 1311  Creatinine, urine, random  Once,   URGENT        01/12/24 1310   01/12/24 1303  Gamma GT  Once,   URGENT        01/12/24 1302   01/12/24 0951  Blood Culture (routine x 2)  (Septic presentation on arrival (screening labs, nursing and treatment orders for obvious sepsis))  BLOOD CULTURE X 2,   STAT      01/12/24 9048   Unscheduled  Occult blood card to lab, stool  As needed,   R      01/12/24 1308            Meds ordered this encounter  Medications   DISCONTD: acetaminophen  (TYLENOL ) tablet 650 mg   sodium chloride  0.9 % bolus 1,000 mL    Reason 30 mL/kg dose is not being ordered:   First Lactic Acid Pending   cefTRIAXone  (ROCEPHIN ) 2 g in sodium chloride  0.9 % 100 mL IVPB    Antibiotic Indication::   CAP   azithromycin  (ZITHROMAX ) 500 mg in sodium chloride  0.9 % 250 mL IVPB    Antibiotic Indication::   CAP   acetaminophen  (TYLENOL ) tablet 1,000 mg   ondansetron  (ZOFRAN ) injection 4 mg   sodium chloride  0.9 % bolus 1,000 mL   0.9 %  sodium  chloride infusion   famotidine  (PEPCID ) IVPB 20 mg premix   sodium chloride  0.9 % bolus 1,000 mL   insulin  aspart (novoLOG ) injection 0-6 Units    Correction coverage::   Very Sensitive (ESRD/Dialysis)     CBG < 70::   Implement Hypoglycemia Standing Orders and refer to Hypoglycemia Standing Orders sidebar report    CBG 70 - 120::   0 units    CBG 121 - 150::   0 units    CBG 151 - 200::   1 unit    CBG 201-250::   2 units    CBG 251-300::   3 units    CBG 301-350::   4 units    CBG 351-400::   5 units    CBG > 400:   Give 6 units and call MD   lactated ringers  infusion   morphine  (PF) 2 MG/ML injection 2 mg   heparin  injection 5,000 Units   hydrALAZINE  (APRESOLINE ) injection 10 mg   metroNIDAZOLE  (FLAGYL ) IVPB 500 mg    Antibiotic Indication::   Intra-abdominal Infection     Orders Placed This Encounter  Procedures   Critical Care   Resp panel by RT-PCR (RSV, Flu A&B, Covid) Anterior Nasal Swab   Blood Culture (routine x 2)   Urine Culture (for pregnant, neutropenic or urologic patients or patients with an indwelling urinary catheter)   DG Chest Portable 1 View   CT CHEST ABDOMEN PELVIS WO CONTRAST   Comprehensive metabolic panel   CBC with Differential   Protime-INR   Urinalysis, w/ Reflex to Culture (Infection Suspected) -Urine, Clean Catch   Hepatitis panel, acute   Beta-hydroxybutyric acid   Acetaminophen  level   Ethanol   Salicylate level   Pro Brain natriuretic peptide   Gamma GT   Basic metabolic panel   Sedimentation rate   C-reactive protein   Magnesium   Occult blood card to lab, stool   Sodium, urine, random   Creatinine, urine, random   Hepatic function panel   Lactic acid, plasma   ANA w/Reflex if Positive   CK   Lactate dehydrogenase   Procalcitonin   Hemoglobin A1c   Lipase, blood   HIV Antibody (routine testing w rflx)   Protime-INR   Cortisol-am, blood   Comprehensive metabolic panel   CBC   Diet clear liquid Room service appropriate? Yes; Fluid consistency: Thin   Document height and weight   Assess and Document Glasgow Coma Scale   Document vital signs within 1-hour of fluid bolus completion. Notify provider of abnormal vital signs despite  fluid resuscitation.   DO NOT delay antibiotics if unable to obtain blood culture.   Refer to Sidebar Report: Sepsis Sidebar ED/IP   Notify provider for difficulties obtaining IV access.   Insert peripheral IV x 2   Initiate Carrier Fluid Protocol   Insert foley catheter   Cardiac Monitoring - Continuous Indefinite   Apply Diabetes Mellitus Care Plan   STAT CBG when hypoglycemia is suspected. If treated, recheck every 15 minutes after each treatment until CBG >/= 70 mg/dl   Refer to Hypoglycemia Protocol Sidebar Report for treatment of CBG < 70 mg/dl   No HS correction Insulin    Notify physician (specify)   Mobility Protocol: No Restrictions   If lactate (lactic acid) >2, verify repeat lactic acid order has been placed to be drawn   Initiate Adult [employer] and Catheter Protocol for patients with central line (CVC, PICC, Port, Hemodialysis, Trialysis)   Apply  Sepsis Care Plan   Initiate Oral Care Protocol   RN may order General Admission PRN Orders utilizing General Admission PRN medications (through manage orders) for the following patient needs: allergy symptoms (Claritin), cold sores (Carmex), cough (Robitussin DM), eye irritation (Liquifilm Tears), hemorrhoids (Tucks), indigestion (Maalox), minor skin irritation (Hydrocortisone Cream), muscle pain Lucienne Gay), nose irritation (saline nasal spray) and sore throat (Chloraseptic spray).   Notify physician (specify)   Mobility Protocol: No Restrictions RN to initiate protocols based on patient's level of care   Initiate Adult Central Line Maintenance and Catheter Protocol for patients with central line (CVC, PICC, Port, Hemodialysis, Trialysis)   Daily weights   Intake and Output   Initiate CHG Protocol   Initiate Oral Care Protocol   RN may order General Admission PRN Orders utilizing General Admission PRN medications (through manage orders) for the following patient needs: allergy symptoms (Claritin), cold sores (Carmex),  cough (Robitussin DM), eye irritation (Liquifilm Tears), hemorrhoids (Tucks), indigestion (Maalox), minor skin irritation (Hydrocortisone Cream), muscle pain Lucienne Gay), nose irritation (saline nasal spray) and sore throat (Chloraseptic spray).   SCDs   Patient has an active order for admit to inpatient/place in observation   Patient has an active order for admit to inpatient/place in observation   Reason for not ordering HIV testing   Full code   Code Sepsis activation.  This occurs automatically when order is signed and prioritizes pharmacy, lab, and radiology services for STAT collections and interventions.  If [facility] downtime, call [facility] [phone]) to activate Code Sepsis.   Consult to hospitalist   CeFEPIme (MAXIPIME) per pharmacy consult            Enteric precautions (UV disinfection)   Pulse oximetry (single)   Oxygen therapy Mode or (Route): Nasal cannula; Liters Per Minute: 2; Keep O2 saturation between: greater than 92 %   I-Stat venous blood gas, ED   I-Stat arterial blood gas, ED ([facility], [facility], [facility])   ED EKG   EKG 12-Lead   EKG   EKG   ECHOCARDIOGRAM COMPLETE   Admit to Inpatient (patient's expected length of stay will be greater than 2 midnights or inpatient only procedure)   Aspiration precautions   Fall precautions    Author: Mario LULLA Blanch, MD 12 pm- 8 pm. [facility]. 01/12/2024 5:24 PM Please note for any communication after hours contact [facility] Assigned provider on call on Amion.

## 2024-01-12 NOTE — ED Notes (Signed)
 Patient bladder scanned, showing 0ml.

## 2024-01-12 NOTE — Significant Event (Addendum)
 Rapid Response Event Note   Reason for Call : Unresponsiveness Initial Focused Assessment:  I was notified regarding pt unresponsive following approximately 5 min episode of generalized seizure like activity with full body shaking involving all extremities. Upon arrival, pt is nonverbal, localizing to stimuli, moving all extremities. Dr. Geralynn at bedside. Ativan  2mg  IV ordered and given. Pt had approximately 50-100cc of blood tinged emesis during episode. BBS Coarse, diminished. Pt now on NRB mask at 15L with sats 95%. Pt is beginning to verbalize incoherently.  2100-T 100.78F, HR 128 ST, 130/67(84), RR 30 with sats 97% on NRB mask at 15L.     Interventions:  -Ativan  2mg  IV  -NRB mask     MD Notified: Dr. Charlton came to bedside. PCCM consulted. Call Time: 2028 Arrival Time: 2032 End Time:  Griselda Alm ORN, RN

## 2024-01-12 NOTE — Sepsis Progress Note (Signed)
 Sepsis protocol is being followed by e[facility].

## 2024-01-13 ENCOUNTER — Inpatient Hospital Stay (HOSPITAL_COMMUNITY)

## 2024-01-13 ENCOUNTER — Encounter: Payer: Self-pay | Admitting: Pulmonary Disease

## 2024-01-13 DIAGNOSIS — E119 Type 2 diabetes mellitus without complications: Secondary | ICD-10-CM | POA: Diagnosis not present

## 2024-01-13 DIAGNOSIS — K7200 Acute and subacute hepatic failure without coma: Secondary | ICD-10-CM | POA: Diagnosis not present

## 2024-01-13 DIAGNOSIS — R748 Abnormal levels of other serum enzymes: Secondary | ICD-10-CM | POA: Diagnosis not present

## 2024-01-13 DIAGNOSIS — I5021 Acute systolic (congestive) heart failure: Secondary | ICD-10-CM

## 2024-01-13 DIAGNOSIS — J90 Pleural effusion, not elsewhere classified: Secondary | ICD-10-CM | POA: Diagnosis not present

## 2024-01-13 DIAGNOSIS — N179 Acute kidney failure, unspecified: Secondary | ICD-10-CM

## 2024-01-13 DIAGNOSIS — M6282 Rhabdomyolysis: Secondary | ICD-10-CM | POA: Diagnosis not present

## 2024-01-13 DIAGNOSIS — R569 Unspecified convulsions: Secondary | ICD-10-CM | POA: Diagnosis not present

## 2024-01-13 DIAGNOSIS — A419 Sepsis, unspecified organism: Secondary | ICD-10-CM

## 2024-01-13 LAB — CBC
HCT: 29.4 % — ABNORMAL LOW (ref 39.0–52.0)
Hemoglobin: 10.7 g/dL — ABNORMAL LOW (ref 13.0–17.0)
MCH: 29.9 pg (ref 26.0–34.0)
MCHC: 36.4 g/dL — ABNORMAL HIGH (ref 30.0–36.0)
MCV: 82.1 fL (ref 80.0–100.0)
Platelets: 179 K/uL (ref 150–400)
RBC: 3.58 MIL/uL — ABNORMAL LOW (ref 4.22–5.81)
RDW: 13.7 % (ref 11.5–15.5)
WBC: 6.3 K/uL (ref 4.0–10.5)
nRBC: 0 % (ref 0.0–0.2)

## 2024-01-13 LAB — AMMONIA: Ammonia: 46 umol/L — ABNORMAL HIGH (ref 9–35)

## 2024-01-13 LAB — HEPATITIS PANEL, ACUTE
HCV Ab: NONREACTIVE
Hep A IgM: NONREACTIVE
Hep B C IgM: NONREACTIVE
Hepatitis B Surface Ag: NONREACTIVE

## 2024-01-13 LAB — CORTISOL-AM, BLOOD: Cortisol - AM: 22.9 ug/dL — ABNORMAL HIGH (ref 6.7–22.6)

## 2024-01-13 LAB — GLUCOSE, CAPILLARY
Glucose-Capillary: 253 mg/dL — ABNORMAL HIGH (ref 70–99)
Glucose-Capillary: 180 mg/dL — ABNORMAL HIGH (ref 70–99)
Glucose-Capillary: 185 mg/dL — ABNORMAL HIGH (ref 70–99)
Glucose-Capillary: 162 mg/dL — ABNORMAL HIGH (ref 70–99)
Glucose-Capillary: 135 mg/dL — ABNORMAL HIGH (ref 70–99)
Glucose-Capillary: 131 mg/dL — ABNORMAL HIGH (ref 70–99)

## 2024-01-13 LAB — PROTIME-INR
INR: 1.2 (ref 0.8–1.2)
Prothrombin Time: 15.8 s — ABNORMAL HIGH (ref 11.4–15.2)

## 2024-01-13 LAB — RENAL FUNCTION PANEL
Albumin: 2.3 g/dL — ABNORMAL LOW (ref 3.5–5.0)
Albumin: 2.5 g/dL — ABNORMAL LOW (ref 3.5–5.0)
Anion gap: 16 — ABNORMAL HIGH (ref 5–15)
Anion gap: 12 (ref 5–15)
BUN: 63 mg/dL — ABNORMAL HIGH (ref 6–20)
BUN: 45 mg/dL — ABNORMAL HIGH (ref 6–20)
CO2: 17 mmol/L — ABNORMAL LOW (ref 22–32)
CO2: 22 mmol/L (ref 22–32)
Calcium: 7.1 mg/dL — ABNORMAL LOW (ref 8.9–10.3)
Calcium: 7.4 mg/dL — ABNORMAL LOW (ref 8.9–10.3)
Chloride: 100 mmol/L (ref 98–111)
Chloride: 101 mmol/L (ref 98–111)
Creatinine, Ser: 10.17 mg/dL — ABNORMAL HIGH (ref 0.61–1.24)
Creatinine, Ser: 6.98 mg/dL — ABNORMAL HIGH (ref 0.61–1.24)
GFR, Estimated: 5 mL/min — ABNORMAL LOW (ref >60)
GFR, Estimated: 8 mL/min — ABNORMAL LOW (ref >60)
Glucose, Bld: 257 mg/dL — ABNORMAL HIGH (ref 70–99)
Glucose, Bld: 155 mg/dL — ABNORMAL HIGH (ref 70–99)
Phosphorus: 5.6 mg/dL — ABNORMAL HIGH (ref 2.5–4.6)
Phosphorus: 2.9 mg/dL (ref 2.5–4.6)
Potassium: 3.4 mmol/L — ABNORMAL LOW (ref 3.5–5.1)
Potassium: 3.2 mmol/L — ABNORMAL LOW (ref 3.5–5.1)
Sodium: 133 mmol/L — ABNORMAL LOW (ref 135–145)
Sodium: 135 mmol/L (ref 135–145)

## 2024-01-13 LAB — RAPID URINE DRUG SCREEN, HOSP PERFORMED
Amphetamines: NOT DETECTED
Barbiturates: NOT DETECTED
Benzodiazepines: NOT DETECTED
Cocaine: NOT DETECTED
Opiates: NOT DETECTED
Tetrahydrocannabinol: NOT DETECTED

## 2024-01-13 LAB — COMPREHENSIVE METABOLIC PANEL WITH GFR
ALT: 167 U/L — ABNORMAL HIGH (ref 0–44)
AST: 437 U/L — ABNORMAL HIGH (ref 15–41)
Albumin: 2.3 g/dL — ABNORMAL LOW (ref 3.5–5.0)
Alkaline Phosphatase: 66 U/L (ref 38–126)
Anion gap: 16 — ABNORMAL HIGH (ref 5–15)
BUN: 64 mg/dL — ABNORMAL HIGH (ref 6–20)
CO2: 17 mmol/L — ABNORMAL LOW (ref 22–32)
Calcium: 7.1 mg/dL — ABNORMAL LOW (ref 8.9–10.3)
Chloride: 100 mmol/L (ref 98–111)
Creatinine, Ser: 10.14 mg/dL — ABNORMAL HIGH (ref 0.61–1.24)
GFR, Estimated: 5 mL/min — ABNORMAL LOW (ref >60)
Glucose, Bld: 264 mg/dL — ABNORMAL HIGH (ref 70–99)
Potassium: 3.4 mmol/L — ABNORMAL LOW (ref 3.5–5.1)
Sodium: 133 mmol/L — ABNORMAL LOW (ref 135–145)
Total Bilirubin: 1.4 mg/dL — ABNORMAL HIGH (ref 0.0–1.2)
Total Protein: 5.9 g/dL — ABNORMAL LOW (ref 6.5–8.1)

## 2024-01-13 LAB — ECHOCARDIOGRAM COMPLETE
Area-P 1/2: 4.31 cm2
Height: 72 in
S' Lateral: 2.9 cm
Weight: 3858.93 [oz_av]

## 2024-01-13 LAB — STREP PNEUMONIAE URINARY ANTIGEN: Strep Pneumo Urinary Antigen: NEGATIVE

## 2024-01-13 LAB — CK: Total CK: 10078 U/L — ABNORMAL HIGH (ref 49–397)

## 2024-01-13 LAB — APTT: aPTT: 40 s — ABNORMAL HIGH (ref 24–36)

## 2024-01-13 LAB — MAGNESIUM: Magnesium: 2.4 mg/dL (ref 1.7–2.4)

## 2024-01-13 LAB — MRSA NEXT GEN BY PCR, NASAL: MRSA by PCR Next Gen: NOT DETECTED

## 2024-01-13 MED ORDER — ENSURE PLUS HIGH PROTEIN PO LIQD
237.0000 mL | Freq: Three times a day (TID) | ORAL | Status: DC
  Administered 2024-01-13 – 2024-01-17 (×11): 237 mL via ORAL

## 2024-01-13 MED ORDER — STERILE WATER FOR INJECTION IV SOLN
INTRAVENOUS | Status: DC
  Filled 2024-01-13: qty 1000
  Filled 2024-01-13 (×3): qty 150

## 2024-01-13 MED ORDER — FAMOTIDINE 20 MG PO TABS: 20.0000 mg | ORAL_TABLET | Freq: Two times a day (BID) | ORAL | Status: DC

## 2024-01-13 MED ORDER — METRONIDAZOLE 500 MG PO TABS
500.0000 mg | ORAL_TABLET | Freq: Two times a day (BID) | ORAL | Status: DC
  Administered 2024-01-13 (×2): 500 mg via ORAL
  Filled 2024-01-13 (×2): qty 1

## 2024-01-13 MED ORDER — RENA-VITE PO TABS
1.0000 | ORAL_TABLET | Freq: Every day | ORAL | Status: DC
  Administered 2024-01-13 – 2024-01-19 (×7): 1 via ORAL
  Filled 2024-01-13 (×7): qty 1

## 2024-01-13 MED ORDER — FAMOTIDINE 20 MG PO TABS
20.0000 mg | ORAL_TABLET | Freq: Every day | ORAL | Status: DC
  Administered 2024-01-13: 20 mg via ORAL
  Filled 2024-01-13: qty 1

## 2024-01-13 MED ORDER — AZITHROMYCIN 500 MG PO TABS
500.0000 mg | ORAL_TABLET | Freq: Every day | ORAL | Status: DC
  Administered 2024-01-13 – 2024-01-20 (×8): 500 mg via ORAL
  Filled 2024-01-13 (×8): qty 1

## 2024-01-13 MED ORDER — SODIUM BICARBONATE 8.4 % IV SOLN
150.0000 meq | Freq: Once | INTRAVENOUS | Status: AC
  Administered 2024-01-13: 150 meq via INTRAVENOUS
  Filled 2024-01-13: qty 150

## 2024-01-13 MED ORDER — METRONIDAZOLE 500 MG/100ML IV SOLN
500.0000 mg | Freq: Two times a day (BID) | INTRAVENOUS | Status: DC
  Administered 2024-01-13: 500 mg via INTRAVENOUS
  Filled 2024-01-13 (×2): qty 100

## 2024-01-13 NOTE — Progress Notes (Signed)
 Echocardiogram 2D Echocardiogram has been performed.  Koleen KANDICE Popper, RDCS 01/13/2024, 3:16 PM

## 2024-01-13 NOTE — Procedures (Signed)
 Central Venous Catheter Insertion Procedure Note  Siddharth Babington  [id number]  14-Jul-1965  Date:01/13/24  Time:3:17 AM   Provider Performing:Tanaia Hawkey Layman   Procedure: Insertion of Non-tunneled Central Venous [phone]) with US  guidance (23062)   Indication(s) Hemodialysis  Consent Risks of the procedure as well as the alternatives and risks of each were explained to the patient and/or caregiver.  Consent for the procedure was obtained and is signed in the bedside chart  Anesthesia Topical only with 1% lidocaine    Timeout Verified patient identification, verified procedure, site/side was marked, verified correct patient position, special equipment/implants available, medications/allergies/relevant history reviewed, required imaging and test results available.  Sterile Technique Maximal sterile technique including full sterile barrier drape, hand hygiene, sterile gown, sterile gloves, mask, hair covering, sterile ultrasound probe cover (if used).  Procedure Description Area of catheter insertion was cleaned with chlorhexidine  and draped in sterile fashion.  With real-time ultrasound guidance a HD catheter was placed into the left internal jugular vein. Placement of wire was verified in the cross sectional and longitudinal view with u/s within the internal jugular. Nonpulsatile blood flow and easy flushing noted in all ports.  The catheter was sutured in place and sterile dressing applied.  Complications/Tolerance None; patient tolerated the procedure well. Chest X-ray is ordered to verify placement for internal jugular or subclavian cannulation.   Chest x-ray is not ordered for femoral cannulation.  EBL Minimal  Specimen(s) None

## 2024-01-13 NOTE — Consult Note (Signed)
 [facility] Consult  Referring Provider: No ref. provider found Primary Care Physician:  [name], MD Primary Gastroenterologist: [facility] - Dr. Burnette  Reason for Consultation: transaminase elevation  SUBJECTIVE:   HPI: Adam Beasley is a 58 y.o. male with past medical history significant for hypertension, hyperlipidemia, diabetes mellitus.  Beginning last week on 01/07/2024 began having flulike symptoms including nonbloody diarrhea, chills, myalgia, nausea, vomiting.  He has been experiencing upper abdominal discomfort. Used TheraFlu few times.  No Tylenol  use or NSAID use.  No alcohol use.  Has been using herbal supplement Astaxanthin 4 mg PO every 3 days for the past 6 weeks. No hypotensive episodes prior to presentation, he noted that he takes his blood pressure regularly.   Colonoscopy completed 03/04/2023 for colorectal cancer screening (Dr. Burnette) - normal colonoscopy. Recommended to have repeat colonoscopy in 10 years.   Past Medical History:  Diagnosis Date   Diabetes mellitus without complication (HCC)    Hyperlipidemia    Hypertension    History reviewed. No pertinent surgical history. Prior to Admission medications   Medication Sig Start Date End Date Taking? Authorizing Provider  amLODipine  (NORVASC ) 5 MG tablet Take 1 tablet (5 mg total) by mouth daily. 08/29/23  Yes Thapa, [place], MD  atorvastatin (LIPITOR) 80 MG tablet Take 80 mg by mouth daily.   Yes [provider]  doxazosin  (CARDURA ) 2 MG tablet Take 2 mg by mouth daily.   Yes [provider]  eplerenone  (INSPRA ) 50 MG tablet Take 1 tablet (50 mg total) by mouth daily. 08/28/23  Yes Thapa, [place], MD  metFORMIN (GLUCOPHAGE) 500 MG tablet Take 500 mg by mouth daily with breakfast.   Yes [provider]  azithromycin  (ZITHROMAX ) 250 MG tablet Take 1 tablet (250 mg total) by mouth daily. 07/15/14   Terryl Kubas, NP  chlorpheniramine-HYDROcodone [facility] PENNKINETIC ER) 10-8  MG/5ML LQCR Take 5 mLs by mouth 2 (two) times daily. 07/15/14   Terryl Kubas, NP  doxazosin  (CARDURA ) 1 MG tablet Take 2 tablets (2 mg total) by mouth daily. Patient not taking: Reported on 01/12/2024 08/28/23   Thapa, [place], MD   Current Facility-Administered Medications  Medication Dose Route Frequency Provider Last Rate Last Admin   azithromycin  (ZITHROMAX ) tablet 500 mg  500 mg Oral Daily Everhart, Kirstie, DO   500 mg at 01/13/24 9078   cefTRIAXone  (ROCEPHIN ) 2 g in sodium chloride  0.9 % 100 mL IVPB  2 g Intravenous Q24H Opyd, Timothy S, MD   Stopped at 01/13/24 0847   Chlorhexidine  Gluconate Cloth 2 % PADS 6 each  6 each Topical Daily Kamat, Sunil G, MD   6 each at 01/12/24 2330   feeding supplement (ENSURE PLUS HIGH PROTEIN) liquid 237 mL  237 mL Oral TID BM Olalere, Adewale A, MD   237 mL at 01/13/24 1505   heparin  10,000 units/ 20 mL infusion syringe  500-1,000 Units/hr CRRT Continuous Geralynn Charleston, MD 1 mL/hr at 01/13/24 1500 500 Units/hr at 01/13/24 1500   heparin  injection 1,000-6,000 Units  1,000-6,000 Units CRRT PRN Geralynn Charleston, MD       heparin  injection 5,000 Units  5,000 Units Subcutaneous Q12H Tobie Mario GAILS, MD   5,000 Units at 01/13/24 1033   hydrALAZINE  (APRESOLINE ) injection 10 mg  10 mg Intravenous Q6H PRN Patel, Ekta V, MD       insulin  aspart (novoLOG ) injection 0-6 Units  0-6 Units Subcutaneous Q4H Tobie Mario GAILS, MD   1 Units at 01/13/24 1247   metroNIDAZOLE  (  FLAGYL ) tablet 500 mg  500 mg Oral Q12H Everhart, Kirstie, DO   500 mg at 01/13/24 1504   morphine  (PF) 2 MG/ML injection 2 mg  2 mg Intravenous Q4H PRN Patel, Ekta V, MD   2 mg at 01/13/24 1246   multivitamin (RENA-VIT) tablet 1 tablet  1 tablet Oral QHS Olalere, Adewale A, MD       Oral care mouth rinse  15 mL Mouth Rinse PRN Kamat, Sunil G, MD       prismasol  BGK 4/2.5 infusion   CRRT Continuous Geralynn Charleston, MD 1,500 mL/hr at 01/13/24 1442 New Bag at 01/13/24 1442   prismasol  BGK 4/2.5 infusion   CRRT  Continuous Geralynn Charleston, MD 400 mL/hr at 01/13/24 0413 New Bag at 01/13/24 0413   prismasol  BGK 4/2.5 infusion   CRRT Continuous Geralynn Charleston, MD 400 mL/hr at 01/13/24 0416 New Bag at 01/13/24 0416   sodium bicarbonate  150 mEq in sterile water  1,150 mL infusion   Intravenous Continuous Layman Raisin, DO 100 mL/hr at 01/13/24 1500 Infusion Verify at 01/13/24 1500   Allergies as of 01/12/2024   (No Known Allergies)   History reviewed. No pertinent family history. Social History   Socioeconomic History   Marital status: Single    Spouse name: Not on file   Number of children: Not on file   Years of education: Not on file   Highest education level: Not on file  Occupational History   Not on file  Tobacco Use   Smoking status: Never   Smokeless tobacco: Never  Substance and Sexual Activity   Alcohol use: No   Drug use: Not on file   Sexual activity: Not on file  Other Topics Concern   Not on file  Social History Narrative   Not on file   Social Drivers of Health   Financial Resource Strain: Not on file  Food Insecurity: No Food Insecurity (01/12/2024)   Hunger Vital Sign    Worried About Running Out of Food in the Last Year: Never true    Ran Out of Food in the Last Year: Never true  Transportation Needs: No Transportation Needs (01/12/2024)   PRAPARE - Administrator, Civil Service (Medical): No    Lack of Transportation (Non-Medical): No  Physical Activity: Not on file  Stress: Not on file  Social Connections: Not on file  Intimate Partner Violence: Not At Risk (01/12/2024)   Humiliation, Afraid, Rape, and Kick questionnaire    Fear of Current or Ex-Partner: No    Emotionally Abused: No    Physically Abused: No    Sexually Abused: No   Review of Systems:  Review of Systems  Constitutional:  Positive for malaise/fatigue.  Respiratory:  Positive for cough, sputum production and shortness of breath.   Cardiovascular:  Negative for chest pain.   Gastrointestinal:  Positive for abdominal pain, diarrhea, nausea and vomiting. Negative for blood in stool and melena.    OBJECTIVE:   Temp:  [98.2 F (36.8 C)-100.7 F (38.2 C)] 100.6 F (38.1 C) (07/28 1600) Pulse Rate:  [86-206] 93 (07/28 1500) Resp:  [19-38] 25 (07/28 1500) BP: (112-146)/(65-110) 144/90 (07/28 1500) SpO2:  [78 %-98 %] 95 % (07/28 1500) Weight:  [109.4 kg] 109.4 kg (07/28 0500) Last BM Date : 01/12/24 Physical Exam Constitutional:      General: He is not in acute distress.    Appearance: He is not ill-appearing, toxic-appearing or diaphoretic.  Cardiovascular:     Rate and  Rhythm: Regular rhythm. Tachycardia present.  Pulmonary:     Breath sounds: Normal breath sounds.  Abdominal:     General: Bowel sounds are normal. There is no distension.     Palpations: Abdomen is soft.     Tenderness: There is no abdominal tenderness. There is no guarding.  Skin:    General: Skin is warm and dry.  Neurological:     Mental Status: He is alert.     Labs: Recent Labs    01/12/24 1119 01/12/24 1200 01/12/24 1441 01/13/24 0512  WBC 7.5  --   --  6.3  HGB 11.7* 11.2* 10.2* 10.7*  HCT 32.5* 33.0* 30.0* 29.4*  PLT 177  --   --  179   BMET Recent Labs    01/12/24 1341 01/12/24 1441 01/13/24 0512 01/13/24 0514  NA 129* 131* 133* 133*  K 3.7 3.8 3.4* 3.4*  CL 95*  --  100 100  CO2 14*  --  17* 17*  GLUCOSE 305*  --  264* 257*  BUN 51*  --  64* 63*  CREATININE 8.81*  --  10.14* 10.17*  CALCIUM 7.9*  --  7.1* 7.1*   LFT Recent Labs    01/12/24 1119 01/13/24 0512 01/13/24 0514  PROT 7.5 5.9*  --   ALBUMIN 3.6 2.3* 2.3*  AST 830* 437*  --   ALT 264* 167*  --   ALKPHOS 77 66  --   BILITOT 2.1* 1.4*  --   BILIDIR 1.4*  --   --   IBILI 0.8  --   --    PT/INR Recent Labs    01/12/24 1119 01/13/24 0512  LABPROT 14.8 15.8*  INR 1.1 1.2    Diagnostic imaging: DG Chest Port 1 View Result Date: 01/13/2024 CLINICAL DATA:  Tunneled central venous  catheter placement EXAM: PORTABLE CHEST 1 VIEW COMPARISON:  01/12/2024 FINDINGS: Left internal jugular temporary hemodialysis catheter tip seen overlying the expected brachiocephalic vein. No pneumothorax. Progressive consolidation within the left lung. Small left pleural effusion suspected. Right lung is clear. No pleural effusion on the right. Cardiac size within normal limits. No acute bone abnormality. IMPRESSION: 1. Left internal jugular temporary hemodialysis catheter tip within the brachiocephalic vein. No pneumothorax. 2. Progressive consolidation within the left lung. Associated small left pleural effusion. Electronically Signed   By: Dorethia Molt M.D.   On: 01/13/2024 03:39   CT HEAD WO CONTRAST ( ) Result Date: 01/13/2024 CLINICAL DATA:  Seizure, new-onset, no history of trauma EXAM: CT HEAD WITHOUT CONTRAST TECHNIQUE: Contiguous axial images were obtained from the base of the skull through the vertex without intravenous contrast. RADIATION DOSE REDUCTION: This exam was performed according to the departmental dose-optimization program which includes automated exposure control, adjustment of the mA and/or kV according to patient size and/or use of iterative reconstruction technique. COMPARISON:  None Available. FINDINGS: Brain: No evidence of acute infarction, hemorrhage, hydrocephalus, extra-axial collection or mass lesion/mass effect. Vascular: No hyperdense vessel or unexpected calcification. Skull: Normal. Negative for fracture or focal lesion. Sinuses/Orbits: No acute finding. IMPRESSION: No evidence of acute abnormality. Electronically Signed   By: Gilmore GORMAN Molt M.D.   On: 01/13/2024 01:04   CT CHEST ABDOMEN PELVIS WO CONTRAST Result Date: 01/12/2024 CLINICAL DATA:  Sepsis EXAM: CT CHEST, ABDOMEN AND PELVIS WITHOUT CONTRAST TECHNIQUE: Multidetector CT imaging of the chest, abdomen and pelvis was performed following the standard protocol without IV contrast. RADIATION DOSE REDUCTION:  This exam was performed according to the departmental dose-optimization program  which includes automated exposure control, adjustment of the mA and/or kV according to patient size and/or use of iterative reconstruction technique. COMPARISON:  10/25/2023 FINDINGS: CT CHEST FINDINGS Cardiovascular: Mild cardiomegaly. Minimal atheromatous vascular calcification of the thoracic aorta. Mediastinum/Nodes: Likely reactive prevascular node 1.0 cm in short axis on image 20 series 301. Lungs/Pleura: New consolidation in a substantial portion of the left upper lobe and in the superior segment left lower lobe favoring multilobar pneumonia. Associated air bronchograms without mass effect on the associated airways. Small left pleural effusion. Musculoskeletal: Subacute healing left anterior eighth rib fracture on image 99 series 302, not present/perceptible on 10/25/2023. Thoracic spondylosis. Mild bilateral gynecomastia. CT ABDOMEN PELVIS FINDINGS Hepatobiliary: Diffuse hepatic steatosis. No focal hepatic lesion identified on today's noncontrast exam. Pancreas: Unremarkable Spleen: Unremarkable Adrenals/Urinary Tract: Unremarkable Stomach/Bowel: Distal colonic and rectal air-levels favoring diarrheal process. No dilated bowel. Appendix poorly seen today. Vascular/Lymphatic: Unremarkable Reproductive: Unremarkable Other: No supplemental non-categorized findings. Musculoskeletal: Stable localized sclerosis along the iliac side of the left SI joint is probably related to arthropathy although technically nonspecific. No change from 10/25/2023. Mild lower lumbar spondylosis and degenerative disc disease. IMPRESSION: 1. New consolidation in a substantial portion of the left upper lobe and in the superior segment left lower lobe favoring multilobar pneumonia. Associated air bronchograms without mass effect on the associated airways. Small left pleural effusion. 2. Subacute healing left anterior eighth rib fracture, not present/perceptible on  10/25/2023. 3. Diffuse hepatic steatosis. 4. Distal colonic and rectal air-levels favoring diarrheal process. 5. Mild cardiomegaly. 6. Stable localized sclerosis along the iliac side of the left SI joint is probably related to arthropathy although technically nonspecific. No change from 10/25/2023. 7. Mild lower lumbar spondylosis and degenerative disc disease. Electronically Signed   By: Ryan Salvage M.D.   On: 01/12/2024 12:16   DG Chest Portable 1 View Result Date: 01/12/2024 CLINICAL DATA:  Cough. EXAM: PORTABLE CHEST 1 VIEW COMPARISON:  10/25/2023 FINDINGS: The heart size and mediastinal contours are within normal limits. Patient is partially rotated to the left. Asymmetric opacity in the left hemithorax is probably due to overlying soft tissue attenuation, with left lung infiltrate considered less likely. No pleural effusion. IMPRESSION: Asymmetric opacity in left hemithorax, probably due to overlying soft tissue attenuation, with left lung opacity considered less likely. Recommend routine PA and lateral chest radiographs for further evaluation. Electronically Signed   By: Norleen DELENA Kil M.D.   On: 01/12/2024 10:51   IMPRESSION: Transaminase elevation in predominantly hepatocellular pattern Acute kidney injury, acute renal failure - now on continuous renal replacement therapy Rhabdomyolysis Herbal supplement use, Astaxanthin Hypertension Hyperlipidemia Diabetes mellitus  PLAN: -Presume transaminase elevation largely due to rhabdomyolysis, compounded by herbal supplement use (per review of literature/[employer], Astaxanthin is component of supplement blend that has been implicated in cases of hepatotoxicity, avoid in future) -Check abdominal ultrasound for completeness  -If liver enzymes were to stagnate, would expand serologic workup -Ok to use acetaminophen , limit to < 3 gm per day  -Trend liver enzymes, continue CRRT per Nephrology   LOS: 1 day   Estefana Keas, [facility]  Gastroenterology

## 2024-01-13 NOTE — TOC CM/SW Note (Signed)
 Transition of Care [facility]) - Inpatient Brief Assessment   Patient Details  Name: Adam Beasley MRN: [id number] Date of Birth: [birth year]  Transition of Care [facility]) CM/SW Contact:    Adam Beasley, Adam Muskrat, RN Phone Number: 01/13/2024, 2:18 PM   Clinical Narrative:  Patient presented to the ED with Shortness of Breath, Fatigue, Cough, Diarrhea and  N&V X 4 days. Found to have sepsis, on IV abx. Creatinine on admit was 8.69, today at 10.17. Nephrology following, continues on CRRT at bedside.   CM spoke with patient and wife, Adam Beasley at bedside about needs for post hospital transition. From home with wife, Adam Beasley. Has four supportive children, mother and six siblings supportive. Patient originally from [place], [place]. Currently employed, independent with care and drive self. Does not have DME's at home.  PCP is [name], MD and uses [facility] on [address] in [place].     No TOC needs or recommendations noted at this time.  Patient not Medically ready for discharge.  CM will continue to follow as patient progresses with care towards discharge.         Transition of Care Asessment: Insurance and Status: Insurance coverage has been reviewed Patient has primary care physician: Yes Home environment has been reviewed: Yes Prior level of function:: Independent Prior/Current Home Services: No current home services Social Drivers of Health Review: SDOH reviewed no interventions necessary Readmission risk has been reviewed: Yes Transition of care needs: no transition of care needs at this time

## 2024-01-13 NOTE — Consult Note (Signed)
 NAME:  Adam Beasley, MRN:  [id number], DOB:  [birth year], LOS: 1 ADMISSION DATE:  01/12/2024, CONSULTATION DATE:  01/12/24  REFERRING MD:  TRH, CHIEF COMPLAINT:  aki   History of Present Illness:  58 [facility] male [facility] with c/o n/v/d x 4 days. Pt also reported sob and general fatigue at time of presentation. He denies documented fever/chills, no accompanying abdominal pain. Upon presentation pt was noted to actually have fever, was tachycardic and noted to have elevated liver enzymes with normal lactate as well as in acute renal failure with BUN/Cr ~50/>8. He was subsequently transferred to [facility] and admitted to [facility] with nephrology consult.   Sepsis protocol was initiated for the pt, started on broad spectrum abx and volume resuscitation. CT chest abd/pelvis revealed new consolidation on L lung, stigmata of diarrheal process with inflammation and air levels of the distal colon. Unfortunately, shortly after transfer to [facility] pt began having generalized tonic clonic seizure like activity with post ictal ~58min long (unresponsive). RRT called and pt was given 2 mg ativan  which ceased activity. He reportedly had some amount of emesis and subsequently had diminished BS with need for supplemental oxygen.   CCM was asked to transfer to ICU for initiation of line and CRRT as despite the BUN only reporting ~50, thought was that his uremia/aki was resulting in sz like activity. CTH pending, vas cath placement pending and cefepime appropriately stopped after event.   Upon my evaluation, pt was arousable but somnolent, wife at bedside and endorses above history. States that pt was in his normal state of health prior to his seemingly GI illness ~4-7 days ago. He additionally was having decreased PO intake during this time. No other complaints offered and pt is unable to coherently discuss HPI/ROS, but is protecting airway. They have consented to transfer to ICU and vas cath  placement.  Pertinent  Medical History  HTN Hyperlipidemia DM2 with hyperglycemia Suspected OSA  Significant Hospital Events: Including procedures, antibiotic start and stop dates in addition to other pertinent events   Presented to [facility], transferred to [facility] for admission 7/27, later transferred to ICU for initiation of crrt.   Interim History / Subjective:    Objective    Blood pressure 118/86, pulse (!) 206, temperature (!) 100.4 F (38 C), temperature source Oral, resp. rate (!) 31, height 6' (1.829 m), weight 110.1 kg, SpO2 93%.        Intake/Output Summary (Last 24 hours) at 01/13/2024 0109 Last data filed at 01/13/2024 0000 Gross per 24 hour  Intake 2809.56 ml  Output --  Net 2809.56 ml   Filed Weights   01/12/24 1621  Weight: 110.1 kg    Examination: General: appears acutely ill, reclining in bed HENT: NCAT, eomi, perrla, mucous membrane moist and pink, sclera anicteric Lungs: diminished bilaterally with coarse upper airway sounds Cardiovascular: tachycardic but without m/g/r Abdomen: soft, nt, slightly distended with hyperactive bowel sounds Extremities: no c/c/e Neuro: somnolent but arousable, not following commands consistently but no focal deficits appreciated, moves all 4 extremities spontaneously, incoherent verbal responses.  GU: deferred.   Resolved problem list   Assessment and Plan  Acute onset sz, undetermined etiology N/v/d, gastroenteritis Acute sepsis without shock Acute cap +/- aspiration event, gp/gn or atypical Acute hypoxic resp failure Acute renal failure Transaminitis High anion gap Metabolic acidosis, without elevated lactate T2dm with hyperglycemia Hyponatremia Rhabdomyolysis Suspected OSA H/o hypertension H/o hyperlipidemia -sz activity ceased with 2mg  ativan  -? Aspiration during sz  with frank emesis noted -uremia is not dramatic but Cr is noted to be >8 with minimal to no urine output -transferring to ICU for crrt per  nephrology, as thought is sz 2/2 above -cont empiric abx -f/u cx -titrate oxygen for sat >95 -3 amp bicarb and start bicarb infusion -ssi for hyperglycemia (ketones negative on ua) -cont to monitor renal function per nephro -monitor lft -coags pending -trend CK -eeg post cth (which reports states normal without acute issue) -otherwise supportive care for n/v fever -prn anti htn agents for now  -of note, pt should have sleep study once more distal from acute presentation. Noted numerous times to have sonorous respirations with apneic periods. Wife states this is typical at home as well. (Not likely related to acute issues or recent benzo dosing)   Best Practice (right click and Reselect all SmartList Selections daily)   Diet/type: NPO DVT prophylaxis prophylactic heparin   Pressure ulcer(s): N/A GI prophylaxis: famotidine  Lines: Dialysis Catheter Foley:  Yes, and it is still needed Code Status:  full code Last date of multidisciplinary goals of care discussion [--]  Labs   CBC: Recent Labs  Lab 01/12/24 1119 01/12/24 1200 01/12/24 1441  WBC 7.5  --   --   NEUTROABS 5.8  --   --   HGB 11.7* 11.2* 10.2*  HCT 32.5* 33.0* 30.0*  MCV 81.7  --   --   PLT 177  --   --     Basic Metabolic Panel: Recent Labs  Lab 01/12/24 0951 01/12/24 1200 01/12/24 1341 01/12/24 1441  NA 127* 130* 129* 131*  K 3.6 3.6 3.7 3.8  CL 91*  --  95*  --   CO2 17*  --  14*  --   GLUCOSE 328*  --  305*  --   BUN 51*  --  51*  --   CREATININE 8.69*  --  8.81*  --   CALCIUM 8.6*  --  7.9*  --   MG  --   --  2.0  --    GFR: Estimated Creatinine Clearance: 11.7 mL/min (A) (by C-G formula based on SCr of 8.81 mg/dL (H)). Recent Labs  Lab 01/12/24 0959 01/12/24 1119 01/12/24 1445 01/12/24 2049  PROCALCITON  --   --   --  17.42  WBC  --  7.5  --   --   LATICACIDVEN 1.6  --  1.6  --     Liver Function Tests: Recent Labs  Lab 01/12/24 0951 01/12/24 1119  AST 834* 830*  ALT 263*  264*  ALKPHOS 78 77  BILITOT 2.2* 2.1*  PROT 7.5 7.5  ALBUMIN 3.6 3.6   Recent Labs  Lab 01/12/24 2049  LIPASE 163*   No results for input(s): AMMONIA in the last 168 hours.  ABG    Component Value Date/Time   PHART 7.305 (L) 01/12/2024 1441   PCO2ART 30.9 (L) 01/12/2024 1441   PO2ART 76 (L) 01/12/2024 1441   HCO3 15.1 (L) 01/12/2024 1441   TCO2 16 (L) 01/12/2024 1441   ACIDBASEDEF 10.0 (H) 01/12/2024 1441   O2SAT 92 01/12/2024 1441     Coagulation Profile: Recent Labs  Lab 01/12/24 1119  INR 1.1    Cardiac Enzymes: Recent Labs  Lab 01/12/24 2049  CKTOTAL 13,277*    HbA1C: Hgb A1c MFr Bld  Date/Time Value Ref Range Status  01/12/2024 08:49 PM 7.6 (H) 4.8 - 5.6 % Final    Comment:    (NOTE) Diagnosis of Diabetes  The following HbA1c ranges recommended by the American Diabetes Association (ADA) may be used as an aid in the diagnosis of diabetes mellitus.  Hemoglobin             Suggested A1C NGSP%              Diagnosis  <5.7                   Non Diabetic  5.7-6.4                Pre-Diabetic  >6.4                   Diabetic  <7.0                   Glycemic control for                       adults with diabetes.      CBG: Recent Labs  Lab 01/12/24 1955 01/12/24 2337  GLUCAP 281* 332*    Review of Systems:   As per HPI  Past Medical History:  He,  has a past medical history of Diabetes mellitus without complication (HCC), Hyperlipidemia, and Hypertension.   Surgical History:  History reviewed. No pertinent surgical history.   Social History:   reports that he has never smoked. He has never used smokeless tobacco. He reports that he does not drink alcohol.   Family History:  His family history is not on file.   Allergies No Known Allergies   Home Medications  Prior to Admission medications   Medication Sig Start Date End Date Taking? Authorizing Provider  amLODipine  (NORVASC ) 5 MG tablet Take 1 tablet (5 mg total) by mouth  daily. 08/29/23  Yes Thapa, [place], MD  atorvastatin (LIPITOR) 80 MG tablet Take 80 mg by mouth daily.   Yes [provider]  doxazosin  (CARDURA ) 2 MG tablet Take 2 mg by mouth daily.   Yes [provider]  eplerenone  (INSPRA ) 50 MG tablet Take 1 tablet (50 mg total) by mouth daily. 08/28/23  Yes Thapa, [place], MD  metFORMIN (GLUCOPHAGE) 500 MG tablet Take 500 mg by mouth daily with breakfast.   Yes [provider]  azithromycin  (ZITHROMAX ) 250 MG tablet Take 1 tablet (250 mg total) by mouth daily. 07/15/14   Terryl Kubas, NP  chlorpheniramine-HYDROcodone [facility] PENNKINETIC ER) 10-8 MG/5ML LQCR Take 5 mLs by mouth 2 (two) times daily. 07/15/14   Terryl Kubas, NP  doxazosin  (CARDURA ) 1 MG tablet Take 2 tablets (2 mg total) by mouth daily. Patient not taking: Reported on 01/12/2024 08/28/23   Thapa, [place], MD     Critical care time: 

## 2024-01-13 NOTE — Progress Notes (Addendum)
 Update:   Will send acute hepatitis panel, heavy metal screen, toxicology and copper  at this time as well.   Pt states no recent medication changes with exception of initiation 3-4 weeks ago of herbal supplement Astaxanthin. I am unfamiliar with this supplement, however quick review appears to have potential hepatic/renal protective behaviors. Can speak with pharmacy further in am.   For work pt is a [employer], transports auto parts. Wife does not think he comes into contact with the parts and that they are usually in packaging. No known hazardous contact within work or home.   Denies etoh use (despite AST >> ALT) no illicit substance abuse.

## 2024-01-13 NOTE — Progress Notes (Signed)
 RN informed patient of increasing temp of 101.3. RN educated patient on various forms of interventions available including ice packs and cooling blanket. Attempt to place cooling blanket unsuccessful as patient refused cooling blanket.   Again, RN educated patient on the importance of intervention. Patient acknowledges teaching and understands RN recommendation and is firm with refusal.   Charge RN Redell ORN and patients wife present at beside for this discussion.

## 2024-01-13 NOTE — Progress Notes (Signed)
 500 cc NS bolus given during hd cath placement per Dr. Layman verbal order.

## 2024-01-13 NOTE — Inpatient Diabetes Management (Signed)
 Inpatient Diabetes Program Recommendations  AACE/ADA: New Consensus Statement on Inpatient Glycemic Control (2015)  Target Ranges:  Prepandial:   less than 140 mg/dL      Peak postprandial:   less than 180 mg/dL (1-2 hours)      Critically ill patients:  140 - 180 mg/dL   Lab Results  Component Value Date   GLUCAP 185 (H) 01/13/2024   HGBA1C 7.6 (H) 01/12/2024    Review of Glycemic Control  Latest Reference Range & Units 01/13/24 03:52 01/13/24 07:53 01/13/24 11:28  Glucose-Capillary 70 - 99 mg/dL 746 (H) 819 (H) 814 (H)   Diabetes history: DM  Outpatient Diabetes medications:  Metformin 500 mg daily Current orders for Inpatient glycemic control:  Novolog  0-6 units q 4 hours Inpatient Diabetes Program Recommendations:    Consider adding low dose basal insulin - Semglee  5 units bid.    Thanks,  Randall Bullocks, RN, BC-ADM Inpatient Diabetes Coordinator Pager [phone]  (8a-5p)

## 2024-01-13 NOTE — Progress Notes (Signed)
 NAME:  Adam Beasley, MRN:  [id number], DOB:  02-16-1966, LOS: 1 ADMISSION DATE:  01/12/2024  History of Present Illness:  57 yo male [facility] with c/o n/v/d x 4 days. Pt also reported sob and general fatigue at time of presentation. He denies documented fever/chills, no accompanying abdominal pain. Upon presentation pt was noted to actually have fever, was tachycardic and noted to have elevated liver enzymes with normal lactate as well as in acute renal failure with BUN/Cr ~50/>8. He was subsequently transferred to [facility] and admitted to [facility] with nephrology consult.    Sepsis protocol was initiated for the pt, started on broad spectrum abx and volume resuscitation. CT chest abd/pelvis revealed new consolidation on L lung, stigmata of diarrheal process with inflammation and air levels of the distal colon. Unfortunately, shortly after transfer to [facility] pt began having generalized tonic clonic seizure like activity with post ictal ~64min long (unresponsive). RRT called and pt was given 2 mg ativan  which ceased activity. He reportedly had some amount of emesis and subsequently had diminished BS with need for supplemental oxygen.    CCM was asked to transfer to ICU for initiation of line and CRRT as despite the BUN only reporting ~50, thought was that his uremia/aki was resulting in sz like activity. CTH pending, vas cath placement pending and cefepime appropriately stopped after event.    Pt reportedly was in his normal state of health prior to his seemingly GI illness ~4-7 days ago. He additionally was having decreased PO intake during this time. No other complaints offered and pt is unable to coherently discuss HPI/ROS, but is protecting airway. They have consented to transfer to ICU and vas cath placement.  Pertinent  Medical History  HTN Hyperlipidemia DM2 with hyperglycemia Suspected OSA  Significant Hospital Events: Including procedures, antibiotic start and stop dates in  addition to other pertinent events   7/27 - Presented to [facility], transferred to [facility] for admission, later transferred to ICU for initiation of crrt.   Interim History / Subjective:  NAEON. Diarrhea improved, no pain  Objective    Blood pressure (!) 133/93, pulse 88, temperature 98.8 F (37.1 C), temperature source Oral, resp. rate (!) 21, height 6' (1.829 m), weight 109.4 kg, SpO2 97%.        Intake/Output Summary (Last 24 hours) at 01/13/2024 1106 Last data filed at 01/13/2024 1000 Gross per 24 hour  Intake 5216.48 ml  Output 777.4 ml  Net 4439.08 ml   Filed Weights   01/12/24 1621 01/13/24 0500  Weight: 110.1 kg 109.4 kg    Examination: General: no acute distress, resting comfortably wife at bedside HENT: MMM, EOMI Lungs: breathing comfortably on 4L [place], slightly diminished on the left Cardiovascular: RRR, no murmur Abdomen: Soft, non-distended, bowel sounds present, no TTP Neuro: alert and oriented, follows commands, holds conversation  Resolved problem list   Assessment and Plan  Acute renal failure Rhabdomyolysis Unclear etiology, consider volume depletion in setting of recent illness and rhabdomyolysis with elevated CK. Baseline creatinine normal, 8.6 on admission increased to 10.1 this morning.  Nephrology following CRRT started Monitor RFP Monitor UOP Trend CK mIVF at 182ml/h  Sepsis High AG metabolic acidosis N/V/D Obtain GIPP, contact precautions  Supportive care  F/u urine culture, blood culture Sodium bicarb 155ml/h  New onset seizure Resolved with Ativan , no further seizure like activity. Suspected secondary to uremia, metabolic derangement  CTM Seizure precautions   Acute hypoxic respiratory failure CT chest concerning for multilobar  pneumonia, possible aspiration during seizure  Continue Ceftriaxone  (7/28-), Azithromycin  (7/28-), Metronidazole  (7/28-) Wean O2 as able Aspiration precautions  F/u legionella, strep pneumoniae    Transaminitis Improved this morning CTM F/u hepatitis panel, heavy metal screen, toxicology, and copper  level   T2DM with hyperglycemia Holding home metformin SSI very sensitive   Suspected OSA Apneic periods during breathing noted during hospitalization Consider OP sleep study   Hx HTN, HLD Holding home lipitor, eplerenone , doxazosin , amlodipine  for now PRN hydralazine  for SBP >160  Best Practice (right click and Reselect all SmartList Selections daily)   Diet/type: Regular consistency (see orders) DVT prophylaxis prophylactic heparin   Pressure ulcer(s): N/A GI prophylaxis: N/A Lines: Dialysis Catheter Foley:  N/A Code Status:  full code Last date of multidisciplinary goals of care discussion []   Labs   CBC: Recent Labs  Lab 01/12/24 1119 01/12/24 1200 01/12/24 1441 01/13/24 0512  WBC 7.5  --   --  6.3  NEUTROABS 5.8  --   --   --   HGB 11.7* 11.2* 10.2* 10.7*  HCT 32.5* 33.0* 30.0* 29.4*  MCV 81.7  --   --  82.1  PLT 177  --   --  179    Basic Metabolic Panel: Recent Labs  Lab 01/12/24 0951 01/12/24 1200 01/12/24 1341 01/12/24 1441 01/13/24 0512 01/13/24 0514  NA 127* 130* 129* 131* 133* 133*  K 3.6 3.6 3.7 3.8 3.4* 3.4*  CL 91*  --  95*  --  100 100  CO2 17*  --  14*  --  17* 17*  GLUCOSE 328*  --  305*  --  264* 257*  BUN 51*  --  51*  --  64* 63*  CREATININE 8.69*  --  8.81*  --  10.14* 10.17*  CALCIUM 8.6*  --  7.9*  --  7.1* 7.1*  MG  --   --  2.0  --  2.4  --   PHOS  --   --   --   --   --  5.6*   GFR: Estimated Creatinine Clearance: 10.1 mL/min (A) (by C-G formula based on SCr of 10.17 mg/dL (H)). Recent Labs  Lab 01/12/24 0959 01/12/24 1119 01/12/24 1445 01/12/24 2049 01/13/24 0512  PROCALCITON  --   --   --  17.42  --   WBC  --  7.5  --   --  6.3  LATICACIDVEN 1.6  --  1.6  --   --     Liver Function Tests: Recent Labs  Lab 01/12/24 0951 01/12/24 1119 01/13/24 0512 01/13/24 0514  AST 834* 830* 437*  --   ALT 263*  264* 167*  --   ALKPHOS 78 77 66  --   BILITOT 2.2* 2.1* 1.4*  --   PROT 7.5 7.5 5.9*  --   ALBUMIN 3.6 3.6 2.3* 2.3*   Recent Labs  Lab 01/12/24 2049  LIPASE 163*   Recent Labs  Lab 01/13/24 0512  AMMONIA 46*    ABG    Component Value Date/Time   PHART 7.305 (L) 01/12/2024 1441   PCO2ART 30.9 (L) 01/12/2024 1441   PO2ART 76 (L) 01/12/2024 1441   HCO3 15.1 (L) 01/12/2024 1441   TCO2 16 (L) 01/12/2024 1441   ACIDBASEDEF 10.0 (H) 01/12/2024 1441   O2SAT 92 01/12/2024 1441     Coagulation Profile: Recent Labs  Lab 01/12/24 1119 01/13/24 0512  INR 1.1 1.2    Cardiac Enzymes: Recent Labs  Lab 01/12/24 2049 01/13/24 9487  CKTOTAL 13,277* 10,078*    HbA1C: Hgb A1c MFr Bld  Date/Time Value Ref Range Status  01/12/2024 08:49 PM 7.6 (H) 4.8 - 5.6 % Final    Comment:    (NOTE) Diagnosis of Diabetes The following HbA1c ranges recommended by the American Diabetes Association (ADA) may be used as an aid in the diagnosis of diabetes mellitus.  Hemoglobin             Suggested A1C NGSP%              Diagnosis  <5.7                   Non Diabetic  5.7-6.4                Pre-Diabetic  >6.4                   Diabetic  <7.0                   Glycemic control for                       adults with diabetes.      CBG: Recent Labs  Lab 01/12/24 1955 01/12/24 2337 01/13/24 0352 01/13/24 0753  GLUCAP 281* 332* 253* 180*    Review of Systems:   N/a  Past Medical History:  He,  has a past medical history of Diabetes mellitus without complication (HCC), Hyperlipidemia, and Hypertension.   Surgical History:  History reviewed. No pertinent surgical history.   Social History:   reports that he has never smoked. He has never used smokeless tobacco. He reports that he does not drink alcohol.   Family History:  His family history is not on file.   Allergies No Known Allergies   Home Medications  Prior to Admission medications   Medication Sig Start  Date End Date Taking? Authorizing Provider  amLODipine  (NORVASC ) 5 MG tablet Take 1 tablet (5 mg total) by mouth daily. 08/29/23  Yes Thapa, [place], MD  atorvastatin (LIPITOR) 80 MG tablet Take 80 mg by mouth daily.   Yes [provider]  doxazosin  (CARDURA ) 2 MG tablet Take 2 mg by mouth daily.   Yes [provider]  eplerenone  (INSPRA ) 50 MG tablet Take 1 tablet (50 mg total) by mouth daily. 08/28/23  Yes Thapa, [place], MD  metFORMIN (GLUCOPHAGE) 500 MG tablet Take 500 mg by mouth daily with breakfast.   Yes [provider]  azithromycin  (ZITHROMAX ) 250 MG tablet Take 1 tablet (250 mg total) by mouth daily. 07/15/14   Terryl Kubas, NP  chlorpheniramine-HYDROcodone [facility] PENNKINETIC ER) 10-8 MG/5ML LQCR Take 5 mLs by mouth 2 (two) times daily. 07/15/14   Terryl Kubas, NP  doxazosin  (CARDURA ) 1 MG tablet Take 2 tablets (2 mg total) by mouth daily. Patient not taking: Reported on 01/12/2024 08/28/23   Thapa, [place], MD     Critical care time: 30 minutes

## 2024-01-13 NOTE — Progress Notes (Signed)
 Initial Nutrition Assessment  DOCUMENTATION CODES:  Not applicable  INTERVENTION:  Continue regular diet as ordered Would not recommend dietary restrictions at this time while patient is on CRRT as CRRT is known to cause reduction in levels of potassium and phosphorus resulting in deficiencies  Ensure Plus High Protein po TID, each supplement provides 350 kcal and 20 grams of protein. Encouraged smaller, more frequent oral intake given insufficient appetite Renal MVI with minerals daily  NUTRITION DIAGNOSIS:  Inadequate oral intake related to acute illness as evidenced by per patient/family report.  GOAL:  Patient will meet greater than or equal to 90% of their needs  MONITOR:  PO intake, Supplement acceptance, Labs, Weight trends, I & O's  REASON FOR ASSESSMENT:  Consult Assessment of nutrition requirement/status (CRRT)  ASSESSMENT:  Pt from home with 4 days of nausea, vomiting, diarrhea. PMH significant for HTN, DM2.  7/27: admitted, CRRT initiated   CT chest/abd/pelvis- new consolidation on L lung, stigmata of diarrheal process with inflammation and air levels of the distal colon  Tonic clonic seizure like activity with postictal state upon transfer to [facility], suspected secondary to uremia/metabolic derangements.   Spoke with patient at bedside. He is notably drowsy at time of visit though answers questions appropriately.  He states that for 4 days PTA he was not taking any oral intake aside from a little bit of water  d/t nausea, vomiting and diarrhea. All but the diarrhea have resolved. He typically eats really well and enjoys fruits such as grapes and bananas, though his significant other mentions that he was encouraged not to consume bananas.   Explained to pt that given requirement of CRRT at this time and acute illness, his nutrition needs are elevated from that of his baseline. Encouraged smaller, more frequent oral intake with emphasis on protein containing  foods until appetite is better improved. He is amenable to receive nutrition supplements to augment intake.   Meal completions: 7/28: 50% breakfast  Pt unable to recall his usual weight but feels that his weight has declined over the last 4-5 days d/t acute illness.  Per review of weight history within the last year, pt's weight overall appears stable between 109-111 kg with 106 kg.   Drains/lines: Non-tunneled HD cath  Medications: abx, SSI 0-6 units q4h  Labs: Sodium 133 Potassium 3.4 BUN 63 Cr 10.17 Corrected calcium 8.46 Anion gap 16 Phosphorus 5.6 AST 437 ALT 167 Ammonia 46 GFR 5 CBG's 180-332 x24 hours HgbA1c 7.6% (7/27)  NUTRITION - FOCUSED PHYSICAL EXAM:  Flowsheet Row Most Recent Value  Orbital Region No depletion  Upper Arm Region No depletion  Thoracic and Lumbar Region No depletion  Buccal Region No depletion  Temple Region No depletion  Clavicle Bone Region No depletion  Clavicle and Acromion Bone Region No depletion  Scapular Bone Region No depletion  Dorsal Hand No depletion  Patellar Region No depletion  Anterior Thigh Region No depletion  Posterior Calf Region No depletion  Edema (RD Assessment) Mild  [generalized]  Hair Reviewed  Eyes Reviewed  Mouth Reviewed  Skin Reviewed  Nails Reviewed    Diet Order:   Diet Order             Diet regular Room service appropriate? Yes; Fluid consistency: Thin  Diet effective now                   EDUCATION NEEDS:   Education needs have been addressed  Skin:  Skin Assessment: Reviewed RN Assessment  Last BM:  7/28 type 7 medium  Height:   Ht Readings from Last 1 Encounters:  01/12/24 6' (1.829 m)    Weight:   Wt Readings from Last 1 Encounters:  01/13/24 109.4 kg    Ideal Body Weight:  80.9 kg  BMI:  Body mass index is 32.71 kg/m.  Estimated Nutritional Needs:   Kcal:  2200-2400  Protein:  145-160g  Fluid:  1 L + UOP  Royce Maris, RDN, LDN Clinical Nutrition See  AMiON for contact information.

## 2024-01-13 NOTE — Progress Notes (Signed)
 eLink Physician-Brief Progress Note Patient Name: Adam Beasley DOB: [birth year] MRN: [id number]   Date of Service  01/13/2024  HPI/Events of Note  Patient brought to the ICU for management of tonic-clonic seizure in the setting of acute uremia currently on CRRT.  Febrile at home around 3.1, ice packs placed  eICU Interventions  Cooling blanket    0228 -add antitussive PRN  Intervention Category Minor Interventions: Routine modifications to care plan (e.g. PRN medications for pain, fever)  Faiz Weber 01/13/2024, 9:41 PM

## 2024-01-13 NOTE — Progress Notes (Signed)
 Pt temp above 101. CRRT blood warmer off by pt shift RN prior. Pt removing Ice packs, refusing cooling blanket as alternative, and refusing bath at this time to cool down. Pt educated by shift RN and this RN about the possibility of pt harm if temperature becomes worse and is maintained in abnormal ranges. States It'll be fine. Pt AOx4.

## 2024-01-13 NOTE — Progress Notes (Signed)
 Adam Beasley is an 58 y.o. male w/ PMH as below who presented early 7/27 morning c/o cough, diarrhea and N/V for the last 4 days. Also SOB and fatigue. In the ED temp 103, RR 20, HR 121, BP 160/90. [place] 2 L at 93%.  Labs showed Na 130, K 3.6, CO2 14, bun 51, creat 8.81 (0.9 in may 2025) with hosp course complicated by seizures, CT showed new consolidation in LUL and LLL.  Assessment/Plan: AKI, severe: w/ normal b/l creat 0.9- 1.0 from jan and may 2025, eGFR 96 ml/min. Creat here is 8.6 in the setting of an acute resp illness x 1 week, w/ multifocal PNA most likely per CT. No acei/ ARB at home, BP's stable here. No nephrotoxins. On exam looks euvolemic, possibly a bit dry. CT showed bilat kidneys w/o obstruction. UA showed ^protein but no rbcs/ wbcs. Patient just had a significant seizure episode, this could possibly due to uremia. Not sure cause of AKI, vol depletion probably played a role initially but likely ATN from rhabdo (CPK 10k). Due to the seizures, which could be uremia-related, pt started on CRRT 7/27.  - Not making much urine; should be able to stop the NS and eventually HCO3 based therapy once metabolic acidosis resolves. - Seen on CRRT 4K baths, change from +78ml/hr to even (he's +5L).   -Monitor Daily I/Os, Daily weight  -Maintain MAP>65 for optimal renal perfusion.  - Avoid nephrotoxic agents such as IV contrast, NSAIDs, and phosphate containing bowel preps (FLEETS)  Multifocal PNA: per CT, on IV abx per pmd Acute seizures: tonight, RR rx'd w/ IV ativan .   Subjective: Spouse bedside; poor appetite consuming about 25% of meal, hypertensive, very poor UOP with foley to gravity.   Chemistry and CBC: Creat  Date/Time Value Ref Range Status  07/16/2023 03:15 PM 0.93 0.70 - 1.30 mg/dL Final  87/97/7975 96:47 PM 0.95 0.70 - 1.30 mg/dL Final   Creatinine, Ser  Date/Time Value Ref Range Status  01/13/2024 05:14 AM 10.17 (H) 0.61 - 1.24 mg/dL Final  92/71/7974 94:87 AM 10.14 (H) 0.61  - 1.24 mg/dL Final  92/72/7974 98:58 PM 8.81 (H) 0.61 - 1.24 mg/dL Final  92/72/7974 90:48 AM 8.69 (H) 0.61 - 1.24 mg/dL Final  94/90/7974 97:80 PM 1.06 0.61 - 1.24 mg/dL Final  98/72/7983 88:59 PM 1.04 0.50 - 1.35 mg/dL Final   Recent Labs  Lab 01/12/24 0951 01/12/24 1200 01/12/24 1341 01/12/24 1441 01/13/24 0512 01/13/24 0514  NA 127* 130* 129* 131* 133* 133*  K 3.6 3.6 3.7 3.8 3.4* 3.4*  CL 91*  --  95*  --  100 100  CO2 17*  --  14*  --  17* 17*  GLUCOSE 328*  --  305*  --  264* 257*  BUN 51*  --  51*  --  64* 63*  CREATININE 8.69*  --  8.81*  --  10.14* 10.17*  CALCIUM 8.6*  --  7.9*  --  7.1* 7.1*  PHOS  --   --   --   --   --  5.6*   Recent Labs  Lab 01/12/24 1119 01/12/24 1200 01/12/24 1441 01/13/24 0512  WBC 7.5  --   --  6.3  NEUTROABS 5.8  --   --   --   HGB 11.7* 11.2* 10.2* 10.7*  HCT 32.5* 33.0* 30.0* 29.4*  MCV 81.7  --   --  82.1  PLT 177  --   --  179   Liver Function Tests:  Recent Labs  Lab 01/12/24 0951 01/12/24 1119 01/13/24 0512 01/13/24 0514  AST 834* 830* 437*  --   ALT 263* 264* 167*  --   ALKPHOS 78 77 66  --   BILITOT 2.2* 2.1* 1.4*  --   PROT 7.5 7.5 5.9*  --   ALBUMIN 3.6 3.6 2.3* 2.3*   Recent Labs  Lab 01/12/24 2049  LIPASE 163*   Recent Labs  Lab 01/13/24 0512  AMMONIA 46*   Cardiac Enzymes: Recent Labs  Lab 01/12/24 2049 01/13/24 0512  CKTOTAL 13,277* 10,078*   Iron Studies: No results for input(s): IRON, TIBC, TRANSFERRIN, FERRITIN in the last 72 hours. PT/INR: @LABRCNTIP (inr:5)  Xrays/Other Studies: ) Results for orders placed or performed during the hospital encounter of 01/12/24 (from the past 48 hours)  MRSA Next Gen by PCR, Nasal     Status: None   Collection Time: 01/12/24 12:14 AM   Specimen: Nasal Mucosa; Nasal Swab  Result Value Ref Range   MRSA by PCR Next Gen NOT DETECTED NOT DETECTED    Comment: (NOTE) The GeneXpert MRSA Assay (FDA approved for NASAL specimens only), is one component  of a comprehensive MRSA colonization surveillance program. It is not intended to diagnose MRSA infection nor to guide or monitor treatment for MRSA infections. Test performance is not FDA approved in patients less than 36 years old. Performed at [facility], [address] [place], [place] [address]   Comprehensive metabolic panel     Status: Abnormal   Collection Time: 01/12/24  9:51 AM  Result Value Ref Range   Sodium 127 (L) 135 - 145 mmol/L   Potassium 3.6 3.5 - 5.1 mmol/L   Chloride 91 (L) 98 - 111 mmol/L   CO2 17 (L) 22 - 32 mmol/L   Glucose, Bld 328 (H) 70 - 99 mg/dL    Comment: Glucose reference range applies only to samples taken after fasting for at least 8 hours.   BUN 51 (H) 6 - 20 mg/dL   Creatinine, Ser 1.30 (H) 0.61 - 1.24 mg/dL   Calcium 8.6 (L) 8.9 - 10.3 mg/dL   Total Protein 7.5 6.5 - 8.1 g/dL   Albumin 3.6 3.5 - 5.0 g/dL   AST 165 (H) 15 - 41 U/L   ALT 263 (H) 0 - 44 U/L   Alkaline Phosphatase 78 38 - 126 U/L   Total Bilirubin 2.2 (H) 0.0 - 1.2 mg/dL   GFR, Estimated 7 (L) >60 mL/min    Comment: (NOTE) Calculated using the CKD-EPI Creatinine Equation (2021)    Anion gap 18 (H) 5 - 15    Comment: Performed at [facility], [address] [place], [place] [address]  Beta-hydroxybutyric acid     Status: None   Collection Time: 01/12/24  9:51 AM  Result Value Ref Range   Beta-Hydroxybutyric Acid 0.26 0.05 - 0.27 mmol/L    Comment: Performed at [facility], [address], [place], [place] [address]  Lactic acid, plasma     Status: None   Collection Time: 01/12/24  9:59 AM  Result Value Ref Range   Lactic Acid, Venous 1.6 0.5 - 1.9 mmol/L    Comment: Performed at [facility], [address] [place], [place] [address]  Hepatitis panel, acute     Status: None   Collection Time: 01/12/24 10:00 AM  Result Value Ref Range   Hepatitis B Surface Ag NON REACTIVE NON REACTIVE   HCV  Ab NON REACTIVE NON REACTIVE    Comment:  (NOTE) Nonreactive HCV antibody screen is consistent with no HCV infections,  unless recent infection is suspected or other evidence exists to indicate HCV infection.     Hep A IgM NON REACTIVE NON REACTIVE   Hep B C IgM NON REACTIVE NON REACTIVE    Comment: Performed at [facility], [address] [place], [place] [address]  Blood Culture (routine x 2)     Status: None (Preliminary result)   Collection Time: 01/12/24 10:01 AM   Specimen: BLOOD  Result Value Ref Range   Specimen Description      BLOOD RIGHT ANTECUBITAL Performed at [facility], [address] [place], [place] [address]    Special Requests      BOTTLES DRAWN AEROBIC AND ANAEROBIC Blood Culture adequate volume Performed at [facility], [address] [place], [place] [address]    Culture      NO GROWTH < 24 HOURS Performed at [facility], [address], [place], [place] [address]    Report Status PENDING   Blood Culture (routine x 2)     Status: None (Preliminary result)   Collection Time: 01/12/24 10:12 AM   Specimen: BLOOD  Result Value Ref Range   Specimen Description      BLOOD LEFT ANTECUBITAL Performed at [facility], [address] [place], [place] [address]    Special Requests      BOTTLES DRAWN AEROBIC AND ANAEROBIC Blood Culture adequate volume Performed at [facility], [address] [place], [place] [address]    Culture      NO GROWTH < 24 HOURS Performed at [facility], [address] [place], [place] [address]    Report Status PENDING   CBC with Differential     Status: Abnormal   Collection Time: 01/12/24 11:19 AM  Result Value Ref Range   WBC 7.5 4.0 - 10.5 K/uL   RBC 3.98 (L) 4.22 - 5.81 MIL/uL   Hemoglobin 11.7 (L) 13.0 - 17.0 g/dL   HCT 67.4 (L) 60.9 - 47.9 %   MCV 81.7 80.0 - 100.0 fL   MCH 29.4 26.0 - 34.0 pg   MCHC 36.0 30.0 - 36.0 g/dL   RDW 86.5 88.4 - 84.4 %   Platelets 177 150 - 400 K/uL   nRBC  0.0 0.0 - 0.2 %   Neutrophils Relative % 77 %   Neutro Abs 5.8 1.7 - 7.7 K/uL   Lymphocytes Relative 11 %   Lymphs Abs 0.8 0.7 - 4.0 K/uL   Monocytes Relative 10 %   Monocytes Absolute 0.7 0.1 - 1.0 K/uL   Eosinophils Relative 0 %   Eosinophils Absolute 0.0 0.0 - 0.5 K/uL   Basophils Relative 0 %   Basophils Absolute 0.0 0.0 - 0.1 K/uL   Immature Granulocytes 2 %   Abs Immature Granulocytes 0.12 (H) 0.00 - 0.07 K/uL    Comment: Performed at [facility], [address] [place], [place] [address]  Protime-INR     Status: None   Collection Time: 01/12/24 11:19 AM  Result Value Ref Range   Prothrombin Time 14.8 11.4 - 15.2 seconds   INR 1.1 0.8 - 1.2    Comment: (NOTE) INR goal varies based on device and disease states. Performed at [facility], [address], [place], [place] [address]  Acetaminophen  level     Status: Abnormal   Collection Time: 01/12/24 11:19 AM  Result Value Ref Range   Acetaminophen  (Tylenol ), Serum <10 (L) 10 - 30 ug/mL    Comment: (NOTE) Toxic concentrations can be more effectively related to post dose interval; > 200, > 100, and > 50 ug/mL serum concentrations correspond to toxic concentrations at 4, 8, and 12 hours post dose, respectively.  Performed at [facility], [address] [place], [place] [address]   Ethanol     Status: None   Collection Time: 01/12/24 11:19 AM  Result Value Ref Range   Alcohol, Ethyl (B) <15 <15 mg/dL    Comment: (NOTE) For medical purposes only. Performed at [facility], [address] [place], [place] [address]   Salicylate level     Status: Abnormal   Collection Time: 01/12/24 11:19 AM  Result Value Ref Range   Salicylate Lvl <7.0 (L) 7.0 - 30.0 mg/dL    Comment: Performed at [facility], [address] [place], [place] [address]  Hepatic function panel     Status: Abnormal   Collection Time: 01/12/24 11:19 AM  Result Value Ref Range   Total  Protein 7.5 6.5 - 8.1 g/dL   Albumin 3.6 3.5 - 5.0 g/dL   AST 169 (H) 15 - 41 U/L   ALT 264 (H) 0 - 44 U/L   Alkaline Phosphatase 77 38 - 126 U/L   Total Bilirubin 2.1 (H) 0.0 - 1.2 mg/dL   Bilirubin, Direct 1.4 (H) 0.0 - 0.2 mg/dL   Indirect Bilirubin 0.8 0.3 - 0.9 mg/dL    Comment: Performed at [facility], [address] [place], [place] [address]  I-Stat venous blood gas, ED     Status: Abnormal   Collection Time: 01/12/24 12:00 PM  Result Value Ref Range   pH, Ven 7.376 7.25 - 7.43   pCO2, Ven 27.0 (L) 44 - 60 mmHg   pO2, Ven 57 (H) 32 - 45 mmHg   Bicarbonate 15.6 (L) 20.0 - 28.0 mmol/L   TCO2 16 (L) 22 - 32 mmol/L   O2 Saturation 87 %   Acid-base deficit 8.0 (H) 0.0 - 2.0 mmol/L   Sodium 130 (L) 135 - 145 mmol/L   Potassium 3.6 3.5 - 5.1 mmol/L   Calcium, Ion 0.97 (L) 1.15 - 1.40 mmol/L   HCT 33.0 (L) 39.0 - 52.0 %   Hemoglobin 11.2 (L) 13.0 - 17.0 g/dL   Patient temperature 898.3 F    Collection site IV start    Drawn by Nurse    Sample type VENOUS   Resp panel by RT-PCR (RSV, Flu A&B, Covid) Anterior Nasal Swab     Status: None   Collection Time: 01/12/24 12:30 PM   Specimen: Anterior Nasal Swab  Result Value Ref Range   SARS Coronavirus 2 by RT PCR NEGATIVE NEGATIVE    Comment: (NOTE) SARS-CoV-2 target nucleic acids are NOT DETECTED.  The SARS-CoV-2 RNA is generally detectable in upper respiratory specimens during the acute phase of infection. The lowest concentration of SARS-CoV-2 viral copies this assay can detect is 138 copies/mL. A negative result does not preclude SARS-Cov-2 infection and should not be used as the sole basis for treatment or other patient management decisions. A negative result may occur with  improper specimen collection/handling, submission of specimen other than nasopharyngeal swab, presence of viral mutation(s) within the areas targeted by this assay, and  inadequate number of viral copies(<138 copies/mL). A negative result  must be combined with clinical observations, patient history, and epidemiological information. The expected result is Negative.  Fact Sheet for Patients:  BloggerCourse.com  Fact Sheet for Healthcare Providers:  SeriousBroker.it  This test is no t yet approved or cleared by the [place]  FDA and  has been authorized for detection and/or diagnosis of SARS-CoV-2 by FDA under an Emergency Use Authorization (EUA). This EUA will remain  in effect (meaning this test can be used) for the duration of the COVID-19 declaration under Section 564(b)(1) of the Act, 21 U.S.C.section 360bbb-3(b)(1), unless the authorization is terminated  or revoked sooner.       Influenza A by PCR NEGATIVE NEGATIVE   Influenza B by PCR NEGATIVE NEGATIVE    Comment: (NOTE) The Xpert Xpress SARS-CoV-2/FLU/RSV plus assay is intended as an aid in the diagnosis of influenza from Nasopharyngeal swab specimens and should not be used as a sole basis for treatment. Nasal washings and aspirates are unacceptable for Xpert Xpress SARS-CoV-2/FLU/RSV testing.  Fact Sheet for Patients: BloggerCourse.com  Fact Sheet for Healthcare Providers: SeriousBroker.it  This test is not yet approved or cleared by the [place]  FDA and has been authorized for detection and/or diagnosis of SARS-CoV-2 by FDA under an Emergency Use Authorization (EUA). This EUA will remain in effect (meaning this test can be used) for the duration of the COVID-19 declaration under Section 564(b)(1) of the Act, 21 U.S.C. section 360bbb-3(b)(1), unless the authorization is terminated or revoked.     Resp Syncytial Virus by PCR NEGATIVE NEGATIVE    Comment: (NOTE) Fact Sheet for Patients: BloggerCourse.com  Fact Sheet for Healthcare Providers: SeriousBroker.it  This test is not yet approved or  cleared by the [place]  FDA and has been authorized for detection and/or diagnosis of SARS-CoV-2 by FDA under an Emergency Use Authorization (EUA). This EUA will remain in effect (meaning this test can be used) for the duration of the COVID-19 declaration under Section 564(b)(1) of the Act, 21 U.S.C. section 360bbb-3(b)(1), unless the authorization is terminated or revoked.  Performed at [facility], [address] [place], [place] [address]   Urinalysis, w/ Reflex to Culture (Infection Suspected) -Urine, Clean Catch     Status: Abnormal   Collection Time: 01/12/24  1:38 PM  Result Value Ref Range   Specimen Source URINE, CLEAN CATCH    Color, Urine AMBER (A) YELLOW    Comment: BIOCHEMICALS MAY BE AFFECTED BY COLOR   APPearance CLOUDY (A) CLEAR   Specific Gravity, Urine >=1.030 1.005 - 1.030   pH 5.0 5.0 - 8.0   Glucose, UA 250 (A) NEGATIVE mg/dL   Hgb urine dipstick LARGE (A) NEGATIVE   Bilirubin Urine MODERATE (A) NEGATIVE   Ketones, ur NEGATIVE NEGATIVE mg/dL   Protein, ur >=699 (A) NEGATIVE mg/dL   Nitrite NEGATIVE NEGATIVE   Leukocytes,Ua NEGATIVE NEGATIVE   Squamous Epithelial / HPF 0-5 0 - 5 /HPF   WBC, UA 0-5 0 - 5 WBC/hpf    Comment: Reflex urine culture not performed if WBC <=10, OR if Squamous epithelial cells >5. If Squamous epithelial cells >5, suggest recollection.   RBC / HPF 0-5 0 - 5 RBC/hpf   Bacteria, UA MANY (A) NONE SEEN   Mucus PRESENT    Sperm, UA PRESENT    Urine-Other LESS THAN 10 mL OF URINE SUBMITTED     Comment: MICROSCOPIC EXAM PERFORMED ON UNCONCENTRATED URINE Performed at [facility],  [address], [place], [place] [address]   Pro Brain natriuretic peptide     Status: Abnormal   Collection Time: 01/12/24  1:41 PM  Result Value Ref Range   Pro Brain Natriuretic Peptide 528.0 (H) <300.0 pg/mL    Comment: (NOTE) Age Group        Cut-Points    Interpretation  < 50 years     450 pg/mL       NT-proBNP > 450 pg/mL  indicates                                ADHF is likely              50 to 75 years  900 pg/mL      NT-proBNP > 900 pg/mL indicates          ADHF is likely  > 75 years      1800 pg/mL     NT-proBNP > 1800 pg/mL indicates          ADHF is likely                           All ages    Results between       Indeterminate. Further clinical             300 and the cut-   information is needed to determine            point for age group   if ADHF is present.                                                             Elecsys proBNP II/ Elecsys proBNP II STAT           Cut-Point                       Interpretation  300 pg/mL                    NT-proBNP <300pg/mL indicates                             ADHF is not likely  Performed at [facility], [address] [place], [place] [address]   Gamma GT     Status: Abnormal   Collection Time: 01/12/24  1:41 PM  Result Value Ref Range   GGT 52 (H) 7 - 50 U/L    Comment: Performed at [facility], [address] [place], [place] [address]  Basic metabolic panel     Status: Abnormal   Collection Time: 01/12/24  1:41 PM  Result Value Ref Range   Sodium 129 (L) 135 - 145 mmol/L   Potassium 3.7 3.5 - 5.1 mmol/L   Chloride 95 (L) 98 - 111 mmol/L   CO2 14 (L) 22 - 32 mmol/L   Glucose, Bld 305 (H) 70 - 99 mg/dL    Comment: Glucose reference range applies only to samples taken after fasting for at least 8 hours.   BUN 51 (H) 6 - 20 mg/dL   Creatinine, Ser  8.81 (H) 0.61 - 1.24 mg/dL   Calcium 7.9 (L) 8.9 - 10.3 mg/dL   GFR, Estimated 6 (L) >60 mL/min    Comment: (NOTE) Calculated using the CKD-EPI Creatinine Equation (2021)    Anion gap 20 (H) 5 - 15    Comment: Performed at [facility], [address] [place], [place] [address]  Sedimentation rate     Status: Abnormal   Collection Time: 01/12/24  1:41 PM  Result Value Ref Range   Sed Rate 79 (H) 0 - 16 mm/hr    Comment: Performed at [facility],  [address] [place], [place] [address]  Magnesium     Status: None   Collection Time: 01/12/24  1:41 PM  Result Value Ref Range   Magnesium 2.0 1.7 - 2.4 mg/dL    Comment: Performed at [facility], [address] [place], [place] [address]  C-reactive protein     Status: Abnormal   Collection Time: 01/12/24  2:40 PM  Result Value Ref Range   CRP 31.7 (H) <1.0 mg/dL    Comment: Performed at [facility], [address] [place], [place] [address]  I-Stat arterial blood gas, ED [facility], [facility], [facility])     Status: Abnormal   Collection Time: 01/12/24  2:41 PM  Result Value Ref Range   pH, Arterial 7.305 (L) 7.35 - 7.45   pCO2 arterial 30.9 (L) 32 - 48 mmHg   pO2, Arterial 76 (L) 83 - 108 mmHg   Bicarbonate 15.1 (L) 20.0 - 28.0 mmol/L   TCO2 16 (L) 22 - 32 mmol/L   O2 Saturation 92 %   Acid-base deficit 10.0 (H) 0.0 - 2.0 mmol/L   Sodium 131 (L) 135 - 145 mmol/L   Potassium 3.8 3.5 - 5.1 mmol/L   Calcium, Ion 1.05 (L) 1.15 - 1.40 mmol/L   HCT 30.0 (L) 39.0 - 52.0 %   Hemoglobin 10.2 (L) 13.0 - 17.0 g/dL   Patient temperature 898.3 F    Collection site RADIAL, ALLEN'S TEST ACCEPTABLE    Drawn by Operator    Sample type ARTERIAL   Lactic acid, plasma     Status: None   Collection Time: 01/12/24  2:45 PM  Result Value Ref Range   Lactic Acid, Venous 1.6 0.5 - 1.9 mmol/L    Comment: Performed at [facility] [address] [place], [place] [address]  Glucose, capillary     Status: Abnormal   Collection Time: 01/12/24  7:55 PM  Result Value Ref Range   Glucose-Capillary 281 (H) 70 - 99 mg/dL    Comment: Glucose reference range applies only to samples taken after fasting for at least 8 hours.  CK     Status: Abnormal   Collection Time: 01/12/24  8:49 PM  Result Value Ref Range   Total CK 13,277 (H) 49 - 397 U/L    Comment: RESULT CONFIRMED BY MANUAL DILUTION Performed at [facility], [address] [place], [place] [address]   Lactate  dehydrogenase     Status: Abnormal   Collection Time: 01/12/24  8:49 PM  Result Value Ref Range   LDH 1,539 (H) 98 - 192 U/L    Comment: Performed at [facility], [address] [place], [place] [address]  Procalcitonin     Status: None   Collection Time: 01/12/24  8:49 PM  Result Value Ref Range   Procalcitonin  17.42 ng/mL    Comment:        Interpretation: PCT >= 10 ng/mL: Important systemic inflammatory response, almost exclusively due to severe bacterial sepsis or septic shock. (NOTE)       Sepsis PCT Algorithm           Lower Respiratory Tract                                      Infection PCT Algorithm    ----------------------------     ----------------------------         PCT < 0.25 ng/mL                PCT < 0.10 ng/mL          Strongly encourage             Strongly discourage   discontinuation of antibiotics    initiation of antibiotics    ----------------------------     -----------------------------       PCT 0.25 - 0.50 ng/mL            PCT 0.10 - 0.25 ng/mL               OR       >80% decrease in PCT            Discourage initiation of                                            antibiotics      Encourage discontinuation           of antibiotics    ----------------------------     -----------------------------         PCT >= 0.50 ng/mL              PCT 0.26 - 0.50 ng/mL                AND       <80% decrease in PCT             Encourage initiation of                                             antibiotics       Encourage continuation           of antibiotics    ----------------------------     -----------------------------        PCT >= 0.50 ng/mL                  PCT > 0.50 ng/mL               AND         increase in PCT                  Strongly encourage                                      initiation of antibiotics    Strongly encourage escalation           of antibiotics                                     -----------------------------  PCT <= 0.25 ng/mL                                                 OR                                        > 80% decrease in PCT                                      Discontinue / Do not initiate                                             antibiotics  Performed at [facility], [address] [place], [place] [address]   Hemoglobin A1c     Status: Abnormal   Collection Time: 01/12/24  8:49 PM  Result Value Ref Range   Hgb A1c MFr Bld 7.6 (H) 4.8 - 5.6 %    Comment: (NOTE) Diagnosis of Diabetes The following HbA1c ranges recommended by the American Diabetes Association (ADA) may be used as an aid in the diagnosis of diabetes mellitus.  Hemoglobin             Suggested A1C NGSP%              Diagnosis  <5.7                   Non Diabetic  5.7-6.4                Pre-Diabetic  >6.4                   Diabetic  <7.0                   Glycemic control for                       adults with diabetes.     Mean Plasma Glucose 171.42 mg/dL    Comment: Performed at [facility], [address] [place], [place] [address]  Lipase, blood     Status: Abnormal   Collection Time: 01/12/24  8:49 PM  Result Value Ref Range   Lipase 163 (H) 11 - 51 U/L    Comment: Performed at [facility], [address], [place], [place] [address]  HIV Antibody (routine testing w rflx)     Status: None   Collection Time: 01/12/24  8:49 PM  Result Value Ref Range   HIV Screen 4th Generation wRfx Non Reactive Non Reactive    Comment: Performed at [facility], [address] [place], [place] [address]  Strep pneumoniae urinary antigen     Status: None   Collection Time: 01/12/24  9:11 PM  Result Value Ref Range   Strep Pneumo Urinary Antigen NEGATIVE NEGATIVE    Comment:        Infection due to S. pneumoniae cannot be absolutely ruled out since the antigen present may be below the detection limit of the test. Performed at  [facility], [address] [place] [place], [place] [address]   Glucose, capillary     Status: Abnormal   Collection Time: 01/12/24 11:37 PM  Result Value Ref Range   Glucose-Capillary 332 (H) 70 - 99 mg/dL    Comment: Glucose reference range applies only to samples taken after fasting for at least 8 hours.  Glucose, capillary     Status: Abnormal   Collection Time: 01/13/24  3:52 AM  Result Value Ref Range   Glucose-Capillary 253 (H) 70 - 99 mg/dL    Comment: Glucose reference range applies only to samples taken after fasting for at least 8 hours.  Protime-INR     Status: Abnormal   Collection Time: 01/13/24  5:12 AM  Result Value Ref Range   Prothrombin Time 15.8 (H) 11.4 - 15.2 seconds   INR 1.2 0.8 - 1.2    Comment: (NOTE) INR goal varies based on device and disease states. Performed at [facility], [address] [place], [place] [address]   Cortisol-am, blood     Status: Abnormal   Collection Time: 01/13/24  5:12 AM  Result Value Ref Range   Cortisol - AM 22.9 (H) 6.7 - 22.6 ug/dL    Comment: Performed at [facility], [address] [place], [place] [address]  Comprehensive metabolic panel     Status: Abnormal   Collection Time: 01/13/24  5:12 AM  Result Value Ref Range   Sodium 133 (L) 135 - 145 mmol/L   Potassium 3.4 (L) 3.5 - 5.1 mmol/L   Chloride 100 98 - 111 mmol/L   CO2 17 (L) 22 - 32 mmol/L   Glucose, Bld 264 (H) 70 - 99 mg/dL    Comment: Glucose reference range applies only to samples taken after fasting for at least 8 hours.   BUN 64 (H) 6 - 20 mg/dL   Creatinine, Ser 89.85 (H) 0.61 - 1.24 mg/dL   Calcium 7.1 (L) 8.9 - 10.3 mg/dL   Total Protein 5.9 (L) 6.5 - 8.1 g/dL   Albumin 2.3 (L) 3.5 - 5.0 g/dL   AST 562 (H) 15 - 41 U/L   ALT 167 (H) 0 - 44 U/L   Alkaline Phosphatase 66 38 - 126 U/L   Total Bilirubin 1.4 (H) 0.0 - 1.2 mg/dL   GFR, Estimated 5 (L) >60 mL/min    Comment: (NOTE) Calculated using the CKD-EPI Creatinine Equation (2021)    Anion  gap 16 (H) 5 - 15    Comment: Performed at [facility], [address], [place], [place] [address]  CBC     Status: Abnormal   Collection Time: 01/13/24  5:12 AM  Result Value Ref Range   WBC 6.3 4.0 - 10.5 K/uL   RBC 3.58 (L) 4.22 - 5.81 MIL/uL   Hemoglobin 10.7 (L) 13.0 - 17.0 g/dL   HCT 70.5 (L) 60.9 - 47.9 %   MCV 82.1 80.0 - 100.0 fL   MCH 29.9 26.0 - 34.0 pg   MCHC 36.4 (H) 30.0 - 36.0 g/dL   RDW 86.2 88.4 - 84.4 %   Platelets 179 150 - 400 K/uL   nRBC 0.0 0.0 - 0.2 %    Comment: Performed at [facility], [address] [place], [place] [address]  Magnesium     Status: None   Collection Time: 01/13/24  5:12 AM  Result Value Ref Range   Magnesium 2.4 1.7 - 2.4 mg/dL  Comment: Performed at [facility], [address], [place], [place] [address]  APTT     Status: Abnormal   Collection Time: 01/13/24  5:12 AM  Result Value Ref Range   aPTT 40 (H) 24 - 36 seconds    Comment:        IF BASELINE aPTT IS ELEVATED, SUGGEST PATIENT RISK ASSESSMENT BE USED TO DETERMINE APPROPRIATE ANTICOAGULANT THERAPY. Performed at [facility], [address] [place], [place] [address]   Ammonia     Status: Abnormal   Collection Time: 01/13/24  5:12 AM  Result Value Ref Range   Ammonia 46 (H) 9 - 35 umol/L    Comment: Performed at [facility], [address] [place], [place] [address]  CK     Status: Abnormal   Collection Time: 01/13/24  5:12 AM  Result Value Ref Range   Total CK 10,078 (H) 49 - 397 U/L    Comment: RESULT CONFIRMED BY MANUAL DILUTION Performed at [facility], [address], [place], [place] [address]   Hepatitis panel, acute     Status: None   Collection Time: 01/13/24  5:12 AM  Result Value Ref Range   Hepatitis B Surface Ag NON REACTIVE NON REACTIVE   HCV Ab NON REACTIVE NON REACTIVE    Comment: (NOTE) Nonreactive HCV antibody screen is consistent with no HCV infections,  unless recent infection is suspected or other  evidence exists to indicate HCV infection.     Hep A IgM NON REACTIVE NON REACTIVE   Hep B C IgM NON REACTIVE NON REACTIVE    Comment: Performed at [facility], [address], [place], [place] [address]  Renal function panel (daily at 0500)     Status: Abnormal   Collection Time: 01/13/24  5:14 AM  Result Value Ref Range   Sodium 133 (L) 135 - 145 mmol/L   Potassium 3.4 (L) 3.5 - 5.1 mmol/L   Chloride 100 98 - 111 mmol/L   CO2 17 (L) 22 - 32 mmol/L   Glucose, Bld 257 (H) 70 - 99 mg/dL    Comment: Glucose reference range applies only to samples taken after fasting for at least 8 hours.   BUN 63 (H) 6 - 20 mg/dL   Creatinine, Ser 89.82 (H) 0.61 - 1.24 mg/dL   Calcium 7.1 (L) 8.9 - 10.3 mg/dL   Phosphorus 5.6 (H) 2.5 - 4.6 mg/dL   Albumin 2.3 (L) 3.5 - 5.0 g/dL   GFR, Estimated 5 (L) >60 mL/min    Comment: (NOTE) Calculated using the CKD-EPI Creatinine Equation (2021)    Anion gap 16 (H) 5 - 15    Comment: Performed at [facility], [address], [place], [place] [address]  Rapid urine drug screen (hospital performed)     Status: None   Collection Time: 01/13/24  6:00 AM  Result Value Ref Range   Opiates NONE DETECTED NONE DETECTED   Cocaine NONE DETECTED NONE DETECTED   Benzodiazepines NONE DETECTED NONE DETECTED   Amphetamines NONE DETECTED NONE DETECTED   Tetrahydrocannabinol NONE DETECTED NONE DETECTED   Barbiturates NONE DETECTED NONE DETECTED    Comment: (NOTE) DRUG SCREEN FOR MEDICAL PURPOSES ONLY.  IF CONFIRMATION IS NEEDED FOR ANY PURPOSE, NOTIFY LAB WITHIN 5 DAYS.  LOWEST DETECTABLE LIMITS FOR URINE DRUG SCREEN Drug Class  Cutoff (ng/mL) Amphetamine and metabolites    1000 Barbiturate and metabolites    200 Benzodiazepine                 200 Opiates and metabolites        300 Cocaine and metabolites        300 THC                            50 Performed at [facility], [address] [place], [place] [address]    Glucose, capillary     Status: Abnormal   Collection Time: 01/13/24  7:53 AM  Result Value Ref Range   Glucose-Capillary 180 (H) 70 - 99 mg/dL    Comment: Glucose reference range applies only to samples taken after fasting for at least 8 hours.  Glucose, capillary     Status: Abnormal   Collection Time: 01/13/24 11:28 AM  Result Value Ref Range   Glucose-Capillary 185 (H) 70 - 99 mg/dL    Comment: Glucose reference range applies only to samples taken after fasting for at least 8 hours.  Glucose, capillary     Status: Abnormal   Collection Time: 01/13/24  3:52 PM  Result Value Ref Range   Glucose-Capillary 162 (H) 70 - 99 mg/dL    Comment: Glucose reference range applies only to samples taken after fasting for at least 8 hours.   DG Chest Port 1 View Result Date: 01/13/2024 CLINICAL DATA:  Tunneled central venous catheter placement EXAM: PORTABLE CHEST 1 VIEW COMPARISON:  01/12/2024 FINDINGS: Left internal jugular temporary hemodialysis catheter tip seen overlying the expected brachiocephalic vein. No pneumothorax. Progressive consolidation within the left lung. Small left pleural effusion suspected. Right lung is clear. No pleural effusion on the right. Cardiac size within normal limits. No acute bone abnormality. IMPRESSION: 1. Left internal jugular temporary hemodialysis catheter tip within the brachiocephalic vein. No pneumothorax. 2. Progressive consolidation within the left lung. Associated small left pleural effusion. Electronically Signed   By: Dorethia Molt M.D.   On: 01/13/2024 03:39   CT HEAD WO CONTRAST ( ) Result Date: 01/13/2024 CLINICAL DATA:  Seizure, new-onset, no history of trauma EXAM: CT HEAD WITHOUT CONTRAST TECHNIQUE: Contiguous axial images were obtained from the base of the skull through the vertex without intravenous contrast. RADIATION DOSE REDUCTION: This exam was performed according to the departmental dose-optimization program which includes automated exposure  control, adjustment of the mA and/or kV according to patient size and/or use of iterative reconstruction technique. COMPARISON:  None Available. FINDINGS: Brain: No evidence of acute infarction, hemorrhage, hydrocephalus, extra-axial collection or mass lesion/mass effect. Vascular: No hyperdense vessel or unexpected calcification. Skull: Normal. Negative for fracture or focal lesion. Sinuses/Orbits: No acute finding. IMPRESSION: No evidence of acute abnormality. Electronically Signed   By: Gilmore GORMAN Molt M.D.   On: 01/13/2024 01:04   CT CHEST ABDOMEN PELVIS WO CONTRAST Result Date: 01/12/2024 CLINICAL DATA:  Sepsis EXAM: CT CHEST, ABDOMEN AND PELVIS WITHOUT CONTRAST TECHNIQUE: Multidetector CT imaging of the chest, abdomen and pelvis was performed following the standard protocol without IV contrast. RADIATION DOSE REDUCTION: This exam was performed according to the departmental dose-optimization program which includes automated exposure control, adjustment of the mA and/or kV according to patient size and/or use of iterative reconstruction technique. COMPARISON:  10/25/2023 FINDINGS: CT CHEST FINDINGS Cardiovascular: Mild cardiomegaly. Minimal atheromatous vascular calcification of the thoracic aorta. Mediastinum/Nodes: Likely reactive prevascular node 1.0 cm in short  axis on image 20 series 301. Lungs/Pleura: New consolidation in a substantial portion of the left upper lobe and in the superior segment left lower lobe favoring multilobar pneumonia. Associated air bronchograms without mass effect on the associated airways. Small left pleural effusion. Musculoskeletal: Subacute healing left anterior eighth rib fracture on image 99 series 302, not present/perceptible on 10/25/2023. Thoracic spondylosis. Mild bilateral gynecomastia. CT ABDOMEN PELVIS FINDINGS Hepatobiliary: Diffuse hepatic steatosis. No focal hepatic lesion identified on today's noncontrast exam. Pancreas: Unremarkable Spleen: Unremarkable  Adrenals/Urinary Tract: Unremarkable Stomach/Bowel: Distal colonic and rectal air-levels favoring diarrheal process. No dilated bowel. Appendix poorly seen today. Vascular/Lymphatic: Unremarkable Reproductive: Unremarkable Other: No supplemental non-categorized findings. Musculoskeletal: Stable localized sclerosis along the iliac side of the left SI joint is probably related to arthropathy although technically nonspecific. No change from 10/25/2023. Mild lower lumbar spondylosis and degenerative disc disease. IMPRESSION: 1. New consolidation in a substantial portion of the left upper lobe and in the superior segment left lower lobe favoring multilobar pneumonia. Associated air bronchograms without mass effect on the associated airways. Small left pleural effusion. 2. Subacute healing left anterior eighth rib fracture, not present/perceptible on 10/25/2023. 3. Diffuse hepatic steatosis. 4. Distal colonic and rectal air-levels favoring diarrheal process. 5. Mild cardiomegaly. 6. Stable localized sclerosis along the iliac side of the left SI joint is probably related to arthropathy although technically nonspecific. No change from 10/25/2023. 7. Mild lower lumbar spondylosis and degenerative disc disease. Electronically Signed   By: Ryan Salvage M.D.   On: 01/12/2024 12:16   DG Chest Portable 1 View Result Date: 01/12/2024 CLINICAL DATA:  Cough. EXAM: PORTABLE CHEST 1 VIEW COMPARISON:  10/25/2023 FINDINGS: The heart size and mediastinal contours are within normal limits. Patient is partially rotated to the left. Asymmetric opacity in the left hemithorax is probably due to overlying soft tissue attenuation, with left lung infiltrate considered less likely. No pleural effusion. IMPRESSION: Asymmetric opacity in left hemithorax, probably due to overlying soft tissue attenuation, with left lung opacity considered less likely. Recommend routine PA and lateral chest radiographs for further evaluation. Electronically Signed    By: Norleen DELENA Kil M.D.   On: 01/12/2024 10:51    PMH:   Past Medical History:  Diagnosis Date   Diabetes mellitus without complication (HCC)    Hyperlipidemia    Hypertension     PSH:  History reviewed. No pertinent surgical history.  Allergies: No Known Allergies  Medications:   Prior to Admission medications   Medication Sig Start Date End Date Taking? Authorizing Provider  amLODipine  (NORVASC ) 5 MG tablet Take 1 tablet (5 mg total) by mouth daily. 08/29/23  Yes Thapa, [place], MD  atorvastatin (LIPITOR) 80 MG tablet Take 80 mg by mouth daily.   Yes [provider]  doxazosin  (CARDURA ) 2 MG tablet Take 2 mg by mouth daily.   Yes [provider]  eplerenone  (INSPRA ) 50 MG tablet Take 1 tablet (50 mg total) by mouth daily. 08/28/23  Yes Thapa, [place], MD  metFORMIN (GLUCOPHAGE) 500 MG tablet Take 500 mg by mouth daily with breakfast.   Yes [provider]  azithromycin  (ZITHROMAX ) 250 MG tablet Take 1 tablet (250 mg total) by mouth daily. 07/15/14   Terryl Kubas, NP  chlorpheniramine-HYDROcodone [facility] PENNKINETIC ER) 10-8 MG/5ML LQCR Take 5 mLs by mouth 2 (two) times daily. 07/15/14   Terryl Kubas, NP  doxazosin  (CARDURA ) 1 MG tablet Take 2 tablets (2 mg total) by mouth daily. Patient not taking: Reported on 01/12/2024 08/28/23  Thapa, [place], MD    Discontinued Meds:   Medications Discontinued During This Encounter  Medication Reason   acetaminophen  (TYLENOL ) tablet 650 mg    atorvastatin (LIPITOR) 40 MG tablet Discontinued by provider   ezetimibe (ZETIA) 10 MG tablet Discontinued by provider   doxazosin  (CARDURA ) 2 MG tablet Discontinued by provider   spironolactone  (ALDACTONE ) 100 MG tablet Discontinued by provider   verapamil  (CALAN -SR) 180 MG CR tablet Discontinued by provider   metFORMIN (GLUCOPHAGE) 500 MG tablet Discontinued by provider   lactated ringers  infusion    metroNIDAZOLE  (FLAGYL ) IVPB 500 mg    ceFEPIme (MAXIPIME) 2 g in sodium  chloride 0.9 % 100 mL IVPB    famotidine  (PEPCID ) IVPB 20 mg premix    azithromycin  (ZITHROMAX ) 500 mg in sodium chloride  0.9 % 250 mL IVPB    metroNIDAZOLE  (FLAGYL ) IVPB 500 mg    LORazepam  (ATIVAN ) injection 2 mg    famotidine  (PEPCID ) tablet 20 mg    famotidine  (PEPCID ) tablet 20 mg     Social History:  reports that he has never smoked. He has never used smokeless tobacco. He reports that he does not drink alcohol. No history on file for drug use.  Family History:  History reviewed. No pertinent family history.  Blood pressure (!) 144/90, pulse 93, temperature 98.2 F (36.8 C), temperature source Oral, resp. rate (!) 25, height 6' (1.829 m), weight 109.4 kg, SpO2 95%. Physical Exam: Gen obtunded, on FM O2, post-ictal No rash, cyanosis or gangrene Sclera anicteric, throat clear  No jvd or bruits Chest clear bilat to bases RRR no MRG Abd soft ntnd no mass or ascites +bs GU foley cath in place MS no joint effusions or deformity Ext no LE or UE edema, no other edema Neuro as above Access LIJ temp     MELIA LYNWOOD ORN, MD 01/13/2024, 4:00 PM

## 2024-01-13 NOTE — Progress Notes (Signed)
 At this time patient removing ice packs, with a current temp of 101.6.   RN educated patient on the importance of temperature management.   Patient acknowledges this, wife at bedside.   Per wife, hold off cooling blanket for now.

## 2024-01-14 ENCOUNTER — Inpatient Hospital Stay (HOSPITAL_COMMUNITY)

## 2024-01-14 DIAGNOSIS — R748 Abnormal levels of other serum enzymes: Secondary | ICD-10-CM | POA: Diagnosis not present

## 2024-01-14 DIAGNOSIS — R569 Unspecified convulsions: Secondary | ICD-10-CM | POA: Diagnosis not present

## 2024-01-14 DIAGNOSIS — A419 Sepsis, unspecified organism: Secondary | ICD-10-CM | POA: Diagnosis not present

## 2024-01-14 DIAGNOSIS — N179 Acute kidney failure, unspecified: Secondary | ICD-10-CM | POA: Diagnosis not present

## 2024-01-14 DIAGNOSIS — K7200 Acute and subacute hepatic failure without coma: Secondary | ICD-10-CM | POA: Diagnosis not present

## 2024-01-14 DIAGNOSIS — E119 Type 2 diabetes mellitus without complications: Secondary | ICD-10-CM | POA: Diagnosis not present

## 2024-01-14 DIAGNOSIS — M6282 Rhabdomyolysis: Secondary | ICD-10-CM | POA: Diagnosis not present

## 2024-01-14 LAB — URINE CULTURE: Culture: NO GROWTH

## 2024-01-14 LAB — GASTROINTESTINAL PANEL BY PCR, STOOL (REPLACES STOOL CULTURE)

## 2024-01-14 LAB — GLUCOSE, CAPILLARY
Glucose-Capillary: 167 mg/dL — ABNORMAL HIGH (ref 70–99)
Glucose-Capillary: 174 mg/dL — ABNORMAL HIGH (ref 70–99)
Glucose-Capillary: 139 mg/dL — ABNORMAL HIGH (ref 70–99)
Glucose-Capillary: 220 mg/dL — ABNORMAL HIGH (ref 70–99)
Glucose-Capillary: 212 mg/dL — ABNORMAL HIGH (ref 70–99)

## 2024-01-14 LAB — RESPIRATORY PANEL BY PCR

## 2024-01-14 LAB — RENAL FUNCTION PANEL
Albumin: 2.3 g/dL — ABNORMAL LOW (ref 3.5–5.0)
Albumin: 2.4 g/dL — ABNORMAL LOW (ref 3.5–5.0)
Anion gap: 13 (ref 5–15)
Anion gap: 12 (ref 5–15)
BUN: 35 mg/dL — ABNORMAL HIGH (ref 6–20)
BUN: 31 mg/dL — ABNORMAL HIGH (ref 6–20)
CO2: 26 mmol/L (ref 22–32)
CO2: 24 mmol/L (ref 22–32)
Calcium: 7.3 mg/dL — ABNORMAL LOW (ref 8.9–10.3)
Calcium: 7.5 mg/dL — ABNORMAL LOW (ref 8.9–10.3)
Chloride: 93 mmol/L — ABNORMAL LOW (ref 98–111)
Chloride: 97 mmol/L — ABNORMAL LOW (ref 98–111)
Creatinine, Ser: 5.46 mg/dL — ABNORMAL HIGH (ref 0.61–1.24)
Creatinine, Ser: 4.99 mg/dL — ABNORMAL HIGH (ref 0.61–1.24)
GFR, Estimated: 11 mL/min — ABNORMAL LOW (ref >60)
GFR, Estimated: 13 mL/min — ABNORMAL LOW (ref >60)
Glucose, Bld: 147 mg/dL — ABNORMAL HIGH (ref 70–99)
Glucose, Bld: 238 mg/dL — ABNORMAL HIGH (ref 70–99)
Phosphorus: 2.7 mg/dL (ref 2.5–4.6)
Phosphorus: 3 mg/dL (ref 2.5–4.6)
Potassium: 3.1 mmol/L — ABNORMAL LOW (ref 3.5–5.1)
Potassium: 3.6 mmol/L (ref 3.5–5.1)
Sodium: 132 mmol/L — ABNORMAL LOW (ref 135–145)
Sodium: 133 mmol/L — ABNORMAL LOW (ref 135–145)

## 2024-01-14 LAB — CBC
HCT: 31.7 % — ABNORMAL LOW (ref 39.0–52.0)
Hemoglobin: 11.3 g/dL — ABNORMAL LOW (ref 13.0–17.0)
MCH: 29.7 pg (ref 26.0–34.0)
MCHC: 35.6 g/dL (ref 30.0–36.0)
MCV: 83.4 fL (ref 80.0–100.0)
Platelets: 201 K/uL (ref 150–400)
RBC: 3.8 MIL/uL — ABNORMAL LOW (ref 4.22–5.81)
RDW: 13.9 % (ref 11.5–15.5)
WBC: 7.2 K/uL (ref 4.0–10.5)
nRBC: 0 % (ref 0.0–0.2)

## 2024-01-14 LAB — ANA W/REFLEX IF POSITIVE: Anti Nuclear Antibody (ANA): NEGATIVE

## 2024-01-14 LAB — HEPATIC FUNCTION PANEL
ALT: 136 U/L — ABNORMAL HIGH (ref 0–44)
AST: 269 U/L — ABNORMAL HIGH (ref 15–41)
Albumin: 2.4 g/dL — ABNORMAL LOW (ref 3.5–5.0)
Alkaline Phosphatase: 61 U/L (ref 38–126)
Bilirubin, Direct: 0.6 mg/dL — ABNORMAL HIGH (ref 0.0–0.2)
Indirect Bilirubin: 1 mg/dL — ABNORMAL HIGH (ref 0.3–0.9)
Total Bilirubin: 1.6 mg/dL — ABNORMAL HIGH (ref 0.0–1.2)
Total Protein: 6.2 g/dL — ABNORMAL LOW (ref 6.5–8.1)

## 2024-01-14 LAB — CK: Total CK: 3761 U/L — ABNORMAL HIGH (ref 49–397)

## 2024-01-14 LAB — COPPER, SERUM: Copper: 109 ug/dL (ref 69–132)

## 2024-01-14 LAB — APTT: aPTT: 52 s — ABNORMAL HIGH (ref 24–36)

## 2024-01-14 LAB — MAGNESIUM: Magnesium: 2.4 mg/dL (ref 1.7–2.4)

## 2024-01-14 MED ORDER — HEPARIN SODIUM (PORCINE) 5000 UNIT/ML IJ SOLN
5000.0000 [IU] | Freq: Three times a day (TID) | INTRAMUSCULAR | Status: DC
  Administered 2024-01-14 – 2024-01-20 (×18): 5000 [IU] via SUBCUTANEOUS
  Filled 2024-01-14 (×18): qty 1

## 2024-01-14 MED ORDER — DOXAZOSIN MESYLATE 2 MG PO TABS
2.0000 mg | ORAL_TABLET | Freq: Every day | ORAL | Status: DC
  Administered 2024-01-14 – 2024-01-20 (×7): 2 mg via ORAL
  Filled 2024-01-14 (×7): qty 1

## 2024-01-14 MED ORDER — AMLODIPINE BESYLATE 5 MG PO TABS
5.0000 mg | ORAL_TABLET | Freq: Once | ORAL | Status: AC
  Administered 2024-01-14: 5 mg via ORAL
  Filled 2024-01-14: qty 1

## 2024-01-14 MED ORDER — AMLODIPINE BESYLATE 10 MG PO TABS
10.0000 mg | ORAL_TABLET | Freq: Every day | ORAL | Status: DC
  Administered 2024-01-15 – 2024-01-20 (×6): 10 mg via ORAL
  Filled 2024-01-14 (×6): qty 1

## 2024-01-14 MED ORDER — HYDRALAZINE HCL 20 MG/ML IJ SOLN
20.0000 mg | INTRAMUSCULAR | Status: DC | PRN
  Filled 2024-01-14: qty 1

## 2024-01-14 MED ORDER — AMLODIPINE BESYLATE 5 MG PO TABS
5.0000 mg | ORAL_TABLET | Freq: Every day | ORAL | Status: DC
Start: 2024-01-14 — End: 2024-01-14
  Administered 2024-01-14: 5 mg via ORAL
  Filled 2024-01-14: qty 1

## 2024-01-14 MED ORDER — ACETAMINOPHEN 325 MG PO TABS
650.0000 mg | ORAL_TABLET | Freq: Once | ORAL | Status: AC
  Administered 2024-01-14: 650 mg via ORAL
  Filled 2024-01-14: qty 2

## 2024-01-14 MED ORDER — GUAIFENESIN 100 MG/5ML PO LIQD
5.0000 mL | ORAL | Status: DC | PRN
  Administered 2024-01-14: 5 mL via ORAL
  Filled 2024-01-14: qty 10

## 2024-01-14 NOTE — Progress Notes (Signed)
 Patient seen, evaluated examined, care plan was updated and discussed with Dr.Kirstie Everhart as per documentation  Patient with acute kidney injury, on CRRT, rhabdomyolysis - He did have complaints of fatigue diarrhea nausea vomiting for few days prior to presentation  Had a tonic-clonic seizure for which he received Ativan  on the way to the hospital  Hemodynamics, mental status have been stable  He is awake alert interactive Stable hemodynamics Clear breath sounds Bowel sounds appreciated S1-S2 appreciated Extremities shows no clubbing, no edema  I reviewed last 24 h vitals and pain scores, last 48 h intake and output, last 24 h labs and trends, and last 24 h imaging results.  Acute kidney injury Rhabdomyolysis -Etiology of his acute kidney injury is unclear - Continue fluid resuscitation - Continue CRRT - Appreciate renal service involvement - Parameters improving  Acute liver injury - Appreciate GI involvement - Continue to trend transaminases  Seizure was likely related to electrolyte derangement - Continue to monitor -Seizure precautions  Multilobar pneumonia - Continue antibiotic coverage  Type 2 diabetes - Continue SSI   Suspicions of obstructive sleep apnea - Will need sleep study as outpatient further evaluation and possibly a sleep study as outpatient  The patient is critically ill with multiple organ systems failure and requires high complexity decision making for assessment and support, frequent evaluation and titration of therapies, application of advanced monitoring technologies and extensive interpretation of multiple databases. Critical Care Time devoted to patient care services described in this note independent of APP/resident time (if applicable)  is 35 minutes.   Jennet Epley MD  Pulmonary Critical Care Personal pager: See Amion If unanswered, please page CCM On-call: #[phone]

## 2024-01-14 NOTE — Plan of Care (Signed)
  Problem: Education: Goal: Knowledge of General Education information will improve Description: Including pain rating scale, medication(s)/side effects and non-pharmacologic comfort measures Outcome: Progressing   Problem: Health Behavior/Discharge Planning: Goal: Ability to manage health-related needs will improve Outcome: Progressing   Problem: Clinical Measurements: Goal: Ability to maintain clinical measurements within normal limits will improve Outcome: Progressing Goal: Will remain free from infection Outcome: Progressing Goal: Diagnostic test results will improve Outcome: Progressing Goal: Respiratory complications will improve Outcome: Progressing Goal: Cardiovascular complication will be avoided Outcome: Progressing   Problem: Activity: Goal: Risk for activity intolerance will decrease Outcome: Progressing   Problem: Nutrition: Goal: Adequate nutrition will be maintained Outcome: Progressing   Problem: Coping: Goal: Level of anxiety will decrease Outcome: Progressing   Problem: Elimination: Goal: Will not experience complications related to bowel motility Outcome: Progressing Goal: Will not experience complications related to urinary retention Outcome: Progressing   Problem: Pain Managment: Goal: General experience of comfort will improve and/or be controlled Outcome: Progressing   Problem: Safety: Goal: Ability to remain free from injury will improve Outcome: Progressing   Problem: Skin Integrity: Goal: Risk for impaired skin integrity will decrease Outcome: Progressing   Problem: Education: Goal: Ability to describe self-care measures that may prevent or decrease complications (Diabetes Survival Skills Education) will improve Outcome: Progressing Goal: Individualized Educational Video(s) Outcome: Progressing   Problem: Coping: Goal: Ability to adjust to condition or change in health will improve Outcome: Progressing   Problem: Fluid  Volume: Goal: Ability to maintain a balanced intake and output will improve Outcome: Progressing   Problem: Health Behavior/Discharge Planning: Goal: Ability to identify and utilize available resources and services will improve Outcome: Progressing   Problem: Metabolic: Goal: Ability to maintain appropriate glucose levels will improve Outcome: Progressing   Problem: Skin Integrity: Goal: Risk for impaired skin integrity will decrease Outcome: Progressing   Problem: Tissue Perfusion: Goal: Adequacy of tissue perfusion will improve Outcome: Progressing   Problem: Fluid Volume: Goal: Hemodynamic stability will improve Outcome: Progressing   Problem: Clinical Measurements: Goal: Diagnostic test results will improve Outcome: Progressing Goal: Signs and symptoms of infection will decrease Outcome: Progressing   Problem: Respiratory: Goal: Ability to maintain adequate ventilation will improve Outcome: Progressing   Problem: Activity: Goal: Ability to tolerate increased activity will improve Outcome: Progressing   Problem: Clinical Measurements: Goal: Ability to maintain a body temperature in the normal range will improve Outcome: Progressing   Problem: Respiratory: Goal: Ability to maintain adequate ventilation will improve Outcome: Progressing Goal: Ability to maintain a clear airway will improve Outcome: Progressing

## 2024-01-14 NOTE — Progress Notes (Signed)
 Adam Beasley is an 58 y.o. male w/ PMH as below who presented early 7/27 morning c/o cough, diarrhea and N/V for the last 4 days. Also SOB and fatigue. In the ED temp 103, RR 20, HR 121, BP 160/90. Brady 2 L at 93%.  Labs showed Na 130, K 3.6, CO2 14, bun 51, creat 8.81 (0.9 in may 2025) with hosp course complicated by seizures, CT showed new consolidation in LUL and LLL.  Assessment/Plan: AKI, severe: w/ normal b/l creat 0.9- 1.0 from jan and may 2025, eGFR 96 ml/min. Creat here is 8.6 in the setting of an acute resp illness x 1 week, w/ multifocal PNA most likely per CT. No acei/ ARB at home, BP's stable here. No nephrotoxins. On exam looks euvolemic, possibly a bit dry. CT showed bilat kidneys w/o obstruction. UA showed ^protein but no rbcs/ wbcs. Patient just had a significant seizure episode, this could possibly due to uremia. Not sure cause of AKI, vol depletion probably played a role initially but likely ATN from rhabdo (CPK 10k). Due to the seizures, which could be uremia-related, pt started on CRRT 7/27.  - Not making much urine; should be able to stop the NS and eventually HCO3 based therapy once metabolic acidosis resolves. - Seen on CRRT 4K baths UF 0-39ml/hr -> if filter clots we can challenge with intermittent.   Can stop the HCO3 gtt; he's not making much urine at all, borderline anuric.  -Monitor Daily I/Os, Daily weight  -Maintain MAP>65 for optimal renal perfusion.  - Avoid nephrotoxic agents such as IV contrast, NSAIDs, and phosphate containing bowel preps (FLEETS)  Multifocal PNA: per CT, on IV abx per pmd Acute seizures: tonight, RR rx'd w/ IV ativan .   Subjective: Spouse bedside; poor appetite consuming about 25% of meal, hypertensive, very poor UOP with foley to gravity.   Chemistry and CBC: Creat  Date/Time Value Ref Range Status  07/16/2023 03:15 PM 0.93 0.70 - 1.30 mg/dL Final  87/97/7975 96:47 PM 0.95 0.70 - 1.30 mg/dL Final   Creatinine, Ser  Date/Time Value Ref  Range Status  01/13/2024 04:08 PM 6.98 (H) 0.61 - 1.24 mg/dL Final  92/71/7974 94:85 AM 10.17 (H) 0.61 - 1.24 mg/dL Final  92/71/7974 94:87 AM 10.14 (H) 0.61 - 1.24 mg/dL Final  92/72/7974 98:58 PM 8.81 (H) 0.61 - 1.24 mg/dL Final  92/72/7974 90:48 AM 8.69 (H) 0.61 - 1.24 mg/dL Final  94/90/7974 97:80 PM 1.06 0.61 - 1.24 mg/dL Final  98/72/7983 88:59 PM 1.04 0.50 - 1.35 mg/dL Final   Recent Labs  Lab 01/12/24 0951 01/12/24 1200 01/12/24 1341 01/12/24 1441 01/13/24 0512 01/13/24 0514 01/13/24 1608  NA 127* 130* 129* 131* 133* 133* 135  K 3.6 3.6 3.7 3.8 3.4* 3.4* 3.2*  CL 91*  --  95*  --  100 100 101  CO2 17*  --  14*  --  17* 17* 22  GLUCOSE 328*  --  305*  --  264* 257* 155*  BUN 51*  --  51*  --  64* 63* 45*  CREATININE 8.69*  --  8.81*  --  10.14* 10.17* 6.98*  CALCIUM 8.6*  --  7.9*  --  7.1* 7.1* 7.4*  PHOS  --   --   --   --   --  5.6* 2.9   Recent Labs  Lab 01/12/24 1119 01/12/24 1200 01/12/24 1441 01/13/24 0512  WBC 7.5  --   --  6.3  NEUTROABS 5.8  --   --   --  HGB 11.7* 11.2* 10.2* 10.7*  HCT 32.5* 33.0* 30.0* 29.4*  MCV 81.7  --   --  82.1  PLT 177  --   --  179   Liver Function Tests: Recent Labs  Lab 01/12/24 0951 01/12/24 1119 01/13/24 0512 01/13/24 0514 01/13/24 1608  AST 834* 830* 437*  --   --   ALT 263* 264* 167*  --   --   ALKPHOS 78 77 66  --   --   BILITOT 2.2* 2.1* 1.4*  --   --   PROT 7.5 7.5 5.9*  --   --   ALBUMIN 3.6 3.6 2.3* 2.3* 2.5*   Recent Labs  Lab 01/12/24 2049  LIPASE 163*   Recent Labs  Lab 01/13/24 0512  AMMONIA 46*   Cardiac Enzymes: Recent Labs  Lab 01/12/24 2049 01/13/24 0512  CKTOTAL 13,277* 10,078*   Iron Studies: No results for input(s): IRON, TIBC, TRANSFERRIN, FERRITIN in the last 72 hours. PT/INR: @LABRCNTIP (inr:5)  Xrays/Other Studies: ) Results for orders placed or performed during the hospital encounter of 01/12/24 (from the past 48 hours)  Comprehensive metabolic panel      Status: Abnormal   Collection Time: 01/12/24  9:51 AM  Result Value Ref Range   Sodium 127 (L) 135 - 145 mmol/L   Potassium 3.6 3.5 - 5.1 mmol/L   Chloride 91 (L) 98 - 111 mmol/L   CO2 17 (L) 22 - 32 mmol/L   Glucose, Bld 328 (H) 70 - 99 mg/dL    Comment: Glucose reference range applies only to samples taken after fasting for at least 8 hours.   BUN 51 (H) 6 - 20 mg/dL   Creatinine, Ser 1.30 (H) 0.61 - 1.24 mg/dL   Calcium 8.6 (L) 8.9 - 10.3 mg/dL   Total Protein 7.5 6.5 - 8.1 g/dL   Albumin 3.6 3.5 - 5.0 g/dL   AST 165 (H) 15 - 41 U/L   ALT 263 (H) 0 - 44 U/L   Alkaline Phosphatase 78 38 - 126 U/L   Total Bilirubin 2.2 (H) 0.0 - 1.2 mg/dL   GFR, Estimated 7 (L) >60 mL/min    Comment: (NOTE) Calculated using the CKD-EPI Creatinine Equation (2021)    Anion gap 18 (H) 5 - 15    Comment: Performed at [facility], [address] [place], [place] [address]  Beta-hydroxybutyric acid     Status: None   Collection Time: 01/12/24  9:51 AM  Result Value Ref Range   Beta-Hydroxybutyric Acid 0.26 0.05 - 0.27 mmol/L    Comment: Performed at [facility], [address] [place], [place] [address]  Lactic acid, plasma     Status: None   Collection Time: 01/12/24  9:59 AM  Result Value Ref Range   Lactic Acid, Venous 1.6 0.5 - 1.9 mmol/L    Comment: Performed at [facility], [address] [place], [place] [address]  Hepatitis panel, acute     Status: None   Collection Time: 01/12/24 10:00 AM  Result Value Ref Range   Hepatitis B Surface Ag NON REACTIVE NON REACTIVE   HCV Ab NON REACTIVE NON REACTIVE    Comment: (NOTE) Nonreactive HCV antibody screen is consistent with no HCV infections,  unless recent infection is suspected or other evidence exists to indicate HCV infection.     Hep A IgM NON REACTIVE NON REACTIVE   Hep B C IgM NON REACTIVE NON REACTIVE  Comment: Performed at [facility], [address] [place], [place] [address]  Blood  Culture (routine x 2)     Status: None (Preliminary result)   Collection Time: 01/12/24 10:01 AM   Specimen: BLOOD  Result Value Ref Range   Specimen Description      BLOOD RIGHT ANTECUBITAL Performed at [facility], [address] [place], [place] [address]    Special Requests      BOTTLES DRAWN AEROBIC AND ANAEROBIC Blood Culture adequate volume Performed at [facility], [address] [place], [place] [address]    Culture      NO GROWTH 2 DAYS Performed at [facility], [address] [place], [place] [address]    Report Status PENDING   Blood Culture (routine x 2)     Status: None (Preliminary result)   Collection Time: 01/12/24 10:12 AM   Specimen: BLOOD  Result Value Ref Range   Specimen Description      BLOOD LEFT ANTECUBITAL Performed at [facility], [address] [place], [place] [address]    Special Requests      BOTTLES DRAWN AEROBIC AND ANAEROBIC Blood Culture adequate volume Performed at [facility], [address] [place], [place] [address]    Culture      NO GROWTH 2 DAYS Performed at [facility], [address] [place], [place] [address]    Report Status PENDING   CBC with Differential     Status: Abnormal   Collection Time: 01/12/24 11:19 AM  Result Value Ref Range   WBC 7.5 4.0 - 10.5 K/uL   RBC 3.98 (L) 4.22 - 5.81 MIL/uL   Hemoglobin 11.7 (L) 13.0 - 17.0 g/dL   HCT 67.4 (L) 60.9 - 47.9 %   MCV 81.7 80.0 - 100.0 fL   MCH 29.4 26.0 - 34.0 pg   MCHC 36.0 30.0 - 36.0 g/dL   RDW 86.5 88.4 - 84.4 %   Platelets 177 150 - 400 K/uL   nRBC 0.0 0.0 - 0.2 %   Neutrophils Relative % 77 %   Neutro Abs 5.8 1.7 - 7.7 K/uL   Lymphocytes Relative 11 %   Lymphs Abs 0.8 0.7 - 4.0 K/uL   Monocytes Relative 10 %   Monocytes Absolute 0.7 0.1 - 1.0 K/uL   Eosinophils Relative 0 %   Eosinophils Absolute 0.0 0.0 - 0.5 K/uL   Basophils Relative 0 %   Basophils Absolute 0.0 0.0 - 0.1 K/uL   Immature  Granulocytes 2 %   Abs Immature Granulocytes 0.12 (H) 0.00 - 0.07 K/uL    Comment: Performed at [facility], [address] [place], [place] [address]  Protime-INR     Status: None   Collection Time: 01/12/24 11:19 AM  Result Value Ref Range   Prothrombin Time 14.8 11.4 - 15.2 seconds   INR 1.1 0.8 - 1.2    Comment: (NOTE) INR goal varies based on device and disease states. Performed at [facility], [address] [place], [place] [address]   Acetaminophen  level     Status: Abnormal   Collection Time: 01/12/24 11:19 AM  Result Value Ref Range   Acetaminophen  (Tylenol ), Serum <10 (L) 10 - 30 ug/mL    Comment: (NOTE) Toxic concentrations can be more effectively related to post dose interval; > 200, > 100, and > 50 ug/mL serum concentrations correspond  to toxic concentrations at 4, 8, and 12 hours post dose, respectively.  Performed at [facility], [address] [place], [place] [address]   Ethanol     Status: None   Collection Time: 01/12/24 11:19 AM  Result Value Ref Range   Alcohol, Ethyl (B) <15 <15 mg/dL    Comment: (NOTE) For medical purposes only. Performed at [facility], [address] [place], [place] [address]   Salicylate level     Status: Abnormal   Collection Time: 01/12/24 11:19 AM  Result Value Ref Range   Salicylate Lvl <7.0 (L) 7.0 - 30.0 mg/dL    Comment: Performed at [facility], [address] [place], [place] [address]  Hepatic function panel     Status: Abnormal   Collection Time: 01/12/24 11:19 AM  Result Value Ref Range   Total Protein 7.5 6.5 - 8.1 g/dL   Albumin 3.6 3.5 - 5.0 g/dL   AST 169 (H) 15 - 41 U/L   ALT 264 (H) 0 - 44 U/L   Alkaline Phosphatase 77 38 - 126 U/L   Total Bilirubin 2.1 (H) 0.0 - 1.2 mg/dL   Bilirubin, Direct 1.4 (H) 0.0 - 0.2 mg/dL   Indirect Bilirubin 0.8 0.3 - 0.9 mg/dL    Comment: Performed at [facility], [address] [place],  [place] [address]  I-Stat venous blood gas, ED     Status: Abnormal   Collection Time: 01/12/24 12:00 PM  Result Value Ref Range   pH, Ven 7.376 7.25 - 7.43   pCO2, Ven 27.0 (L) 44 - 60 mmHg   pO2, Ven 57 (H) 32 - 45 mmHg   Bicarbonate 15.6 (L) 20.0 - 28.0 mmol/L   TCO2 16 (L) 22 - 32 mmol/L   O2 Saturation 87 %   Acid-base deficit 8.0 (H) 0.0 - 2.0 mmol/L   Sodium 130 (L) 135 - 145 mmol/L   Potassium 3.6 3.5 - 5.1 mmol/L   Calcium, Ion 0.97 (L) 1.15 - 1.40 mmol/L   HCT 33.0 (L) 39.0 - 52.0 %   Hemoglobin 11.2 (L) 13.0 - 17.0 g/dL   Patient temperature 898.3 F    Collection site IV start    Drawn by Nurse    Sample type VENOUS   Resp panel by RT-PCR (RSV, Flu A&B, Covid) Anterior Nasal Swab     Status: None   Collection Time: 01/12/24 12:30 PM   Specimen: Anterior Nasal Swab  Result Value Ref Range   SARS Coronavirus 2 by RT PCR NEGATIVE NEGATIVE    Comment: (NOTE) SARS-CoV-2 target nucleic acids are NOT DETECTED.  The SARS-CoV-2 RNA is generally detectable in upper respiratory specimens during the acute phase of infection. The lowest concentration of SARS-CoV-2 viral copies this assay can detect is 138 copies/mL. A negative result does not preclude SARS-Cov-2 infection and should not be used as the sole basis for treatment or other patient management decisions. A negative result may occur with  improper specimen collection/handling, submission of specimen other than nasopharyngeal swab, presence of viral mutation(s) within the areas targeted by this assay, and inadequate number of viral copies(<138 copies/mL). A negative result must be combined with clinical observations, patient history, and epidemiological information. The expected result is Negative.  Fact Sheet for Patients:  BloggerCourse.com  Fact Sheet for Healthcare Providers:  SeriousBroker.it  This test is no t yet approved or cleared by the [place]  FDA and  has  been authorized for detection and/or diagnosis of SARS-CoV-2 by FDA under an Emergency Use Authorization (EUA). This EUA will remain  in effect (meaning this test can be used) for the duration of the COVID-19 declaration under Section 564(b)(1) of the Act, 21 U.S.C.section 360bbb-3(b)(1), unless the authorization is terminated  or revoked sooner.       Influenza A by PCR NEGATIVE NEGATIVE   Influenza B by PCR NEGATIVE NEGATIVE    Comment: (NOTE) The Xpert Xpress SARS-CoV-2/FLU/RSV plus assay is intended as an aid in the diagnosis of influenza from Nasopharyngeal swab specimens and should not be used as a sole basis for treatment. Nasal washings and aspirates are unacceptable for Xpert Xpress SARS-CoV-2/FLU/RSV testing.  Fact Sheet for Patients: BloggerCourse.com  Fact Sheet for Healthcare Providers: SeriousBroker.it  This test is not yet approved or cleared by the [place]  FDA and has been authorized for detection and/or diagnosis of SARS-CoV-2 by FDA under an Emergency Use Authorization (EUA). This EUA will remain in effect (meaning this test can be used) for the duration of the COVID-19 declaration under Section 564(b)(1) of the Act, 21 U.S.C. section 360bbb-3(b)(1), unless the authorization is terminated or revoked.     Resp Syncytial Virus by PCR NEGATIVE NEGATIVE    Comment: (NOTE) Fact Sheet for Patients: BloggerCourse.com  Fact Sheet for Healthcare Providers: SeriousBroker.it  This test is not yet approved or cleared by the [place]  FDA and has been authorized for detection and/or diagnosis of SARS-CoV-2 by FDA under an Emergency Use Authorization (EUA). This EUA will remain in effect (meaning this test can be used) for the duration of the COVID-19 declaration under Section 564(b)(1) of the Act, 21 U.S.C. section 360bbb-3(b)(1), unless the authorization is  terminated or revoked.  Performed at [facility], [address] [place], [place] [address]   Urinalysis, w/ Reflex to Culture (Infection Suspected) -Urine, Clean Catch     Status: Abnormal   Collection Time: 01/12/24  1:38 PM  Result Value Ref Range   Specimen Source URINE, CLEAN CATCH    Color, Urine AMBER (A) YELLOW    Comment: BIOCHEMICALS MAY BE AFFECTED BY COLOR   APPearance CLOUDY (A) CLEAR   Specific Gravity, Urine >=1.030 1.005 - 1.030   pH 5.0 5.0 - 8.0   Glucose, UA 250 (A) NEGATIVE mg/dL   Hgb urine dipstick LARGE (A) NEGATIVE   Bilirubin Urine MODERATE (A) NEGATIVE   Ketones, ur NEGATIVE NEGATIVE mg/dL   Protein, ur >=699 (A) NEGATIVE mg/dL   Nitrite NEGATIVE NEGATIVE   Leukocytes,Ua NEGATIVE NEGATIVE   Squamous Epithelial / HPF 0-5 0 - 5 /HPF   WBC, UA 0-5 0 - 5 WBC/hpf    Comment: Reflex urine culture not performed if WBC <=10, OR if Squamous epithelial cells >5. If Squamous epithelial cells >5, suggest recollection.   RBC / HPF 0-5 0 - 5 RBC/hpf   Bacteria, UA MANY (A) NONE SEEN   Mucus PRESENT    Sperm, UA PRESENT    Urine-Other LESS THAN 10 mL OF URINE SUBMITTED     Comment: MICROSCOPIC EXAM PERFORMED ON UNCONCENTRATED URINE Performed at [facility], [address], [place], [place] [address]   Pro Brain natriuretic peptide     Status: Abnormal   Collection Time: 01/12/24  1:41 PM  Result Value Ref Range   Pro Brain Natriuretic Peptide 528.0 (H) <300.0 pg/mL    Comment: (NOTE) Age Group        Cut-Points  Interpretation  < 50 years     450 pg/mL       NT-proBNP > 450 pg/mL indicates                                ADHF is likely              50 to 75 years  900 pg/mL      NT-proBNP > 900 pg/mL indicates          ADHF is likely  > 75 years      1800 pg/mL     NT-proBNP > 1800 pg/mL indicates          ADHF is likely                           All ages    Results between       Indeterminate. Further clinical             300 and  the cut-   information is needed to determine            point for age group   if ADHF is present.                                                             Elecsys proBNP II/ Elecsys proBNP II STAT           Cut-Point                       Interpretation  300 pg/mL                    NT-proBNP <300pg/mL indicates                             ADHF is not likely  Performed at [facility], [address] [place], [place] [address]   Gamma GT     Status: Abnormal   Collection Time: 01/12/24  1:41 PM  Result Value Ref Range   GGT 52 (H) 7 - 50 U/L    Comment: Performed at [facility], [address], [place], [place] [address]  Basic metabolic panel     Status: Abnormal   Collection Time: 01/12/24  1:41 PM  Result Value Ref Range   Sodium 129 (L) 135 - 145 mmol/L   Potassium 3.7 3.5 - 5.1 mmol/L   Chloride 95 (L) 98 - 111 mmol/L   CO2 14 (L) 22 - 32 mmol/L   Glucose, Bld 305 (H) 70 - 99 mg/dL    Comment: Glucose reference range applies only to samples taken after fasting for at least 8 hours.   BUN 51 (H) 6 - 20 mg/dL   Creatinine, Ser 1.18 (H) 0.61 - 1.24 mg/dL   Calcium 7.9 (L) 8.9 - 10.3 mg/dL   GFR, Estimated 6 (L) >60 mL/min    Comment: (NOTE) Calculated using the CKD-EPI Creatinine Equation (2021)    Anion gap 20 (H) 5 - 15    Comment: Performed at [facility], [address] [place],  [place] [address]  Sedimentation rate     Status: Abnormal   Collection Time: 01/12/24  1:41 PM  Result Value Ref Range   Sed Rate 79 (H) 0 - 16 mm/hr    Comment: Performed at [facility], [address] [place], [place] [address]  Magnesium     Status: None   Collection Time: 01/12/24  1:41 PM  Result Value Ref Range   Magnesium 2.0 1.7 - 2.4 mg/dL    Comment: Performed at [facility], [address] [place], [place] [address]  C-reactive protein     Status: Abnormal   Collection Time: 01/12/24  2:40 PM  Result Value Ref  Range   CRP 31.7 (H) <1.0 mg/dL    Comment: Performed at [facility], [address] [place], [place] [address]  I-Stat arterial blood gas, ED [facility], [facility], [facility])     Status: Abnormal   Collection Time: 01/12/24  2:41 PM  Result Value Ref Range   pH, Arterial 7.305 (L) 7.35 - 7.45   pCO2 arterial 30.9 (L) 32 - 48 mmHg   pO2, Arterial 76 (L) 83 - 108 mmHg   Bicarbonate 15.1 (L) 20.0 - 28.0 mmol/L   TCO2 16 (L) 22 - 32 mmol/L   O2 Saturation 92 %   Acid-base deficit 10.0 (H) 0.0 - 2.0 mmol/L   Sodium 131 (L) 135 - 145 mmol/L   Potassium 3.8 3.5 - 5.1 mmol/L   Calcium, Ion 1.05 (L) 1.15 - 1.40 mmol/L   HCT 30.0 (L) 39.0 - 52.0 %   Hemoglobin 10.2 (L) 13.0 - 17.0 g/dL   Patient temperature 898.3 F    Collection site RADIAL, ALLEN'S TEST ACCEPTABLE    Drawn by Operator    Sample type ARTERIAL   Lactic acid, plasma     Status: None   Collection Time: 01/12/24  2:45 PM  Result Value Ref Range   Lactic Acid, Venous 1.6 0.5 - 1.9 mmol/L    Comment: Performed at [facility], [address] [place], [place] [address]  Glucose, capillary     Status: Abnormal   Collection Time: 01/12/24  7:55 PM  Result Value Ref Range   Glucose-Capillary 281 (H) 70 - 99 mg/dL    Comment: Glucose reference range applies only to samples taken after fasting for at least 8 hours.  ANA w/Reflex if Positive     Status: None   Collection Time: 01/12/24  8:49 PM  Result Value Ref Range   Anti Nuclear Antibody (ANA) Negative Negative    Comment: (NOTE) Performed At: [facility] [address] [place], [place] [id number] Jennette Shorter MD Ey:[id number]   CK     Status: Abnormal   Collection Time: 01/12/24  8:49 PM  Result Value Ref Range   Total CK 13,277 (H) 49 - 397 U/L    Comment: RESULT CONFIRMED BY MANUAL DILUTION Performed at [facility], [address], [place], [place] [address]   Lactate dehydrogenase     Status: Abnormal   Collection Time: 01/12/24  8:49 PM   Result Value Ref Range   LDH 1,539 (H) 98 - 192 U/L    Comment: Performed at [facility], [address] [place], [place] [address]  Procalcitonin     Status: None   Collection Time: 01/12/24  8:49 PM  Result Value Ref Range   Procalcitonin 17.42 ng/mL    Comment:  Interpretation: PCT >= 10 ng/mL: Important systemic inflammatory response, almost exclusively due to severe bacterial sepsis or septic shock. (NOTE)       Sepsis PCT Algorithm           Lower Respiratory Tract                                      Infection PCT Algorithm    ----------------------------     ----------------------------         PCT < 0.25 ng/mL                PCT < 0.10 ng/mL          Strongly encourage             Strongly discourage   discontinuation of antibiotics    initiation of antibiotics    ----------------------------     -----------------------------       PCT 0.25 - 0.50 ng/mL            PCT 0.10 - 0.25 ng/mL               OR       >80% decrease in PCT            Discourage initiation of                                            antibiotics      Encourage discontinuation           of antibiotics    ----------------------------     -----------------------------         PCT >= 0.50 ng/mL              PCT 0.26 - 0.50 ng/mL                AND       <80% decrease in PCT             Encourage initiation of                                             antibiotics       Encourage continuation           of antibiotics    ----------------------------     -----------------------------        PCT >= 0.50 ng/mL                  PCT > 0.50 ng/mL               AND         increase in PCT                  Strongly encourage                                      initiation of antibiotics    Strongly encourage escalation           of antibiotics                                     -----------------------------  PCT <= 0.25 ng/mL                                                  OR                                        > 80% decrease in PCT                                      Discontinue / Do not initiate                                             antibiotics  Performed at [facility], [address], [place], [place] [address]   Hemoglobin A1c     Status: Abnormal   Collection Time: 01/12/24  8:49 PM  Result Value Ref Range   Hgb A1c MFr Bld 7.6 (H) 4.8 - 5.6 %    Comment: (NOTE) Diagnosis of Diabetes The following HbA1c ranges recommended by the American Diabetes Association (ADA) may be used as an aid in the diagnosis of diabetes mellitus.  Hemoglobin             Suggested A1C NGSP%              Diagnosis  <5.7                   Non Diabetic  5.7-6.4                Pre-Diabetic  >6.4                   Diabetic  <7.0                   Glycemic control for                       adults with diabetes.     Mean Plasma Glucose 171.42 mg/dL    Comment: Performed at [facility], [address] [place], [place] [address]  Lipase, blood     Status: Abnormal   Collection Time: 01/12/24  8:49 PM  Result Value Ref Range   Lipase 163 (H) 11 - 51 U/L    Comment: Performed at [facility], [address], [place], [place] [address]  HIV Antibody (routine testing w rflx)     Status: None   Collection Time: 01/12/24  8:49 PM  Result Value Ref Range   HIV Screen 4th Generation wRfx Non Reactive Non Reactive    Comment: Performed at [facility], [address], [place], [place] [address]  Strep pneumoniae urinary antigen     Status: None   Collection Time: 01/12/24  9:11 PM  Result Value Ref Range   Strep Pneumo Urinary Antigen NEGATIVE NEGATIVE    Comment:        Infection due to S. pneumoniae cannot be absolutely ruled out since the antigen present may be below the detection limit of the test. Performed  at [facility], [address], [place], [place] [address]   Glucose, capillary      Status: Abnormal   Collection Time: 01/12/24 11:37 PM  Result Value Ref Range   Glucose-Capillary 332 (H) 70 - 99 mg/dL    Comment: Glucose reference range applies only to samples taken after fasting for at least 8 hours.  Glucose, capillary     Status: Abnormal   Collection Time: 01/13/24  3:52 AM  Result Value Ref Range   Glucose-Capillary 253 (H) 70 - 99 mg/dL    Comment: Glucose reference range applies only to samples taken after fasting for at least 8 hours.  Protime-INR     Status: Abnormal   Collection Time: 01/13/24  5:12 AM  Result Value Ref Range   Prothrombin Time 15.8 (H) 11.4 - 15.2 seconds   INR 1.2 0.8 - 1.2    Comment: (NOTE) INR goal varies based on device and disease states. Performed at [facility], [address] [place], [place] [address]   Cortisol-am, blood     Status: Abnormal   Collection Time: 01/13/24  5:12 AM  Result Value Ref Range   Cortisol - AM 22.9 (H) 6.7 - 22.6 ug/dL    Comment: Performed at [facility], [address] [place], [place] [address]  Comprehensive metabolic panel     Status: Abnormal   Collection Time: 01/13/24  5:12 AM  Result Value Ref Range   Sodium 133 (L) 135 - 145 mmol/L   Potassium 3.4 (L) 3.5 - 5.1 mmol/L   Chloride 100 98 - 111 mmol/L   CO2 17 (L) 22 - 32 mmol/L   Glucose, Bld 264 (H) 70 - 99 mg/dL    Comment: Glucose reference range applies only to samples taken after fasting for at least 8 hours.   BUN 64 (H) 6 - 20 mg/dL   Creatinine, Ser 89.85 (H) 0.61 - 1.24 mg/dL   Calcium 7.1 (L) 8.9 - 10.3 mg/dL   Total Protein 5.9 (L) 6.5 - 8.1 g/dL   Albumin 2.3 (L) 3.5 - 5.0 g/dL   AST 562 (H) 15 - 41 U/L   ALT 167 (H) 0 - 44 U/L   Alkaline Phosphatase 66 38 - 126 U/L   Total Bilirubin 1.4 (H) 0.0 - 1.2 mg/dL   GFR, Estimated 5 (L) >60 mL/min    Comment: (NOTE) Calculated using the CKD-EPI Creatinine Equation (2021)    Anion gap 16 (H) 5 - 15    Comment: Performed at [facility], [address], [place], [place] [address]  CBC     Status: Abnormal   Collection Time: 01/13/24  5:12 AM  Result Value Ref Range   WBC 6.3 4.0 - 10.5 K/uL   RBC 3.58 (L) 4.22 - 5.81 MIL/uL   Hemoglobin 10.7 (L) 13.0 - 17.0 g/dL   HCT 70.5 (L) 60.9 - 47.9 %   MCV 82.1 80.0 - 100.0 fL   MCH 29.9 26.0 - 34.0 pg   MCHC 36.4 (H) 30.0 - 36.0 g/dL   RDW 86.2 88.4 - 84.4 %   Platelets 179 150 - 400 K/uL   nRBC 0.0 0.0 - 0.2 %    Comment: Performed at [facility], [address], [place], [place] [address]  Magnesium     Status: None   Collection Time: 01/13/24  5:12 AM  Result Value Ref Range   Magnesium 2.4 1.7 - 2.4 mg/dL  Comment: Performed at [facility], [address] [place], [place] [address]  APTT     Status: Abnormal   Collection Time: 01/13/24  5:12 AM  Result Value Ref Range   aPTT 40 (H) 24 - 36 seconds    Comment:        IF BASELINE aPTT IS ELEVATED, SUGGEST PATIENT RISK ASSESSMENT BE USED TO DETERMINE APPROPRIATE ANTICOAGULANT THERAPY. Performed at [facility], [address], [place], [place] [address]   Ammonia     Status: Abnormal   Collection Time: 01/13/24  5:12 AM  Result Value Ref Range   Ammonia 46 (H) 9 - 35 umol/L    Comment: Performed at [facility], [address], [place], [place] [address]  CK     Status: Abnormal   Collection Time: 01/13/24  5:12 AM  Result Value Ref Range   Total CK 10,078 (H) 49 - 397 U/L    Comment: RESULT CONFIRMED BY MANUAL DILUTION Performed at [facility], [address] [place], [place] [address]   Copper , serum     Status: None   Collection Time: 01/13/24  5:12 AM  Result Value Ref Range   Copper  109 69 - 132 ug/dL    Comment: (NOTE) This test was developed and its performance characteristics determined by [facility]. It has not been cleared or approved by the Food and Drug Administration.                                Detection Limit = 5 Performed At: [facility] [place] [address]  [place], [place] [id number] Jennette Shorter MD Ey:[id number]   Hepatitis panel, acute     Status: None   Collection Time: 01/13/24  5:12 AM  Result Value Ref Range   Hepatitis B Surface Ag NON REACTIVE NON REACTIVE   HCV Ab NON REACTIVE NON REACTIVE    Comment: (NOTE) Nonreactive HCV antibody screen is consistent with no HCV infections,  unless recent infection is suspected or other evidence exists to indicate HCV infection.     Hep A IgM NON REACTIVE NON REACTIVE   Hep B C IgM NON REACTIVE NON REACTIVE    Comment: Performed at [facility], [address], [place], [place] [address]  Renal function panel (daily at 0500)     Status: Abnormal   Collection Time: 01/13/24  5:14 AM  Result Value Ref Range   Sodium 133 (L) 135 - 145 mmol/L   Potassium 3.4 (L) 3.5 - 5.1 mmol/L   Chloride 100 98 - 111 mmol/L   CO2 17 (L) 22 - 32 mmol/L   Glucose, Bld 257 (H) 70 - 99 mg/dL    Comment: Glucose reference range applies only to samples taken after fasting for at least 8 hours.   BUN 63 (H) 6 - 20 mg/dL   Creatinine, Ser 89.82 (H) 0.61 - 1.24 mg/dL   Calcium 7.1 (L) 8.9 - 10.3 mg/dL   Phosphorus 5.6 (H) 2.5 - 4.6 mg/dL   Albumin 2.3 (L) 3.5 - 5.0 g/dL   GFR, Estimated 5 (L) >60 mL/min    Comment: (NOTE) Calculated using the CKD-EPI Creatinine Equation (2021)    Anion gap 16 (H) 5 - 15    Comment: Performed at [facility], [address] [place], [place] [address]  Rapid urine drug screen (hospital performed)     Status:  None   Collection Time: 01/13/24  6:00 AM  Result Value Ref Range   Opiates NONE DETECTED NONE DETECTED   Cocaine NONE DETECTED NONE DETECTED   Benzodiazepines NONE DETECTED NONE DETECTED   Amphetamines NONE DETECTED NONE DETECTED   Tetrahydrocannabinol NONE DETECTED NONE DETECTED   Barbiturates NONE DETECTED NONE DETECTED    Comment: (NOTE) DRUG SCREEN FOR MEDICAL PURPOSES ONLY.  IF CONFIRMATION IS NEEDED FOR ANY PURPOSE, NOTIFY LAB WITHIN 5  DAYS.  LOWEST DETECTABLE LIMITS FOR URINE DRUG SCREEN Drug Class                     Cutoff (ng/mL) Amphetamine and metabolites    1000 Barbiturate and metabolites    200 Benzodiazepine                 200 Opiates and metabolites        300 Cocaine and metabolites        300 THC                            50 Performed at [facility], [address] [place], [place] [address]   Glucose, capillary     Status: Abnormal   Collection Time: 01/13/24  7:53 AM  Result Value Ref Range   Glucose-Capillary 180 (H) 70 - 99 mg/dL    Comment: Glucose reference range applies only to samples taken after fasting for at least 8 hours.  Gastrointestinal Panel by PCR , Stool     Status: None   Collection Time: 01/13/24  8:50 AM   Specimen: Stool  Result Value Ref Range   Campylobacter species NOT DETECTED NOT DETECTED   Plesimonas shigelloides NOT DETECTED NOT DETECTED   Salmonella species NOT DETECTED NOT DETECTED   Yersinia enterocolitica NOT DETECTED NOT DETECTED   Vibrio species NOT DETECTED NOT DETECTED   Vibrio cholerae NOT DETECTED NOT DETECTED   Enteroaggregative E coli (EAEC) NOT DETECTED NOT DETECTED   Enteropathogenic E coli (EPEC) NOT DETECTED NOT DETECTED   Enterotoxigenic E coli (ETEC) NOT DETECTED NOT DETECTED   Shiga like toxin producing E coli (STEC) NOT DETECTED NOT DETECTED   Shigella/Enteroinvasive E coli (EIEC) NOT DETECTED NOT DETECTED   Cryptosporidium NOT DETECTED NOT DETECTED   Cyclospora cayetanensis NOT DETECTED NOT DETECTED   Entamoeba histolytica NOT DETECTED NOT DETECTED   Giardia lamblia NOT DETECTED NOT DETECTED   Adenovirus F40/41 NOT DETECTED NOT DETECTED   Astrovirus NOT DETECTED NOT DETECTED   Norovirus GI/GII NOT DETECTED NOT DETECTED   Rotavirus A NOT DETECTED NOT DETECTED   Sapovirus (I, II, IV, and V) NOT DETECTED NOT DETECTED    Comment: Performed at [facility], [address] [place], [place] [address]  Glucose, capillary      Status: Abnormal   Collection Time: 01/13/24 11:28 AM  Result Value Ref Range   Glucose-Capillary 185 (H) 70 - 99 mg/dL    Comment: Glucose reference range applies only to samples taken after fasting for at least 8 hours.  Glucose, capillary     Status: Abnormal   Collection Time: 01/13/24  3:52 PM  Result Value Ref Range   Glucose-Capillary 162 (H) 70 - 99 mg/dL    Comment: Glucose reference range applies only to samples taken after fasting for at least 8 hours.  Renal function panel (daily at 1600)     Status: Abnormal   Collection Time: 01/13/24  4:08 PM  Result Value Ref Range   Sodium 135 135 - 145 mmol/L   Potassium 3.2 (L) 3.5 - 5.1 mmol/L   Chloride 101 98 - 111 mmol/L   CO2 22 22 - 32 mmol/L   Glucose, Bld 155 (H) 70 - 99 mg/dL    Comment: Glucose reference range applies only to samples taken after fasting for at least 8 hours.   BUN 45 (H) 6 - 20 mg/dL   Creatinine, Ser 3.01 (H) 0.61 - 1.24 mg/dL   Calcium 7.4 (L) 8.9 - 10.3 mg/dL   Phosphorus 2.9 2.5 - 4.6 mg/dL   Albumin 2.5 (L) 3.5 - 5.0 g/dL   GFR, Estimated 8 (L) >60 mL/min    Comment: (NOTE) Calculated using the CKD-EPI Creatinine Equation (2021)    Anion gap 12 5 - 15    Comment: Performed at [facility], [address], [place], [place] [address]  Glucose, capillary     Status: Abnormal   Collection Time: 01/13/24  7:31 PM  Result Value Ref Range   Glucose-Capillary 135 (H) 70 - 99 mg/dL    Comment: Glucose reference range applies only to samples taken after fasting for at least 8 hours.  Glucose, capillary     Status: Abnormal   Collection Time: 01/13/24 11:15 PM  Result Value Ref Range   Glucose-Capillary 131 (H) 70 - 99 mg/dL    Comment: Glucose reference range applies only to samples taken after fasting for at least 8 hours.  Glucose, capillary     Status: Abnormal   Collection Time: 01/14/24  3:16 AM  Result Value Ref Range   Glucose-Capillary 167 (H) 70 - 99 mg/dL    Comment: Glucose  reference range applies only to samples taken after fasting for at least 8 hours.  Glucose, capillary     Status: Abnormal   Collection Time: 01/14/24  7:57 AM  Result Value Ref Range   Glucose-Capillary 174 (H) 70 - 99 mg/dL    Comment: Glucose reference range applies only to samples taken after fasting for at least 8 hours.   ECHOCARDIOGRAM COMPLETE Result Date: 01/13/2024    ECHOCARDIOGRAM REPORT   Patient Name:   STEPHANO ARRANTS Date of Exam: 01/13/2024 Medical Rec #:  [id number]       Height:       72.0 in Accession #:    [id number]      Weight:       241.2 lb Date of Birth:  1965/12/17       BSA:          2.307 m Patient Age:    58 years        BP:           144/90 mmHg Patient Gender: M               HR:           93 bpm. Exam Location:  Inpatient Procedure: 2D Echo, Color Doppler and Cardiac Doppler (Both Spectral and Color            Flow Doppler were utilized during procedure). Indications:    CHF- Acute Systolic I50.21  History:        Patient has no prior history of Echocardiogram examinations.                 Renal Failure; Risk Factors:Hypertension, Dyslipidemia and  Diabetes.  Sonographer:    Koleen Popper RDCS Referring Phys: [phone] EKTA V PATEL IMPRESSIONS  1. Left ventricular ejection fraction, by estimation, is 65 to 70%. The left ventricle has normal function. The left ventricle has no regional wall motion abnormalities. There is severe concentric left ventricular hypertrophy. Left ventricular diastolic  parameters were normal.  2. Right ventricular systolic function is normal. The right ventricular size is normal. Tricuspid regurgitation signal is inadequate for assessing PA pressure.  3. The mitral valve is grossly normal. Mild mitral valve regurgitation. No evidence of mitral stenosis.  4. The aortic valve is tricuspid. Aortic valve regurgitation is not visualized. No aortic stenosis is present.  5. The inferior vena cava is normal in size with <50% respiratory  variability, suggesting right atrial pressure of 8 mmHg. FINDINGS  Left Ventricle: Left ventricular ejection fraction, by estimation, is 65 to 70%. The left ventricle has normal function. The left ventricle has no regional wall motion abnormalities. The left ventricular internal cavity size was normal in size. There is  severe concentric left ventricular hypertrophy. Left ventricular diastolic parameters were normal. Right Ventricle: The right ventricular size is normal. No increase in right ventricular wall thickness. Right ventricular systolic function is normal. Tricuspid regurgitation signal is inadequate for assessing PA pressure. Left Atrium: Left atrial size was normal in size. Right Atrium: Right atrial size was normal in size. Pericardium: There is no evidence of pericardial effusion. Mitral Valve: The mitral valve is grossly normal. Mild mitral valve regurgitation. No evidence of mitral valve stenosis. Tricuspid Valve: The tricuspid valve is normal in structure. Tricuspid valve regurgitation is trivial. No evidence of tricuspid stenosis. Aortic Valve: The aortic valve is tricuspid. Aortic valve regurgitation is not visualized. No aortic stenosis is present. Pulmonic Valve: The pulmonic valve was normal in structure. Pulmonic valve regurgitation is not visualized. No evidence of pulmonic stenosis. Aorta: The aortic root is normal in size and structure. Venous: The inferior vena cava is normal in size with less than 50% respiratory variability, suggesting right atrial pressure of 8 mmHg. IAS/Shunts: The atrial septum is grossly normal.  LEFT VENTRICLE PLAX 2D LVIDd:         4.70 cm   Diastology LVIDs:         2.90 cm   LV e' medial:    8.05 cm/s LV PW:         1.60 cm   LV E/e' medial:  12.1 LV IVS:        1.60 cm   LV e' lateral:   12.20 cm/s LVOT diam:     2.10 cm   LV E/e' lateral: 8.0 LV SV:         79 LV SV Index:   34 LVOT Area:     3.46 cm  RIGHT VENTRICLE             IVC RV S prime:     14.70 cm/s   IVC diam: 1.80 cm TAPSE (M-mode): 2.0 cm LEFT ATRIUM             Index        RIGHT ATRIUM           Index LA diam:        4.00 cm 1.73 cm/m   RA Area:     15.30 cm LA Vol (A2C):   36.5 ml 15.82 ml/m  RA Volume:   35.80 ml  15.52 ml/m LA Vol (A4C):   62.9 ml 27.26 ml/m LA  Biplane Vol: 50.7 ml 21.98 ml/m  AORTIC VALVE LVOT Vmax:   138.00 cm/s LVOT Vmean:  94.600 cm/s LVOT VTI:    0.229 m  AORTA Ao Root diam: 3.10 cm Ao Asc diam:  3.40 cm MITRAL VALVE MV Area (PHT): 4.31 cm    SHUNTS MV Decel Time: 176 msec    Systemic VTI:  0.23 m MV E velocity: 97.50 cm/s  Systemic Diam: 2.10 cm MV A velocity: 92.80 cm/s MV E/A ratio:  1.05 Soyla Merck MD Electronically signed by Soyla Merck MD Signature Date/Time: 01/13/2024/11:17:42 PM    Final    DG Chest Port 1 View Result Date: 01/13/2024 CLINICAL DATA:  Tunneled central venous catheter placement EXAM: PORTABLE CHEST 1 VIEW COMPARISON:  01/12/2024 FINDINGS: Left internal jugular temporary hemodialysis catheter tip seen overlying the expected brachiocephalic vein. No pneumothorax. Progressive consolidation within the left lung. Small left pleural effusion suspected. Right lung is clear. No pleural effusion on the right. Cardiac size within normal limits. No acute bone abnormality. IMPRESSION: 1. Left internal jugular temporary hemodialysis catheter tip within the brachiocephalic vein. No pneumothorax. 2. Progressive consolidation within the left lung. Associated small left pleural effusion. Electronically Signed   By: Dorethia Molt M.D.   On: 01/13/2024 03:39   CT HEAD WO CONTRAST ( ) Result Date: 01/13/2024 CLINICAL DATA:  Seizure, new-onset, no history of trauma EXAM: CT HEAD WITHOUT CONTRAST TECHNIQUE: Contiguous axial images were obtained from the base of the skull through the vertex without intravenous contrast. RADIATION DOSE REDUCTION: This exam was performed according to the departmental dose-optimization program which includes automated exposure  control, adjustment of the mA and/or kV according to patient size and/or use of iterative reconstruction technique. COMPARISON:  None Available. FINDINGS: Brain: No evidence of acute infarction, hemorrhage, hydrocephalus, extra-axial collection or mass lesion/mass effect. Vascular: No hyperdense vessel or unexpected calcification. Skull: Normal. Negative for fracture or focal lesion. Sinuses/Orbits: No acute finding. IMPRESSION: No evidence of acute abnormality. Electronically Signed   By: Gilmore GORMAN Molt M.D.   On: 01/13/2024 01:04   CT CHEST ABDOMEN PELVIS WO CONTRAST Result Date: 01/12/2024 CLINICAL DATA:  Sepsis EXAM: CT CHEST, ABDOMEN AND PELVIS WITHOUT CONTRAST TECHNIQUE: Multidetector CT imaging of the chest, abdomen and pelvis was performed following the standard protocol without IV contrast. RADIATION DOSE REDUCTION: This exam was performed according to the departmental dose-optimization program which includes automated exposure control, adjustment of the mA and/or kV according to patient size and/or use of iterative reconstruction technique. COMPARISON:  10/25/2023 FINDINGS: CT CHEST FINDINGS Cardiovascular: Mild cardiomegaly. Minimal atheromatous vascular calcification of the thoracic aorta. Mediastinum/Nodes: Likely reactive prevascular node 1.0 cm in short axis on image 20 series 301. Lungs/Pleura: New consolidation in a substantial portion of the left upper lobe and in the superior segment left lower lobe favoring multilobar pneumonia. Associated air bronchograms without mass effect on the associated airways. Small left pleural effusion. Musculoskeletal: Subacute healing left anterior eighth rib fracture on image 99 series 302, not present/perceptible on 10/25/2023. Thoracic spondylosis. Mild bilateral gynecomastia. CT ABDOMEN PELVIS FINDINGS Hepatobiliary: Diffuse hepatic steatosis. No focal hepatic lesion identified on today's noncontrast exam. Pancreas: Unremarkable Spleen: Unremarkable  Adrenals/Urinary Tract: Unremarkable Stomach/Bowel: Distal colonic and rectal air-levels favoring diarrheal process. No dilated bowel. Appendix poorly seen today. Vascular/Lymphatic: Unremarkable Reproductive: Unremarkable Other: No supplemental non-categorized findings. Musculoskeletal: Stable localized sclerosis along the iliac side of the left SI joint is probably related to arthropathy although technically nonspecific. No change from 10/25/2023. Mild lower lumbar spondylosis and degenerative disc  disease. IMPRESSION: 1. New consolidation in a substantial portion of the left upper lobe and in the superior segment left lower lobe favoring multilobar pneumonia. Associated air bronchograms without mass effect on the associated airways. Small left pleural effusion. 2. Subacute healing left anterior eighth rib fracture, not present/perceptible on 10/25/2023. 3. Diffuse hepatic steatosis. 4. Distal colonic and rectal air-levels favoring diarrheal process. 5. Mild cardiomegaly. 6. Stable localized sclerosis along the iliac side of the left SI joint is probably related to arthropathy although technically nonspecific. No change from 10/25/2023. 7. Mild lower lumbar spondylosis and degenerative disc disease. Electronically Signed   By: Ryan Salvage M.D.   On: 01/12/2024 12:16   DG Chest Portable 1 View Result Date: 01/12/2024 CLINICAL DATA:  Cough. EXAM: PORTABLE CHEST 1 VIEW COMPARISON:  10/25/2023 FINDINGS: The heart size and mediastinal contours are within normal limits. Patient is partially rotated to the left. Asymmetric opacity in the left hemithorax is probably due to overlying soft tissue attenuation, with left lung infiltrate considered less likely. No pleural effusion. IMPRESSION: Asymmetric opacity in left hemithorax, probably due to overlying soft tissue attenuation, with left lung opacity considered less likely. Recommend routine PA and lateral chest radiographs for further evaluation. Electronically Signed    By: Norleen DELENA Kil M.D.   On: 01/12/2024 10:51    PMH:   Past Medical History:  Diagnosis Date   Diabetes mellitus without complication (HCC)    Hyperlipidemia    Hypertension     PSH:  History reviewed. No pertinent surgical history.  Allergies: No Known Allergies  Medications:   Prior to Admission medications   Medication Sig Start Date End Date Taking? Authorizing Provider  amLODipine  (NORVASC ) 5 MG tablet Take 1 tablet (5 mg total) by mouth daily. 08/29/23  Yes Thapa, [place], MD  atorvastatin (LIPITOR) 80 MG tablet Take 80 mg by mouth daily.   Yes [provider]  doxazosin  (CARDURA ) 2 MG tablet Take 2 mg by mouth daily.   Yes [provider]  eplerenone  (INSPRA ) 50 MG tablet Take 1 tablet (50 mg total) by mouth daily. 08/28/23  Yes Thapa, [place], MD  metFORMIN (GLUCOPHAGE) 500 MG tablet Take 500 mg by mouth daily with breakfast.   Yes [provider]  azithromycin  (ZITHROMAX ) 250 MG tablet Take 1 tablet (250 mg total) by mouth daily. 07/15/14   Terryl Kubas, NP  chlorpheniramine-HYDROcodone [facility] PENNKINETIC ER) 10-8 MG/5ML LQCR Take 5 mLs by mouth 2 (two) times daily. 07/15/14   Terryl Kubas, NP  doxazosin  (CARDURA ) 1 MG tablet Take 2 tablets (2 mg total) by mouth daily. Patient not taking: Reported on 01/12/2024 08/28/23   Thapa, [place], MD    Discontinued Meds:   Medications Discontinued During This Encounter  Medication Reason   acetaminophen  (TYLENOL ) tablet 650 mg    atorvastatin (LIPITOR) 40 MG tablet Discontinued by provider   ezetimibe (ZETIA) 10 MG tablet Discontinued by provider   doxazosin  (CARDURA ) 2 MG tablet Discontinued by provider   spironolactone  (ALDACTONE ) 100 MG tablet Discontinued by provider   verapamil  (CALAN -SR) 180 MG CR tablet Discontinued by provider   metFORMIN (GLUCOPHAGE) 500 MG tablet Discontinued by provider   lactated ringers  infusion    metroNIDAZOLE  (FLAGYL ) IVPB 500 mg    ceFEPIme (MAXIPIME) 2 g in sodium  chloride 0.9 % 100 mL IVPB    famotidine  (PEPCID ) IVPB 20 mg premix    azithromycin  (ZITHROMAX ) 500 mg in sodium chloride  0.9 % 250 mL IVPB    metroNIDAZOLE  (FLAGYL ) IVPB 500  mg    LORazepam  (ATIVAN ) injection 2 mg    famotidine  (PEPCID ) tablet 20 mg    famotidine  (PEPCID ) tablet 20 mg    0.9 %  sodium chloride  infusion    metroNIDAZOLE  (FLAGYL ) tablet 500 mg    heparin  injection 5,000 Units     Social History:  reports that he has never smoked. He has never used smokeless tobacco. He reports that he does not drink alcohol. No history on file for drug use.  Family History:  History reviewed. No pertinent family history.  Blood pressure (!) 157/92, pulse 89, temperature (!) 100.9 F (38.3 C), temperature source Oral, resp. rate (!) 38, height 6' (1.829 m), weight 110.4 kg, SpO2 94%. Physical Exam: Gen obtunded, on FM O2, post-ictal No rash, cyanosis or gangrene Sclera anicteric, throat clear  No jvd or bruits Chest clear bilat to bases RRR no MRG Abd soft ntnd no mass or ascites +bs GU foley cath in place MS no joint effusions or deformity Ext no LE or UE edema, no other edema Neuro as above Access LIJ temp     MELIA LYNWOOD ORN, MD 01/14/2024, 9:07 AM

## 2024-01-14 NOTE — Progress Notes (Addendum)
 NAME:  Adam Beasley, MRN:  [id number], DOB:  Apr 18, 1966, LOS: 2 ADMISSION DATE:  01/12/2024  History of Present Illness:  58 yo male [facility] with c/o n/v/d x 4 days. Pt also reported sob and general fatigue at time of presentation. He denies documented fever/chills, no accompanying abdominal pain. Upon presentation pt was noted to actually have fever, was tachycardic and noted to have elevated liver enzymes with normal lactate as well as in acute renal failure with BUN/Cr ~50/>8. He was subsequently transferred to [facility] and admitted to [facility] with nephrology consult.    Sepsis protocol was initiated for the pt, started on broad spectrum abx and volume resuscitation. CT chest abd/pelvis revealed new consolidation on L lung, stigmata of diarrheal process with inflammation and air levels of the distal colon. Unfortunately, shortly after transfer to [facility] pt began having generalized tonic clonic seizure like activity with post ictal ~7min long (unresponsive). RRT called and pt was given 2 mg ativan  which ceased activity. He reportedly had some amount of emesis and subsequently had diminished BS with need for supplemental oxygen.    CCM was asked to transfer to ICU for initiation of line and CRRT as despite the BUN only reporting ~50, thought was that his uremia/aki was resulting in sz like activity. CTH pending, vas cath placement pending and cefepime appropriately stopped after event.    Pt reportedly was in his normal state of health prior to his seemingly GI illness ~4-7 days ago. He additionally was having decreased PO intake during this time. No other complaints offered and pt is unable to coherently discuss HPI/ROS, but is protecting airway. They have consented to transfer to ICU and vas cath placement.  Pertinent  Medical History  HTN Hyperlipidemia DM2 with hyperglycemia Suspected OSA  Significant Hospital Events: Including procedures, antibiotic start and stop dates in  addition to other pertinent events   7/27 - Presented to [facility], transferred to [facility] for admission, later transferred to ICU for initiation of crrt.   Interim History / Subjective:  NAEON. Still coughing  Objective    Blood pressure (!) 157/92, pulse 89, temperature (!) 100.9 F (38.3 C), temperature source Oral, resp. rate (!) 38, height 6' (1.829 m), weight 110.4 kg, SpO2 94%.        Intake/Output Summary (Last 24 hours) at 01/14/2024 9187 Last data filed at 01/14/2024 0700 Gross per 24 hour  Intake 4403.44 ml  Output 3815 ml  Net 588.44 ml   Filed Weights   01/12/24 1621 01/13/24 0500 01/14/24 0443  Weight: 110.1 kg 109.4 kg 110.4 kg    Examination: General: well appearing, NAD Cardiovascular: RRR, no m/r/g Respiratory: normal work of breathing on RA, diminished breath sounds on left, no wheezes Abdomen: Normal bowel sounds, soft, non-tender  Resolved problem list  New onset seizure - suspected 2/2 metabolic derangement vs uremia  High AG metabolic acidosis Assessment and Plan  Acute renal failure Rhabdomyolysis Unclear etiology, consider volume depletion in setting of recent illness and rhabdomyolysis with elevated CK.  Nephrology following Continue CRRT Monitor RFP, UOP Trend CK  N/V/D GIPP negative Supportive care  F/u urine culture, blood culture   Acute hypoxic respiratory failure CT chest concerning for multilobar pneumonia, possible aspiration during seizure  Continue Ceftriaxone  (7/28-), Azithromycin  (7/28-) RPP S/p Metronidazole  (7/28-7/29) Considered tracheal aspirate but he does not have sputum production  Wean O2 as able Aspiration precautions  F/u legionella, strep pneumoniae   Hx HTN, HLD Restarted home amlodipine , doxazosin  Holding  eplerenone  due to creatinine clearance Holding home lipitor  PRN hydralazine  for SBP >160   Transaminitis Awaiting hepatic panel from this morning GI evaluated, recommended abdominal US  CTM LFTs Can have  tylenol  no more than 3g/day   T2DM with hyperglycemia Holding home metformin SSI very sensitive    Suspected OSA Apneic periods during breathing noted during hospitalization Consider OP sleep study    Best Practice (right click and Reselect all SmartList Selections daily)   Diet/type: Regular consistency (see orders) DVT prophylaxis prophylactic heparin   Pressure ulcer(s): N/A GI prophylaxis: N/A Lines: Dialysis Catheter Foley:  N/A Code Status:  full code Last date of multidisciplinary goals of care discussion []   Labs   CBC: Recent Labs  Lab 01/12/24 1119 01/12/24 1200 01/12/24 1441 01/13/24 0512  WBC 7.5  --   --  6.3  NEUTROABS 5.8  --   --   --   HGB 11.7* 11.2* 10.2* 10.7*  HCT 32.5* 33.0* 30.0* 29.4*  MCV 81.7  --   --  82.1  PLT 177  --   --  179    Basic Metabolic Panel: Recent Labs  Lab 01/12/24 0951 01/12/24 1200 01/12/24 1341 01/12/24 1441 01/13/24 0512 01/13/24 0514 01/13/24 1608  NA 127*   < > 129* 131* 133* 133* 135  K 3.6   < > 3.7 3.8 3.4* 3.4* 3.2*  CL 91*  --  95*  --  100 100 101  CO2 17*  --  14*  --  17* 17* 22  GLUCOSE 328*  --  305*  --  264* 257* 155*  BUN 51*  --  51*  --  64* 63* 45*  CREATININE 8.69*  --  8.81*  --  10.14* 10.17* 6.98*  CALCIUM 8.6*  --  7.9*  --  7.1* 7.1* 7.4*  MG  --   --  2.0  --  2.4  --   --   PHOS  --   --   --   --   --  5.6* 2.9   < > = values in this interval not displayed.   GFR: Estimated Creatinine Clearance: 14.8 mL/min (A) (by C-G formula based on SCr of 6.98 mg/dL (H)). Recent Labs  Lab 01/12/24 0959 01/12/24 1119 01/12/24 1445 01/12/24 2049 01/13/24 0512  PROCALCITON  --   --   --  17.42  --   WBC  --  7.5  --   --  6.3  LATICACIDVEN 1.6  --  1.6  --   --     Liver Function Tests: Recent Labs  Lab 01/12/24 0951 01/12/24 1119 01/13/24 0512 01/13/24 0514 01/13/24 1608  AST 834* 830* 437*  --   --   ALT 263* 264* 167*  --   --   ALKPHOS 78 77 66  --   --   BILITOT 2.2* 2.1*  1.4*  --   --   PROT 7.5 7.5 5.9*  --   --   ALBUMIN 3.6 3.6 2.3* 2.3* 2.5*   Recent Labs  Lab 01/12/24 2049  LIPASE 163*   Recent Labs  Lab 01/13/24 0512  AMMONIA 46*    ABG    Component Value Date/Time   PHART 7.305 (L) 01/12/2024 1441   PCO2ART 30.9 (L) 01/12/2024 1441   PO2ART 76 (L) 01/12/2024 1441   HCO3 15.1 (L) 01/12/2024 1441   TCO2 16 (L) 01/12/2024 1441   ACIDBASEDEF 10.0 (H) 01/12/2024 1441   O2SAT 92 01/12/2024 1441  Coagulation Profile: Recent Labs  Lab 01/12/24 1119 01/13/24 0512  INR 1.1 1.2    Cardiac Enzymes: Recent Labs  Lab 01/12/24 2049 01/13/24 0512  CKTOTAL 13,277* 10,078*    HbA1C: Hgb A1c MFr Bld  Date/Time Value Ref Range Status  01/12/2024 08:49 PM 7.6 (H) 4.8 - 5.6 % Final    Comment:    (NOTE) Diagnosis of Diabetes The following HbA1c ranges recommended by the American Diabetes Association (ADA) may be used as an aid in the diagnosis of diabetes mellitus.  Hemoglobin             Suggested A1C NGSP%              Diagnosis  <5.7                   Non Diabetic  5.7-6.4                Pre-Diabetic  >6.4                   Diabetic  <7.0                   Glycemic control for                       adults with diabetes.      CBG: Recent Labs  Lab 01/13/24 1128 01/13/24 1552 01/13/24 1931 01/13/24 2315 01/14/24 0316  GLUCAP 185* 162* 135* 131* 167*    Review of Systems:   + cough  Past Medical History:  He,  has a past medical history of Diabetes mellitus without complication (HCC), Hyperlipidemia, and Hypertension.   Surgical History:  History reviewed. No pertinent surgical history.   Social History:   reports that he has never smoked. He has never used smokeless tobacco. He reports that he does not drink alcohol.   Family History:  His family history is not on file.   Allergies No Known Allergies   Home Medications  Prior to Admission medications   Medication Sig Start Date End Date Taking?  Authorizing Provider  amLODipine  (NORVASC ) 5 MG tablet Take 1 tablet (5 mg total) by mouth daily. 08/29/23  Yes Thapa, [place], MD  atorvastatin (LIPITOR) 80 MG tablet Take 80 mg by mouth daily.   Yes [provider]  doxazosin  (CARDURA ) 2 MG tablet Take 2 mg by mouth daily.   Yes [provider]  eplerenone  (INSPRA ) 50 MG tablet Take 1 tablet (50 mg total) by mouth daily. 08/28/23  Yes Thapa, [place], MD  metFORMIN (GLUCOPHAGE) 500 MG tablet Take 500 mg by mouth daily with breakfast.   Yes [provider]  azithromycin  (ZITHROMAX ) 250 MG tablet Take 1 tablet (250 mg total) by mouth daily. 07/15/14   Terryl Kubas, NP  chlorpheniramine-HYDROcodone [facility] PENNKINETIC ER) 10-8 MG/5ML LQCR Take 5 mLs by mouth 2 (two) times daily. 07/15/14   Terryl Kubas, NP  doxazosin  (CARDURA ) 1 MG tablet Take 2 tablets (2 mg total) by mouth daily. Patient not taking: Reported on 01/12/2024 08/28/23   Thapa, [place], MD     Critical care time: 30 minutes

## 2024-01-14 NOTE — Progress Notes (Signed)
 [facility] Progress Note  SUBJECTIVE:   Interval history: Adam Beasley was seen and evaluated today at bedside. Sleeping at time of my presentation. Awoke easily. Denied abdominal pain, chest pain, shortness of breath.   Past Medical History:  Diagnosis Date   Diabetes mellitus without complication (HCC)    Hyperlipidemia    Hypertension    History reviewed. No pertinent surgical history. Current Facility-Administered Medications  Medication Dose Route Frequency Provider Last Rate Last Admin   amLODipine  (NORVASC ) tablet 5 mg  5 mg Oral Daily Everhart, Kirstie, DO   5 mg at 01/14/24 0914   azithromycin  (ZITHROMAX ) tablet 500 mg  500 mg Oral Daily Everhart, Kirstie, DO   500 mg at 01/14/24 9085   cefTRIAXone  (ROCEPHIN ) 2 g in sodium chloride  0.9 % 100 mL IVPB  2 g Intravenous Q24H Opyd, Timothy S, MD   Stopped at 01/14/24 0908   Chlorhexidine  Gluconate Cloth 2 % PADS 6 each  6 each Topical Daily Kamat, Sunil G, MD   6 each at 01/13/24 1804   doxazosin  (CARDURA ) tablet 2 mg  2 mg Oral Daily Everhart, Kirstie, DO   2 mg at 01/14/24 0914   feeding supplement (ENSURE PLUS HIGH PROTEIN) liquid 237 mL  237 mL Oral TID BM Olalere, Adewale A, MD   237 mL at 01/13/24 1505   guaiFENesin  (ROBITUSSIN) 100 MG/5ML liquid 5 mL  5 mL Oral Q4H PRN Paliwal, Aditya, MD   5 mL at 01/14/24 9688   heparin  10,000 units/ 20 mL infusion syringe  500-1,000 Units/hr CRRT Continuous Geralynn Charleston, MD   Stopped at 01/14/24 9162   heparin  injection 1,000-6,000 Units  1,000-6,000 Units CRRT PRN Geralynn Charleston, MD       heparin  injection 5,000 Units  5,000 Units Subcutaneous Q8H Everhart, Kirstie, DO       hydrALAZINE  (APRESOLINE ) injection 10 mg  10 mg Intravenous Q6H PRN Patel, Ekta V, MD   10 mg at 01/14/24 0433   insulin  aspart (novoLOG ) injection 0-6 Units  0-6 Units Subcutaneous Q4H Tobie Mario GAILS, MD   1 Units at 01/14/24 9161   morphine  (PF) 2 MG/ML injection 2 mg  2 mg Intravenous Q4H PRN Patel,  Ekta V, MD   2 mg at 01/13/24 1246   multivitamin (RENA-VIT) tablet 1 tablet  1 tablet Oral QHS Olalere, Adewale A, MD   1 tablet at 01/13/24 2231   Oral care mouth rinse  15 mL Mouth Rinse PRN Kamat, Sunil G, MD       prismasol  BGK 4/2.5 infusion   CRRT Continuous Geralynn Charleston, MD 1,500 mL/hr at 01/14/24 1020 New Bag at 01/14/24 1020   prismasol  BGK 4/2.5 infusion   CRRT Continuous Geralynn Charleston, MD 400 mL/hr at 01/14/24 0438 New Bag at 01/14/24 0438   prismasol  BGK 4/2.5 infusion   CRRT Continuous Geralynn Charleston, MD 400 mL/hr at 01/14/24 0440 New Bag at 01/14/24 0440   Allergies as of 01/12/2024   (No Known Allergies)   Review of Systems:  Review of Systems  Respiratory:  Negative for shortness of breath.   Cardiovascular:  Negative for chest pain.  Gastrointestinal:  Negative for abdominal pain.    OBJECTIVE:   Temp:  [98.2 F (36.8 C)-103.1 F (39.5 C)] 100.9 F (38.3 C) (07/29 0759) Pulse Rate:  [83-98] 95 (07/29 1000) Resp:  [23-41] 24 (07/29 1000) BP: (118-174)/(76-111) 118/76 (07/29 1000) SpO2:  [89 %-96 %] 96 % (07/29 1000) Weight:  [110.4 kg] 110.4 kg (07/29 0443) Last  BM Date : 01/12/24 Physical Exam Constitutional:      General: He is not in acute distress.    Appearance: He is not ill-appearing, toxic-appearing or diaphoretic.  Cardiovascular:     Rate and Rhythm: Normal rate and regular rhythm.  Pulmonary:     Effort: No respiratory distress.     Breath sounds: Normal breath sounds.  Abdominal:     General: Bowel sounds are normal. There is no distension.     Palpations: Abdomen is soft.     Tenderness: There is no abdominal tenderness. There is no guarding.  Neurological:     Mental Status: He is alert.     Labs: Recent Labs    01/12/24 1119 01/12/24 1200 01/12/24 1441 01/13/24 0512 01/14/24 0914  WBC 7.5  --   --  6.3 7.2  HGB 11.7*   < > 10.2* 10.7* 11.3*  HCT 32.5*   < > 30.0* 29.4* 31.7*  PLT 177  --   --  179 201   < > = values in  this interval not displayed.   BMET Recent Labs    01/13/24 0514 01/13/24 1608 01/14/24 0500  NA 133* 135 132*  K 3.4* 3.2* 3.1*  CL 100 101 93*  CO2 17* 22 26  GLUCOSE 257* 155* 147*  BUN 63* 45* 35*  CREATININE 10.17* 6.98* 5.46*  CALCIUM 7.1* 7.4* 7.3*   LFT Recent Labs    01/14/24 0752  PROT 6.2*  ALBUMIN 2.4*  AST 269*  ALT 136*  ALKPHOS 61  BILITOT 1.6*  BILIDIR 0.6*  IBILI 1.0*   PT/INR Recent Labs    01/12/24 1119 01/13/24 0512  LABPROT 14.8 15.8*  INR 1.1 1.2   Diagnostic imaging: ECHOCARDIOGRAM COMPLETE Result Date: 01/13/2024    ECHOCARDIOGRAM REPORT   Patient Name:   [facility] Date of Exam: 01/13/2024 Medical Rec #:  [id number]       Height:       72.0 in Accession #:    [id number]      Weight:       241.2 lb Date of Birth:  12/13/1965       BSA:          2.307 m Patient Age:    58 years        BP:           144/90 mmHg Patient Gender: M               HR:           93 bpm. Exam Location:  Inpatient Procedure: 2D Echo, Color Doppler and Cardiac Doppler (Both Spectral and Color            Flow Doppler were utilized during procedure). Indications:    CHF- Acute Systolic I50.21  History:        Patient has no prior history of Echocardiogram examinations.                 Renal Failure; Risk Factors:Hypertension, Dyslipidemia and                 Diabetes.  Sonographer:    Koleen Popper RDCS Referring Phys: [id number] EKTA V PATEL IMPRESSIONS  1. Left ventricular ejection fraction, by estimation, is 65 to 70%. The left ventricle has normal function. The left ventricle has no regional wall motion abnormalities. There is severe concentric left ventricular hypertrophy. Left ventricular diastolic  parameters were normal.  2. Right ventricular systolic function is normal.  The right ventricular size is normal. Tricuspid regurgitation signal is inadequate for assessing PA pressure.  3. The mitral valve is grossly normal. Mild mitral valve regurgitation. No evidence of mitral  stenosis.  4. The aortic valve is tricuspid. Aortic valve regurgitation is not visualized. No aortic stenosis is present.  5. The inferior vena cava is normal in size with <50% respiratory variability, suggesting right atrial pressure of 8 mmHg. FINDINGS  Left Ventricle: Left ventricular ejection fraction, by estimation, is 65 to 70%. The left ventricle has normal function. The left ventricle has no regional wall motion abnormalities. The left ventricular internal cavity size was normal in size. There is  severe concentric left ventricular hypertrophy. Left ventricular diastolic parameters were normal. Right Ventricle: The right ventricular size is normal. No increase in right ventricular wall thickness. Right ventricular systolic function is normal. Tricuspid regurgitation signal is inadequate for assessing PA pressure. Left Atrium: Left atrial size was normal in size. Right Atrium: Right atrial size was normal in size. Pericardium: There is no evidence of pericardial effusion. Mitral Valve: The mitral valve is grossly normal. Mild mitral valve regurgitation. No evidence of mitral valve stenosis. Tricuspid Valve: The tricuspid valve is normal in structure. Tricuspid valve regurgitation is trivial. No evidence of tricuspid stenosis. Aortic Valve: The aortic valve is tricuspid. Aortic valve regurgitation is not visualized. No aortic stenosis is present. Pulmonic Valve: The pulmonic valve was normal in structure. Pulmonic valve regurgitation is not visualized. No evidence of pulmonic stenosis. Aorta: The aortic root is normal in size and structure. Venous: The inferior vena cava is normal in size with less than 50% respiratory variability, suggesting right atrial pressure of 8 mmHg. IAS/Shunts: The atrial septum is grossly normal.  LEFT VENTRICLE PLAX 2D LVIDd:         4.70 cm   Diastology LVIDs:         2.90 cm   LV e' medial:    8.05 cm/s LV PW:         1.60 cm   LV E/e' medial:  12.1 LV IVS:        1.60 cm   LV e'  lateral:   12.20 cm/s LVOT diam:     2.10 cm   LV E/e' lateral: 8.0 LV SV:         79 LV SV Index:   34 LVOT Area:     3.46 cm  RIGHT VENTRICLE             IVC RV S prime:     14.70 cm/s  IVC diam: 1.80 cm TAPSE (M-mode): 2.0 cm LEFT ATRIUM             Index        RIGHT ATRIUM           Index LA diam:        4.00 cm 1.73 cm/m   RA Area:     15.30 cm LA Vol (A2C):   36.5 ml 15.82 ml/m  RA Volume:   35.80 ml  15.52 ml/m LA Vol (A4C):   62.9 ml 27.26 ml/m LA Biplane Vol: 50.7 ml 21.98 ml/m  AORTIC VALVE LVOT Vmax:   138.00 cm/s LVOT Vmean:  94.600 cm/s LVOT VTI:    0.229 m  AORTA Ao Root diam: 3.10 cm Ao Asc diam:  3.40 cm MITRAL VALVE MV Area (PHT): 4.31 cm    SHUNTS MV Decel Time: 176 msec    Systemic VTI:  0.23 m MV  E velocity: 97.50 cm/s  Systemic Diam: 2.10 cm MV A velocity: 92.80 cm/s MV E/A ratio:  1.05 Soyla Merck MD Electronically signed by Soyla Merck MD Signature Date/Time: 01/13/2024/11:17:42 PM    Final    DG Chest Port 1 View Result Date: 01/13/2024 CLINICAL DATA:  Tunneled central venous catheter placement EXAM: PORTABLE CHEST 1 VIEW COMPARISON:  01/12/2024 FINDINGS: Left internal jugular temporary hemodialysis catheter tip seen overlying the expected brachiocephalic vein. No pneumothorax. Progressive consolidation within the left lung. Small left pleural effusion suspected. Right lung is clear. No pleural effusion on the right. Cardiac size within normal limits. No acute bone abnormality. IMPRESSION: 1. Left internal jugular temporary hemodialysis catheter tip within the brachiocephalic vein. No pneumothorax. 2. Progressive consolidation within the left lung. Associated small left pleural effusion. Electronically Signed   By: Dorethia Molt M.D.   On: 01/13/2024 03:39   CT HEAD WO CONTRAST ( ) Result Date: 01/13/2024 CLINICAL DATA:  Seizure, new-onset, no history of trauma EXAM: CT HEAD WITHOUT CONTRAST TECHNIQUE: Contiguous axial images were obtained from the base of the skull  through the vertex without intravenous contrast. RADIATION DOSE REDUCTION: This exam was performed according to the departmental dose-optimization program which includes automated exposure control, adjustment of the mA and/or kV according to patient size and/or use of iterative reconstruction technique. COMPARISON:  None Available. FINDINGS: Brain: No evidence of acute infarction, hemorrhage, hydrocephalus, extra-axial collection or mass lesion/mass effect. Vascular: No hyperdense vessel or unexpected calcification. Skull: Normal. Negative for fracture or focal lesion. Sinuses/Orbits: No acute finding. IMPRESSION: No evidence of acute abnormality. Electronically Signed   By: Gilmore GORMAN Molt M.D.   On: 01/13/2024 01:04   CT CHEST ABDOMEN PELVIS WO CONTRAST Result Date: 01/12/2024 CLINICAL DATA:  Sepsis EXAM: CT CHEST, ABDOMEN AND PELVIS WITHOUT CONTRAST TECHNIQUE: Multidetector CT imaging of the chest, abdomen and pelvis was performed following the standard protocol without IV contrast. RADIATION DOSE REDUCTION: This exam was performed according to the departmental dose-optimization program which includes automated exposure control, adjustment of the mA and/or kV according to patient size and/or use of iterative reconstruction technique. COMPARISON:  10/25/2023 FINDINGS: CT CHEST FINDINGS Cardiovascular: Mild cardiomegaly. Minimal atheromatous vascular calcification of the thoracic aorta. Mediastinum/Nodes: Likely reactive prevascular node 1.0 cm in short axis on image 20 series 301. Lungs/Pleura: New consolidation in a substantial portion of the left upper lobe and in the superior segment left lower lobe favoring multilobar pneumonia. Associated air bronchograms without mass effect on the associated airways. Small left pleural effusion. Musculoskeletal: Subacute healing left anterior eighth rib fracture on image 99 series 302, not present/perceptible on 10/25/2023. Thoracic spondylosis. Mild bilateral gynecomastia.  CT ABDOMEN PELVIS FINDINGS Hepatobiliary: Diffuse hepatic steatosis. No focal hepatic lesion identified on today's noncontrast exam. Pancreas: Unremarkable Spleen: Unremarkable Adrenals/Urinary Tract: Unremarkable Stomach/Bowel: Distal colonic and rectal air-levels favoring diarrheal process. No dilated bowel. Appendix poorly seen today. Vascular/Lymphatic: Unremarkable Reproductive: Unremarkable Other: No supplemental non-categorized findings. Musculoskeletal: Stable localized sclerosis along the iliac side of the left SI joint is probably related to arthropathy although technically nonspecific. No change from 10/25/2023. Mild lower lumbar spondylosis and degenerative disc disease. IMPRESSION: 1. New consolidation in a substantial portion of the left upper lobe and in the superior segment left lower lobe favoring multilobar pneumonia. Associated air bronchograms without mass effect on the associated airways. Small left pleural effusion. 2. Subacute healing left anterior eighth rib fracture, not present/perceptible on 10/25/2023. 3. Diffuse hepatic steatosis. 4. Distal colonic and rectal air-levels favoring diarrheal process. 5.  Mild cardiomegaly. 6. Stable localized sclerosis along the iliac side of the left SI joint is probably related to arthropathy although technically nonspecific. No change from 10/25/2023. 7. Mild lower lumbar spondylosis and degenerative disc disease. Electronically Signed   By: Ryan Salvage M.D.   On: 01/12/2024 12:16   IMPRESSION: Transaminase elevation in predominantly hepatocellular pattern, improved Acute kidney injury Rhabdomyolysis Herbal supplement use, Astaxanthin Hypertension Hyperlipidemia Diabetes mellitus  PLAN: -Transaminase levels continue to improve with supportive care, likely driven by rhabdomyolysis, driven less by drug induced liver injury (avoid Astaxanthin regardless) -Trend liver enzyme profile -[facility] will follow   LOS: 2 days   Estefana Keas,  [facility]

## 2024-01-15 DIAGNOSIS — E119 Type 2 diabetes mellitus without complications: Secondary | ICD-10-CM | POA: Diagnosis not present

## 2024-01-15 DIAGNOSIS — R748 Abnormal levels of other serum enzymes: Secondary | ICD-10-CM | POA: Diagnosis not present

## 2024-01-15 DIAGNOSIS — K7200 Acute and subacute hepatic failure without coma: Secondary | ICD-10-CM | POA: Diagnosis not present

## 2024-01-15 DIAGNOSIS — M6282 Rhabdomyolysis: Secondary | ICD-10-CM | POA: Diagnosis not present

## 2024-01-15 DIAGNOSIS — A419 Sepsis, unspecified organism: Secondary | ICD-10-CM | POA: Diagnosis not present

## 2024-01-15 DIAGNOSIS — R569 Unspecified convulsions: Secondary | ICD-10-CM | POA: Diagnosis not present

## 2024-01-15 DIAGNOSIS — N179 Acute kidney failure, unspecified: Secondary | ICD-10-CM | POA: Diagnosis not present

## 2024-01-15 LAB — GLUCOSE, CAPILLARY
Glucose-Capillary: 183 mg/dL — ABNORMAL HIGH (ref 70–99)
Glucose-Capillary: 206 mg/dL — ABNORMAL HIGH (ref 70–99)
Glucose-Capillary: 254 mg/dL — ABNORMAL HIGH (ref 70–99)
Glucose-Capillary: 267 mg/dL — ABNORMAL HIGH (ref 70–99)
Glucose-Capillary: 272 mg/dL — ABNORMAL HIGH (ref 70–99)
Glucose-Capillary: 276 mg/dL — ABNORMAL HIGH (ref 70–99)

## 2024-01-15 LAB — COMPREHENSIVE METABOLIC PANEL WITH GFR
ALT: 113 U/L — ABNORMAL HIGH (ref 0–44)
AST: 195 U/L — ABNORMAL HIGH (ref 15–41)
Albumin: 2.3 g/dL — ABNORMAL LOW (ref 3.5–5.0)
Alkaline Phosphatase: 61 U/L (ref 38–126)
Anion gap: 10 (ref 5–15)
BUN: 38 mg/dL — ABNORMAL HIGH (ref 6–20)
CO2: 22 mmol/L (ref 22–32)
Calcium: 7.8 mg/dL — ABNORMAL LOW (ref 8.9–10.3)
Chloride: 99 mmol/L (ref 98–111)
Creatinine, Ser: 5.83 mg/dL — ABNORMAL HIGH (ref 0.61–1.24)
GFR, Estimated: 11 mL/min — ABNORMAL LOW (ref >60)
Glucose, Bld: 215 mg/dL — ABNORMAL HIGH (ref 70–99)
Potassium: 3.4 mmol/L — ABNORMAL LOW (ref 3.5–5.1)
Sodium: 131 mmol/L — ABNORMAL LOW (ref 135–145)
Total Bilirubin: 1.4 mg/dL — ABNORMAL HIGH (ref 0.0–1.2)
Total Protein: 5.8 g/dL — ABNORMAL LOW (ref 6.5–8.1)

## 2024-01-15 LAB — CK: Total CK: 4013 U/L — ABNORMAL HIGH (ref 49–397)

## 2024-01-15 LAB — RENAL FUNCTION PANEL
Albumin: 2.3 g/dL — ABNORMAL LOW (ref 3.5–5.0)
Albumin: 2.2 g/dL — ABNORMAL LOW (ref 3.5–5.0)
Anion gap: 10 (ref 5–15)
Anion gap: 12 (ref 5–15)
BUN: 37 mg/dL — ABNORMAL HIGH (ref 6–20)
BUN: 50 mg/dL — ABNORMAL HIGH (ref 6–20)
CO2: 22 mmol/L (ref 22–32)
CO2: 22 mmol/L (ref 22–32)
Calcium: 7.9 mg/dL — ABNORMAL LOW (ref 8.9–10.3)
Calcium: 7.7 mg/dL — ABNORMAL LOW (ref 8.9–10.3)
Chloride: 99 mmol/L (ref 98–111)
Chloride: 96 mmol/L — ABNORMAL LOW (ref 98–111)
Creatinine, Ser: 5.74 mg/dL — ABNORMAL HIGH (ref 0.61–1.24)
Creatinine, Ser: 7.02 mg/dL — ABNORMAL HIGH (ref 0.61–1.24)
GFR, Estimated: 11 mL/min — ABNORMAL LOW (ref >60)
GFR, Estimated: 8 mL/min — ABNORMAL LOW (ref >60)
Glucose, Bld: 214 mg/dL — ABNORMAL HIGH (ref 70–99)
Glucose, Bld: 286 mg/dL — ABNORMAL HIGH (ref 70–99)
Phosphorus: 1.9 mg/dL — ABNORMAL LOW (ref 2.5–4.6)
Phosphorus: 1.9 mg/dL — ABNORMAL LOW (ref 2.5–4.6)
Potassium: 3.4 mmol/L — ABNORMAL LOW (ref 3.5–5.1)
Potassium: 3.4 mmol/L — ABNORMAL LOW (ref 3.5–5.1)
Sodium: 131 mmol/L — ABNORMAL LOW (ref 135–145)
Sodium: 130 mmol/L — ABNORMAL LOW (ref 135–145)

## 2024-01-15 LAB — MAGNESIUM: Magnesium: 2.4 mg/dL (ref 1.7–2.4)

## 2024-01-15 LAB — URINALYSIS, ROUTINE W REFLEX MICROSCOPIC
Bilirubin Urine: NEGATIVE
Glucose, UA: 50 mg/dL — AB
Ketones, ur: NEGATIVE mg/dL
Nitrite: NEGATIVE
Protein, ur: 30 mg/dL — AB
RBC / HPF: >50 RBC/hpf (ref 0–5)
Specific Gravity, Urine: 1.01 (ref 1.005–1.030)
pH: 5 (ref 5.0–8.0)

## 2024-01-15 LAB — CBC
HCT: 29.4 % — ABNORMAL LOW (ref 39.0–52.0)
Hemoglobin: 10.5 g/dL — ABNORMAL LOW (ref 13.0–17.0)
MCH: 29.9 pg (ref 26.0–34.0)
MCHC: 35.7 g/dL (ref 30.0–36.0)
MCV: 83.8 fL (ref 80.0–100.0)
Platelets: 238 K/uL (ref 150–400)
RBC: 3.51 MIL/uL — ABNORMAL LOW (ref 4.22–5.81)
RDW: 13.8 % (ref 11.5–15.5)
WBC: 6.3 K/uL (ref 4.0–10.5)
nRBC: 0 % (ref 0.0–0.2)

## 2024-01-15 LAB — APTT: aPTT: 43 s — ABNORMAL HIGH (ref 24–36)

## 2024-01-15 LAB — HEPATITIS B SURFACE ANTIGEN: Hepatitis B Surface Ag: NONREACTIVE

## 2024-01-15 LAB — HEPATITIS B SURFACE ANTIBODY,QUALITATIVE: Hep B S Ab: REACTIVE — AB

## 2024-01-15 LAB — HEPATITIS C ANTIBODY: HCV Ab: NONREACTIVE

## 2024-01-15 MED ORDER — INSULIN ASPART 100 UNIT/ML IJ SOLN
0.0000 [IU] | Freq: Three times a day (TID) | INTRAMUSCULAR | Status: DC
  Administered 2024-01-15 – 2024-01-16 (×4): 8 [IU] via SUBCUTANEOUS
  Administered 2024-01-16: 11 [IU] via SUBCUTANEOUS
  Administered 2024-01-17: 5 [IU] via SUBCUTANEOUS

## 2024-01-15 NOTE — Progress Notes (Addendum)
 NAME:  Adam Beasley, MRN:  [id number], DOB:  1966/03/17, LOS: 3 ADMISSION DATE:  01/12/2024  History of Present Illness:  58 yo male [facility] with c/o n/v/d x 4 days. Pt also reported sob and general fatigue at time of presentation. He denies documented fever/chills, no accompanying abdominal pain. Upon presentation pt was noted to actually have fever, was tachycardic and noted to have elevated liver enzymes with normal lactate as well as in acute renal failure with BUN/Cr ~50/>8. He was subsequently transferred to [facility] and admitted to [facility] with nephrology consult.    Sepsis protocol was initiated for the pt, started on broad spectrum abx and volume resuscitation. CT chest abd/pelvis revealed new consolidation on L lung, stigmata of diarrheal process with inflammation and air levels of the distal colon. Unfortunately, shortly after transfer to [facility] pt began having generalized tonic clonic seizure like activity with post ictal ~1min long (unresponsive). RRT called and pt was given 2 mg ativan  which ceased activity. He reportedly had some amount of emesis and subsequently had diminished BS with need for supplemental oxygen.    CCM was asked to transfer to ICU for initiation of line and CRRT as despite the BUN only reporting ~50, thought was that his uremia/aki was resulting in sz like activity. CTH pending, vas cath placement pending and cefepime appropriately stopped after event.    Pt reportedly was in his normal state of health prior to his seemingly GI illness ~4-7 days ago. He additionally was having decreased PO intake during this time. No other complaints offered and pt is unable to coherently discuss HPI/ROS, but is protecting airway. They have consented to transfer to ICU and vas cath placement.  Pertinent  Medical History  HTN Hyperlipidemia DM2 with hyperglycemia Suspected OSA  Significant Hospital Events: Including procedures, antibiotic start and stop dates in  addition to other pertinent events   7/27 - Presented to [facility], transferred to [facility] for admission, later transferred to ICU for initiation of crrt.  7/29 - CRRT discontinued  Interim History / Subjective:  No complaints this morning   Objective    Blood pressure (!) 147/96, pulse 100, temperature 99.7 F (37.6 C), temperature source Oral, resp. rate (!) 23, height 6' (1.829 m), weight 110.4 kg, SpO2 93%.        Intake/Output Summary (Last 24 hours) at 01/15/2024 0903 Last data filed at 01/15/2024 0700 Gross per 24 hour  Intake 1601.98 ml  Output 1580 ml  Net 21.98 ml   Filed Weights   01/12/24 1621 01/13/24 0500 01/14/24 0443  Weight: 110.1 kg 109.4 kg 110.4 kg    Examination: General: no acute distress Cardiovascular: RRR, no m/r/g Respiratory: normal work of breathing on 4L [place], diminished breath sounds left lower lung field, no wheezes Abdomen: Normal bowel sounds, soft, non-tender Extremities: No swelling BLE  Resolved problem list  New onset seizure - suspected 2/2 metabolic derangement vs uremia  High AG metabolic acidosis Vomiting/diarrhea Assessment and Plan  Acute renal failure Rhabdomyolysis Unclear etiology, consider volume depletion in setting of recent illness and rhabdomyolysis contributing. CK improving. Still with minimal urine output. Urine culture with no growth. CRRT stopped 7/29.  -Nephrology following -Monitor RFP, UOP -Consider renal biopsy   Acute hypoxic respiratory failure CT chest concerning for multilobar pneumonia, possible aspiration during seizure  Blood cultures no growth at 2 days. Still with oxygen requirement. RPP negative.  -Continue Ceftriaxone  (7/28-), Azithromycin  (7/28-) -Considered tracheal aspirate but he does not have sputum  production  -Wean O2 as able -Aspiration precautions  -F/u legionella, strep pneumoniae  -PT eval   Hx HTN, HLD Restarted home amlodipine , doxazosin  -Holding eplerenone  due to creatinine  clearance -Holding home lipitor  -PRN hydralazine  for SBP >160   Transaminitis LFTs continue to improve. RUQ US  normal.  -CTM LFTs -Can have tylenol  no more than 3g/day   T2DM with hyperglycemia -Holding home metformin -SSI moderate   Suspected OSA -Apneic periods during breathing noted during hospitalization -Consider OP sleep study     Best Practice (right click and Reselect all SmartList Selections daily)   Diet/type: Regular consistency (see orders) DVT prophylaxis prophylactic heparin   Pressure ulcer(s): N/A GI prophylaxis: N/A Lines: Dialysis Catheter Foley:  Yes, and it is still needed Code Status:  full code Last date of multidisciplinary goals of care discussion []   Labs   CBC: Recent Labs  Lab 01/12/24 1119 01/12/24 1200 01/12/24 1441 01/13/24 0512 01/14/24 0914 01/15/24 0426  WBC 7.5  --   --  6.3 7.2 6.3  NEUTROABS 5.8  --   --   --   --   --   HGB 11.7* 11.2* 10.2* 10.7* 11.3* 10.5*  HCT 32.5* 33.0* 30.0* 29.4* 31.7* 29.4*  MCV 81.7  --   --  82.1 83.4 83.8  PLT 177  --   --  179 201 238    Basic Metabolic Panel: Recent Labs  Lab 01/12/24 1341 01/12/24 1441 01/13/24 0512 01/13/24 0514 01/13/24 1608 01/14/24 0500 01/14/24 0753 01/14/24 1614 01/15/24 0426  NA 129*   < > 133* 133* 135 132*  --  133* 131*  131*  K 3.7   < > 3.4* 3.4* 3.2* 3.1*  --  3.6 3.4*  3.4*  CL 95*  --  100 100 101 93*  --  97* 99  99  CO2 14*  --  17* 17* 22 26  --  24 22  22   GLUCOSE 305*  --  264* 257* 155* 147*  --  238* 215*  214*  BUN 51*  --  64* 63* 45* 35*  --  31* 38*  37*  CREATININE 8.81*  --  10.14* 10.17* 6.98* 5.46*  --  4.99* 5.83*  5.74*  CALCIUM 7.9*  --  7.1* 7.1* 7.4* 7.3*  --  7.5* 7.8*  7.9*  MG 2.0  --  2.4  --   --   --  2.4  --  2.4  PHOS  --   --   --  5.6* 2.9 2.7  --  3.0 1.9*   < > = values in this interval not displayed.   GFR: Estimated Creatinine Clearance: 18 mL/min (A) (by C-G formula based on SCr of 5.74 mg/dL  (H)). Recent Labs  Lab 01/12/24 0959 01/12/24 1119 01/12/24 1445 01/12/24 2049 01/13/24 0512 01/14/24 0914 01/15/24 0426  PROCALCITON  --   --   --  17.42  --   --   --   WBC  --  7.5  --   --  6.3 7.2 6.3  LATICACIDVEN 1.6  --  1.6  --   --   --   --     Liver Function Tests: Recent Labs  Lab 01/12/24 0951 01/12/24 1119 01/13/24 0512 01/13/24 0514 01/13/24 1608 01/14/24 0500 01/14/24 0752 01/14/24 1614 01/15/24 0426  AST 834* 830* 437*  --   --   --  269*  --  195*  ALT 263* 264* 167*  --   --   --  136*  --  113*  ALKPHOS 78 77 66  --   --   --  61  --  61  BILITOT 2.2* 2.1* 1.4*  --   --   --  1.6*  --  1.4*  PROT 7.5 7.5 5.9*  --   --   --  6.2*  --  5.8*  ALBUMIN 3.6 3.6 2.3*   < > 2.5* 2.3* 2.4* 2.4* 2.3*  2.3*   < > = values in this interval not displayed.   Recent Labs  Lab 01/12/24 2049  LIPASE 163*   Recent Labs  Lab 01/13/24 0512  AMMONIA 46*    ABG    Component Value Date/Time   PHART 7.305 (L) 01/12/2024 1441   PCO2ART 30.9 (L) 01/12/2024 1441   PO2ART 76 (L) 01/12/2024 1441   HCO3 15.1 (L) 01/12/2024 1441   TCO2 16 (L) 01/12/2024 1441   ACIDBASEDEF 10.0 (H) 01/12/2024 1441   O2SAT 92 01/12/2024 1441     Coagulation Profile: Recent Labs  Lab 01/12/24 1119 01/13/24 0512  INR 1.1 1.2    Cardiac Enzymes: Recent Labs  Lab 01/12/24 2049 01/13/24 0512 01/14/24 0752 01/15/24 0426  CKTOTAL 13,277* 10,078* 3,761* 4,013*    HbA1C: Hgb A1c MFr Bld  Date/Time Value Ref Range Status  01/12/2024 08:49 PM 7.6 (H) 4.8 - 5.6 % Final    Comment:    (NOTE) Diagnosis of Diabetes The following HbA1c ranges recommended by the American Diabetes Association (ADA) may be used as an aid in the diagnosis of diabetes mellitus.  Hemoglobin             Suggested A1C NGSP%              Diagnosis  <5.7                   Non Diabetic  5.7-6.4                Pre-Diabetic  >6.4                   Diabetic  <7.0                   Glycemic  control for                       adults with diabetes.      CBG: Recent Labs  Lab 01/14/24 1138 01/14/24 1530 01/14/24 2324 01/15/24 0318 01/15/24 0743  GLUCAP 139* 220* 212* 206* 183*    Review of Systems:   N/a  Past Medical History:  He,  has a past medical history of Diabetes mellitus without complication (HCC), Hyperlipidemia, and Hypertension.   Surgical History:  History reviewed. No pertinent surgical history.   Social History:   reports that he has never smoked. He has never used smokeless tobacco. He reports that he does not drink alcohol.   Family History:  His family history is not on file.   Allergies No Known Allergies   Home Medications  Prior to Admission medications   Medication Sig Start Date End Date Taking? Authorizing Provider  amLODipine  (NORVASC ) 5 MG tablet Take 1 tablet (5 mg total) by mouth daily. 08/29/23  Yes Thapa, [place], MD  atorvastatin (LIPITOR) 80 MG tablet Take 80 mg by mouth daily.   Yes [provider]  doxazosin  (CARDURA ) 2 MG tablet Take 2 mg by mouth daily.   Yes [provider]  eplerenone  (INSPRA ) 50 MG tablet Take 1 tablet (50 mg total) by mouth daily. 08/28/23  Yes Thapa, [place], MD  metFORMIN (GLUCOPHAGE) 500 MG tablet Take 500 mg by mouth daily with breakfast.   Yes [provider]  azithromycin  (ZITHROMAX ) 250 MG tablet Take 1 tablet (250 mg total) by mouth daily. 07/15/14   Terryl Kubas, NP  chlorpheniramine-HYDROcodone [facility] PENNKINETIC ER) 10-8 MG/5ML LQCR Take 5 mLs by mouth 2 (two) times daily. 07/15/14   Terryl Kubas, NP  doxazosin  (CARDURA ) 1 MG tablet Take 2 tablets (2 mg total) by mouth daily. Patient not taking: Reported on 01/12/2024 08/28/23   Thapa, [place], MD     Critical care time: 30 minutes

## 2024-01-15 NOTE — Progress Notes (Signed)
 [facility] Progress Note  SUBJECTIVE:   Interval history: Adam Beasley was seen and evaluated today at bedside. Sleeping with spouse at bedside. She noted that he has not been complaining of any pain. Liver enzyme trend noted.   Past Medical History:  Diagnosis Date  . Diabetes mellitus without complication (HCC)   . Hyperlipidemia   . Hypertension    History reviewed. No pertinent surgical history. Current Facility-Administered Medications  Medication Dose Route Frequency Provider Last Rate Last Admin  . amLODipine  (NORVASC ) tablet 10 mg  10 mg Oral Daily Olalere, Adewale A, MD   10 mg at 01/15/24 0919  . azithromycin  (ZITHROMAX ) tablet 500 mg  500 mg Oral Daily Everhart, Kirstie, DO   500 mg at 01/15/24 0920  . cefTRIAXone  (ROCEPHIN ) 2 g in sodium chloride  0.9 % 100 mL IVPB  2 g Intravenous Q24H Opyd, Evalene RAMAN, MD   Stopped at 01/15/24 9046  . Chlorhexidine  Gluconate Cloth 2 % PADS 6 each  6 each Topical Daily Kamat, Sunil G, MD   6 each at 01/14/24 1232  . doxazosin  (CARDURA ) tablet 2 mg  2 mg Oral Daily Everhart, Kirstie, DO   2 mg at 01/15/24 0920  . feeding supplement (ENSURE PLUS HIGH PROTEIN) liquid 237 mL  237 mL Oral TID BM Olalere, Adewale A, MD   237 mL at 01/15/24 0920  . guaiFENesin  (ROBITUSSIN) 100 MG/5ML liquid 5 mL  5 mL Oral Q4H PRN Paliwal, Aditya, MD   5 mL at 01/14/24 0311  . heparin  injection 1,000-6,000 Units  1,000-6,000 Units CRRT PRN Geralynn Charleston, MD   3,000 Units at 01/14/24 2342  . heparin  injection 5,000 Units  5,000 Units Subcutaneous Q8H Everhart, Kirstie, DO   5,000 Units at 01/15/24 9361  . hydrALAZINE  (APRESOLINE ) injection 20 mg  20 mg Intravenous Q4H PRN Olalere, Adewale A, MD      . insulin  aspart (novoLOG ) injection 0-15 Units  0-15 Units Subcutaneous TID WC Everhart, Kirstie, DO   8 Units at 01/15/24 1215  . morphine  (PF) 2 MG/ML injection 2 mg  2 mg Intravenous Q4H PRN Patel, Ekta V, MD   2 mg at 01/13/24 1246  . multivitamin  (RENA-VIT) tablet 1 tablet  1 tablet Oral QHS Olalere, Adewale A, MD   1 tablet at 01/14/24 2218  . Oral care mouth rinse  15 mL Mouth Rinse PRN Kamat, Sunil G, MD       Allergies as of 01/12/2024  . (No Known Allergies)   Review of Systems:  Review of Systems  Reason unable to perform ROS: Patient sleeping.    OBJECTIVE:   Temp:  [98.7 F (37.1 C)-100.7 F (38.2 C)] 99.8 F (37.7 C) (07/30 1145) Pulse Rate:  [76-116] 107 (07/30 1000) Resp:  [17-40] 25 (07/30 1000) BP: (129-170)/(75-122) 147/86 (07/30 1000) SpO2:  [88 %-98 %] 95 % (07/30 1000) Last BM Date : 01/15/24 Physical Exam Constitutional:      General: He is not in acute distress.    Appearance: He is not ill-appearing, toxic-appearing or diaphoretic.  Cardiovascular:     Comments: Regular pulse Abdominal:     General: There is no distension.     Palpations: Abdomen is soft.     Tenderness: There is no abdominal tenderness.     Labs: Recent Labs    01/13/24 0512 01/14/24 0914 01/15/24 0426  WBC 6.3 7.2 6.3  HGB 10.7* 11.3* 10.5*  HCT 29.4* 31.7* 29.4*  PLT 179 201 238   BMET Recent  Labs    01/14/24 0500 01/14/24 1614 01/15/24 0426  NA 132* 133* 131*  131*  K 3.1* 3.6 3.4*  3.4*  CL 93* 97* 99  99  CO2 26 24 22  22   GLUCOSE 147* 238* 215*  214*  BUN 35* 31* 38*  37*  CREATININE 5.46* 4.99* 5.83*  5.74*  CALCIUM 7.3* 7.5* 7.8*  7.9*   LFT Recent Labs    01/14/24 0752 01/14/24 1614 01/15/24 0426  PROT 6.2*  --  5.8*  ALBUMIN 2.4*   < > 2.3*  2.3*  AST 269*  --  195*  ALT 136*  --  113*  ALKPHOS 61  --  61  BILITOT 1.6*  --  1.4*  BILIDIR 0.6*  --   --   IBILI 1.0*  --   --    < > = values in this interval not displayed.   PT/INR Recent Labs    01/13/24 0512  LABPROT 15.8*  INR 1.2   Diagnostic imaging: US  Abdomen Limited RUQ (LIVER/GB) Result Date: 01/14/2024 CLINICAL DATA:  [id number] LFT elevation 602230. EXAM: ULTRASOUND ABDOMEN LIMITED RIGHT UPPER QUADRANT COMPARISON:   CT scan abdomen and pelvis from 01/12/2024. FINDINGS: Gallbladder: The gallbladder is physiologically distended. Small amount of sludge noted. No gallstones or wall thickening visualized. No sonographic Murphy sign noted by sonographer. Common bile duct: Diameter: Up to 4 mm.  No intrahepatic bile duct dilation. Liver: No focal lesion identified. Within normal limits in parenchymal echogenicity. Portal vein is patent on color Doppler imaging with normal direction of blood flow towards the liver. Other: None. IMPRESSION: *Essentially unremarkable right upper quadrant ultrasound examination. Electronically Signed   By: Ree Molt M.D.   On: 01/14/2024 15:54   ECHOCARDIOGRAM COMPLETE Result Date: 01/13/2024    ECHOCARDIOGRAM REPORT   Patient Name:   [name] Date of Exam: 01/13/2024 Medical Rec #:  [id number]       Height:       72.0 in Accession #:    [id number]      Weight:       241.2 lb Date of Birth:  11-Nov-1965       BSA:          2.307 m Patient Age:    58 years        BP:           144/90 mmHg Patient Gender: M               HR:           93 bpm. Exam Location:  Inpatient Procedure: 2D Echo, Color Doppler and Cardiac Doppler (Both Spectral and Color            Flow Doppler were utilized during procedure). Indications:    CHF- Acute Systolic I50.21  History:        Patient has no prior history of Echocardiogram examinations.                 Renal Failure; Risk Factors:Hypertension, Dyslipidemia and                 Diabetes.  Sonographer:    Koleen Popper RDCS Referring Phys: [phone] EKTA V PATEL IMPRESSIONS  1. Left ventricular ejection fraction, by estimation, is 65 to 70%. The left ventricle has normal function. The left ventricle has no regional wall motion abnormalities. There is severe concentric left ventricular hypertrophy. Left ventricular diastolic  parameters were normal.  2. Right ventricular systolic function  is normal. The right ventricular size is normal. Tricuspid regurgitation signal  is inadequate for assessing PA pressure.  3. The mitral valve is grossly normal. Mild mitral valve regurgitation. No evidence of mitral stenosis.  4. The aortic valve is tricuspid. Aortic valve regurgitation is not visualized. No aortic stenosis is present.  5. The inferior vena cava is normal in size with <50% respiratory variability, suggesting right atrial pressure of 8 mmHg. FINDINGS  Left Ventricle: Left ventricular ejection fraction, by estimation, is 65 to 70%. The left ventricle has normal function. The left ventricle has no regional wall motion abnormalities. The left ventricular internal cavity size was normal in size. There is  severe concentric left ventricular hypertrophy. Left ventricular diastolic parameters were normal. Right Ventricle: The right ventricular size is normal. No increase in right ventricular wall thickness. Right ventricular systolic function is normal. Tricuspid regurgitation signal is inadequate for assessing PA pressure. Left Atrium: Left atrial size was normal in size. Right Atrium: Right atrial size was normal in size. Pericardium: There is no evidence of pericardial effusion. Mitral Valve: The mitral valve is grossly normal. Mild mitral valve regurgitation. No evidence of mitral valve stenosis. Tricuspid Valve: The tricuspid valve is normal in structure. Tricuspid valve regurgitation is trivial. No evidence of tricuspid stenosis. Aortic Valve: The aortic valve is tricuspid. Aortic valve regurgitation is not visualized. No aortic stenosis is present. Pulmonic Valve: The pulmonic valve was normal in structure. Pulmonic valve regurgitation is not visualized. No evidence of pulmonic stenosis. Aorta: The aortic root is normal in size and structure. Venous: The inferior vena cava is normal in size with less than 50% respiratory variability, suggesting right atrial pressure of 8 mmHg. IAS/Shunts: The atrial septum is grossly normal.  LEFT VENTRICLE PLAX 2D LVIDd:         4.70 cm    Diastology LVIDs:         2.90 cm   LV e' medial:    8.05 cm/s LV PW:         1.60 cm   LV E/e' medial:  12.1 LV IVS:        1.60 cm   LV e' lateral:   12.20 cm/s LVOT diam:     2.10 cm   LV E/e' lateral: 8.0 LV SV:         79 LV SV Index:   34 LVOT Area:     3.46 cm  RIGHT VENTRICLE             IVC RV S prime:     14.70 cm/s  IVC diam: 1.80 cm TAPSE (M-mode): 2.0 cm LEFT ATRIUM             Index        RIGHT ATRIUM           Index LA diam:        4.00 cm 1.73 cm/m   RA Area:     15.30 cm LA Vol (A2C):   36.5 ml 15.82 ml/m  RA Volume:   35.80 ml  15.52 ml/m LA Vol (A4C):   62.9 ml 27.26 ml/m LA Biplane Vol: 50.7 ml 21.98 ml/m  AORTIC VALVE LVOT Vmax:   138.00 cm/s LVOT Vmean:  94.600 cm/s LVOT VTI:    0.229 m  AORTA Ao Root diam: 3.10 cm Ao Asc diam:  3.40 cm MITRAL VALVE MV Area (PHT): 4.31 cm    SHUNTS MV Decel Time: 176 msec    Systemic VTI:  0.23  m MV E velocity: 97.50 cm/s  Systemic Diam: 2.10 cm MV A velocity: 92.80 cm/s MV E/A ratio:  1.05 Soyla Merck MD Electronically signed by Soyla Merck MD Signature Date/Time: 01/13/2024/11:17:42 PM    Final    IMPRESSION: Transaminase elevation in predominantly hepatocellular pattern  -Driven by rhabdomyolysis and drug induced liver injury Acute kidney injury Rhabdomyolysis Herbal supplement use, Astaxanthin Hypertension Hyperlipidemia Diabetes mellitus  PLAN: -Continue to trend liver enzymes -Can follow up with [facility] in outpatient setting  -[facility] will sign off and be available for any questions    LOS: 3 days   Estefana Keas, [facility]

## 2024-01-15 NOTE — Inpatient Diabetes Management (Signed)
 Inpatient Diabetes Program Recommendations  AACE/ADA: New Consensus Statement on Inpatient Glycemic Control (2025)  Target Ranges:  Prepandial:   less than 140 mg/dL      Peak postprandial:   less than 180 mg/dL (1-2 hours)      Critically ill patients:  140 - 180 mg/dL   Lab Results  Component Value Date   GLUCAP 272 (H) 01/15/2024   HGBA1C 7.6 (H) 01/12/2024    Review of Glycemic Control  Latest Reference Range & Units 01/14/24 15:30 01/14/24 23:24 01/15/24 03:18 01/15/24 07:43 01/15/24 11:47  Glucose-Capillary 70 - 99 mg/dL 779 (H) 787 (H) 793 (H) 183 (H) 272 (H)   Diabetes history: DM  Outpatient Diabetes medications:  Metformin 500 mg daily Current orders for Inpatient glycemic control: Novolog  0-15 units tid with meals  Inpatient Diabetes Program Recommendations:   May consider adding Semglee  8 units daily?   Thanks,  Randall Bullocks, RN, BC-ADM Inpatient Diabetes Coordinator Pager [phone]  (8a-5p)

## 2024-01-15 NOTE — Progress Notes (Signed)
 Adam Beasley is an 58 y.o. male w/ PMH as below who presented early 7/27 morning c/o cough, diarrhea and N/V for the last 4 days. Also SOB and fatigue. In the ED temp 103, RR 20, HR 121, BP 160/90. Wall 2 L at 93%.  Labs showed Na 130, K 3.6, CO2 14, bun 51, creat 8.81 (0.9 in may 2025) with hosp course complicated by seizures, CT showed new consolidation in LUL and LLL.  Assessment/Plan: AKI, severe: w/ normal b/l creat 0.9- 1.0 from jan and may 2025, eGFR 96 ml/min. Creat here is 8.6 in the setting of an acute resp illness x 1 week, w/ multifocal PNA most likely per CT. No acei/ ARB at home, BP's stable here. No nephrotoxins. On exam looks euvolemic, possibly a bit dry. CT showed bilat kidneys w/o obstruction. UA showed ^protein but no rbcs/ wbcs. Patient just had a significant seizure episode, this could possibly due to uremia. Not sure cause of AKI, vol depletion probably played a role initially but likely ATN from rhabdo (CPK 10k). Due to the seizures, which could be uremia-related, pt started on CRRT 7/27-29.  - Renal function continues to go the wrong direction but more urine output since CRRT stopped on 7/29 evening. - No absolute indications for intermittent HD today and will hold for another 24 to 48 hours.  Hopefully we will see increased urine output over the next few days and would like to hold HD unless absolutely necessary to give him the best chance of recovery.    Will follow closely with you.  -Monitor Daily I/Os, Daily weight  -Maintain MAP>65 for optimal renal perfusion.  - Avoid nephrotoxic agents such as IV contrast, NSAIDs, and phosphate containing bowel preps (FLEETS)  Multifocal PNA: per CT, on IV abx per pmd Acute seizures: tonight, RR rx'd w/ IV ativan .   Subjective: Spouse bedside; appetite better, dark urine in the foley   Chemistry and CBC: Creat  Date/Time Value Ref Range Status  07/16/2023 03:15 PM 0.93 0.70 - 1.30 mg/dL Final  87/97/7975 96:47 PM 0.95 0.70 -  1.30 mg/dL Final   Creatinine, Ser  Date/Time Value Ref Range Status  01/15/2024 04:26 AM 5.74 (H) 0.61 - 1.24 mg/dL Final  92/69/7974 95:73 AM 5.83 (H) 0.61 - 1.24 mg/dL Final  92/70/7974 95:85 PM 4.99 (H) 0.61 - 1.24 mg/dL Final  92/70/7974 94:99 AM 5.46 (H) 0.61 - 1.24 mg/dL Final  92/71/7974 95:91 PM 6.98 (H) 0.61 - 1.24 mg/dL Final  92/71/7974 94:85 AM 10.17 (H) 0.61 - 1.24 mg/dL Final  92/71/7974 94:87 AM 10.14 (H) 0.61 - 1.24 mg/dL Final  92/72/7974 98:58 PM 8.81 (H) 0.61 - 1.24 mg/dL Final  92/72/7974 90:48 AM 8.69 (H) 0.61 - 1.24 mg/dL Final  94/90/7974 97:80 PM 1.06 0.61 - 1.24 mg/dL Final  98/72/7983 88:59 PM 1.04 0.50 - 1.35 mg/dL Final   Recent Labs  Lab 01/12/24 1341 01/12/24 1441 01/13/24 0512 01/13/24 0514 01/13/24 1608 01/14/24 0500 01/14/24 1614 01/15/24 0426  NA 129* 131* 133* 133* 135 132* 133* 131*  131*  K 3.7 3.8 3.4* 3.4* 3.2* 3.1* 3.6 3.4*  3.4*  CL 95*  --  100 100 101 93* 97* 99  99  CO2 14*  --  17* 17* 22 26 24 22  22   GLUCOSE 305*  --  264* 257* 155* 147* 238* 215*  214*  BUN 51*  --  64* 63* 45* 35* 31* 38*  37*  CREATININE 8.81*  --  10.14* 10.17* 6.98*  5.46* 4.99* 5.83*  5.74*  CALCIUM 7.9*  --  7.1* 7.1* 7.4* 7.3* 7.5* 7.8*  7.9*  PHOS  --   --   --  5.6* 2.9 2.7 3.0 1.9*   Recent Labs  Lab 01/12/24 1119 01/12/24 1200 01/12/24 1441 01/13/24 0512 01/14/24 0914 01/15/24 0426  WBC 7.5  --   --  6.3 7.2 6.3  NEUTROABS 5.8  --   --   --   --   --   HGB 11.7*   < > 10.2* 10.7* 11.3* 10.5*  HCT 32.5*   < > 30.0* 29.4* 31.7* 29.4*  MCV 81.7  --   --  82.1 83.4 83.8  PLT 177  --   --  179 201 238   < > = values in this interval not displayed.   Liver Function Tests: Recent Labs  Lab 01/13/24 0512 01/13/24 0514 01/14/24 0752 01/14/24 1614 01/15/24 0426  AST 437*  --  269*  --  195*  ALT 167*  --  136*  --  113*  ALKPHOS 66  --  61  --  61  BILITOT 1.4*  --  1.6*  --  1.4*  PROT 5.9*  --  6.2*  --  5.8*  ALBUMIN 2.3*   <  > 2.4* 2.4* 2.3*  2.3*   < > = values in this interval not displayed.   Recent Labs  Lab 01/12/24 2049  LIPASE 163*   Recent Labs  Lab 01/13/24 0512  AMMONIA 46*   Cardiac Enzymes: Recent Labs  Lab 01/12/24 2049 01/13/24 0512 01/14/24 0752 01/15/24 0426  CKTOTAL 13,277* 10,078* 3,761* 4,013*   Iron Studies: No results for input(s): IRON, TIBC, TRANSFERRIN, FERRITIN in the last 72 hours. PT/INR: @LABRCNTIP (inr:5)  Xrays/Other Studies: ) Results for orders placed or performed during the hospital encounter of 01/12/24 (from the past 48 hours)  Glucose, capillary     Status: Abnormal   Collection Time: 01/13/24 11:28 AM  Result Value Ref Range   Glucose-Capillary 185 (H) 70 - 99 mg/dL    Comment: Glucose reference range applies only to samples taken after fasting for at least 8 hours.  Glucose, capillary     Status: Abnormal   Collection Time: 01/13/24  3:52 PM  Result Value Ref Range   Glucose-Capillary 162 (H) 70 - 99 mg/dL    Comment: Glucose reference range applies only to samples taken after fasting for at least 8 hours.  Renal function panel (daily at 1600)     Status: Abnormal   Collection Time: 01/13/24  4:08 PM  Result Value Ref Range   Sodium 135 135 - 145 mmol/L   Potassium 3.2 (L) 3.5 - 5.1 mmol/L   Chloride 101 98 - 111 mmol/L   CO2 22 22 - 32 mmol/L   Glucose, Bld 155 (H) 70 - 99 mg/dL    Comment: Glucose reference range applies only to samples taken after fasting for at least 8 hours.   BUN 45 (H) 6 - 20 mg/dL   Creatinine, Ser 3.01 (H) 0.61 - 1.24 mg/dL   Calcium 7.4 (L) 8.9 - 10.3 mg/dL   Phosphorus 2.9 2.5 - 4.6 mg/dL   Albumin 2.5 (L) 3.5 - 5.0 g/dL   GFR, Estimated 8 (L) >60 mL/min    Comment: (NOTE) Calculated using the CKD-EPI Creatinine Equation (2021)    Anion gap 12 5 - 15    Comment: Performed at [facility], [address] [place], [place] [address]  Glucose, capillary     Status: Abnormal   Collection Time: 01/13/24   7:31 PM  Result Value Ref Range   Glucose-Capillary 135 (H) 70 - 99 mg/dL    Comment: Glucose reference range applies only to samples taken after fasting for at least 8 hours.  Glucose, capillary     Status: Abnormal   Collection Time: 01/13/24 11:15 PM  Result Value Ref Range   Glucose-Capillary 131 (H) 70 - 99 mg/dL    Comment: Glucose reference range applies only to samples taken after fasting for at least 8 hours.  Glucose, capillary     Status: Abnormal   Collection Time: 01/14/24  3:16 AM  Result Value Ref Range   Glucose-Capillary 167 (H) 70 - 99 mg/dL    Comment: Glucose reference range applies only to samples taken after fasting for at least 8 hours.  Renal function panel (daily at 0500)     Status: Abnormal   Collection Time: 01/14/24  5:00 AM  Result Value Ref Range   Sodium 132 (L) 135 - 145 mmol/L   Potassium 3.1 (L) 3.5 - 5.1 mmol/L   Chloride 93 (L) 98 - 111 mmol/L   CO2 26 22 - 32 mmol/L   Glucose, Bld 147 (H) 70 - 99 mg/dL    Comment: Glucose reference range applies only to samples taken after fasting for at least 8 hours.   BUN 35 (H) 6 - 20 mg/dL   Creatinine, Ser 4.53 (H) 0.61 - 1.24 mg/dL   Calcium 7.3 (L) 8.9 - 10.3 mg/dL   Phosphorus 2.7 2.5 - 4.6 mg/dL   Albumin 2.3 (L) 3.5 - 5.0 g/dL   GFR, Estimated 11 (L) >60 mL/min    Comment: (NOTE) Calculated using the CKD-EPI Creatinine Equation (2021)    Anion gap 13 5 - 15    Comment: Performed at [facility], [address] [place], [place] [address]  CK     Status: Abnormal   Collection Time: 01/14/24  7:52 AM  Result Value Ref Range   Total CK 3,761 (H) 49 - 397 U/L    Comment: RESULT CONFIRMED BY MANUAL DILUTION Performed at [facility], [address], [place], [place] [address]   Hepatic function panel     Status: Abnormal   Collection Time: 01/14/24  7:52 AM  Result Value Ref Range   Total Protein 6.2 (L) 6.5 - 8.1 g/dL   Albumin 2.4 (L) 3.5 - 5.0 g/dL   AST 730 (H) 15 - 41 U/L    ALT 136 (H) 0 - 44 U/L   Alkaline Phosphatase 61 38 - 126 U/L   Total Bilirubin 1.6 (H) 0.0 - 1.2 mg/dL   Bilirubin, Direct 0.6 (H) 0.0 - 0.2 mg/dL   Indirect Bilirubin 1.0 (H) 0.3 - 0.9 mg/dL    Comment: Performed at [facility], [address], [place], [place] [address]  Magnesium     Status: None   Collection Time: 01/14/24  7:53 AM  Result Value Ref Range   Magnesium 2.4 1.7 - 2.4 mg/dL    Comment: Performed at [facility], [address] [place], [place] [address]  Glucose, capillary     Status: Abnormal   Collection Time: 01/14/24  7:57 AM  Result Value Ref Range   Glucose-Capillary 174 (H) 70 - 99 mg/dL    Comment: Glucose reference range applies only to samples taken after fasting for at least 8 hours.  Respiratory (~20  pathogens) panel by PCR     Status: None   Collection Time: 01/14/24  8:09 AM   Specimen: Nasopharyngeal Swab; Respiratory  Result Value Ref Range   Adenovirus NOT DETECTED NOT DETECTED   Coronavirus 229E NOT DETECTED NOT DETECTED    Comment: (NOTE) The Coronavirus on the Respiratory Panel, DOES NOT test for the novel  Coronavirus (2019 nCoV)    Coronavirus HKU1 NOT DETECTED NOT DETECTED   Coronavirus NL63 NOT DETECTED NOT DETECTED   Coronavirus OC43 NOT DETECTED NOT DETECTED   Metapneumovirus NOT DETECTED NOT DETECTED   Rhinovirus / Enterovirus NOT DETECTED NOT DETECTED   Influenza A NOT DETECTED NOT DETECTED   Influenza B NOT DETECTED NOT DETECTED   Parainfluenza Virus 1 NOT DETECTED NOT DETECTED   Parainfluenza Virus 2 NOT DETECTED NOT DETECTED   Parainfluenza Virus 3 NOT DETECTED NOT DETECTED   Parainfluenza Virus 4 NOT DETECTED NOT DETECTED   Respiratory Syncytial Virus NOT DETECTED NOT DETECTED   Bordetella pertussis NOT DETECTED NOT DETECTED   Bordetella Parapertussis NOT DETECTED NOT DETECTED   Chlamydophila pneumoniae NOT DETECTED NOT DETECTED   Mycoplasma pneumoniae NOT DETECTED NOT DETECTED    Comment: Performed at [facility], [address] [place], [place] [address]  CBC     Status: Abnormal   Collection Time: 01/14/24  9:14 AM  Result Value Ref Range   WBC 7.2 4.0 - 10.5 K/uL   RBC 3.80 (L) 4.22 - 5.81 MIL/uL   Hemoglobin 11.3 (L) 13.0 - 17.0 g/dL   HCT 68.2 (L) 60.9 - 47.9 %   MCV 83.4 80.0 - 100.0 fL   MCH 29.7 26.0 - 34.0 pg   MCHC 35.6 30.0 - 36.0 g/dL   RDW 86.0 88.4 - 84.4 %   Platelets 201 150 - 400 K/uL   nRBC 0.0 0.0 - 0.2 %    Comment: Performed at [facility], [address], [place], [place] [address]  APTT     Status: Abnormal   Collection Time: 01/14/24 10:33 AM  Result Value Ref Range   aPTT 52 (H) 24 - 36 seconds    Comment:        IF BASELINE aPTT IS ELEVATED, SUGGEST PATIENT RISK ASSESSMENT BE USED TO DETERMINE APPROPRIATE ANTICOAGULANT THERAPY. Performed at [facility] Lab, [address] [place], [place] [address]   Glucose, capillary     Status: Abnormal   Collection Time: 01/14/24 11:38 AM  Result Value Ref Range   Glucose-Capillary 139 (H) 70 - 99 mg/dL    Comment: Glucose reference range applies only to samples taken after fasting for at least 8 hours.  Glucose, capillary     Status: Abnormal   Collection Time: 01/14/24  3:30 PM  Result Value Ref Range   Glucose-Capillary 220 (H) 70 - 99 mg/dL    Comment: Glucose reference range applies only to samples taken after fasting for at least 8 hours.  Renal function panel (daily at 1600)     Status: Abnormal   Collection Time: 01/14/24  4:14 PM  Result Value Ref Range   Sodium 133 (L) 135 - 145 mmol/L   Potassium 3.6 3.5 - 5.1 mmol/L   Chloride 97 (L) 98 - 111 mmol/L   CO2 24 22 - 32 mmol/L   Glucose, Bld 238 (H) 70 - 99 mg/dL    Comment: Glucose reference range applies only to samples taken after fasting for at least 8 hours.   BUN 31 (  H) 6 - 20 mg/dL   Creatinine, Ser 5.00 (H) 0.61 - 1.24 mg/dL   Calcium 7.5 (L) 8.9 - 10.3 mg/dL   Phosphorus 3.0 2.5 - 4.6 mg/dL   Albumin 2.4 (L) 3.5 - 5.0 g/dL    GFR, Estimated 13 (L) >60 mL/min    Comment: (NOTE) Calculated using the CKD-EPI Creatinine Equation (2021)    Anion gap 12 5 - 15    Comment: Performed at [facility] Lab, [address], [place], [place] [address]  Glucose, capillary     Status: Abnormal   Collection Time: 01/14/24 11:24 PM  Result Value Ref Range   Glucose-Capillary 212 (H) 70 - 99 mg/dL    Comment: Glucose reference range applies only to samples taken after fasting for at least 8 hours.  Glucose, capillary     Status: Abnormal   Collection Time: 01/15/24  3:18 AM  Result Value Ref Range   Glucose-Capillary 206 (H) 70 - 99 mg/dL    Comment: Glucose reference range applies only to samples taken after fasting for at least 8 hours.  Renal function panel (daily at 0500)     Status: Abnormal   Collection Time: 01/15/24  4:26 AM  Result Value Ref Range   Sodium 131 (L) 135 - 145 mmol/L   Potassium 3.4 (L) 3.5 - 5.1 mmol/L   Chloride 99 98 - 111 mmol/L   CO2 22 22 - 32 mmol/L   Glucose, Bld 214 (H) 70 - 99 mg/dL    Comment: Glucose reference range applies only to samples taken after fasting for at least 8 hours.   BUN 37 (H) 6 - 20 mg/dL   Creatinine, Ser 4.25 (H) 0.61 - 1.24 mg/dL   Calcium 7.9 (L) 8.9 - 10.3 mg/dL   Phosphorus 1.9 (L) 2.5 - 4.6 mg/dL   Albumin 2.3 (L) 3.5 - 5.0 g/dL   GFR, Estimated 11 (L) >60 mL/min    Comment: (NOTE) Calculated using the CKD-EPI Creatinine Equation (2021)    Anion gap 10 5 - 15    Comment: Performed at [facility], [address] [place], [place] [address]  Magnesium     Status: None   Collection Time: 01/15/24  4:26 AM  Result Value Ref Range   Magnesium 2.4 1.7 - 2.4 mg/dL    Comment: Performed at [facility], [address], [place], [place] [address]  APTT     Status: Abnormal   Collection Time: 01/15/24  4:26 AM  Result Value Ref Range   aPTT 43 (H) 24 - 36 seconds    Comment:        IF BASELINE aPTT IS ELEVATED, SUGGEST PATIENT RISK  ASSESSMENT BE USED TO DETERMINE APPROPRIATE ANTICOAGULANT THERAPY. Performed at [facility], [address] [place], [place] [address]   CBC     Status: Abnormal   Collection Time: 01/15/24  4:26 AM  Result Value Ref Range   WBC 6.3 4.0 - 10.5 K/uL   RBC 3.51 (L) 4.22 - 5.81 MIL/uL   Hemoglobin 10.5 (L) 13.0 - 17.0 g/dL   HCT 70.5 (L) 60.9 - 47.9 %   MCV 83.8 80.0 - 100.0 fL   MCH 29.9 26.0 - 34.0 pg   MCHC 35.7 30.0 - 36.0 g/dL   RDW 86.1 88.4 - 84.4 %   Platelets 238 150 - 400 K/uL   nRBC 0.0 0.0 - 0.2 %    Comment: Performed at [facility],  [address], [place], [place] [address]  Comprehensive metabolic panel with GFR     Status: Abnormal   Collection Time: 01/15/24  4:26 AM  Result Value Ref Range   Sodium 131 (L) 135 - 145 mmol/L   Potassium 3.4 (L) 3.5 - 5.1 mmol/L   Chloride 99 98 - 111 mmol/L   CO2 22 22 - 32 mmol/L   Glucose, Bld 215 (H) 70 - 99 mg/dL    Comment: Glucose reference range applies only to samples taken after fasting for at least 8 hours.   BUN 38 (H) 6 - 20 mg/dL   Creatinine, Ser 4.16 (H) 0.61 - 1.24 mg/dL   Calcium 7.8 (L) 8.9 - 10.3 mg/dL   Total Protein 5.8 (L) 6.5 - 8.1 g/dL   Albumin 2.3 (L) 3.5 - 5.0 g/dL   AST 804 (H) 15 - 41 U/L   ALT 113 (H) 0 - 44 U/L   Alkaline Phosphatase 61 38 - 126 U/L   Total Bilirubin 1.4 (H) 0.0 - 1.2 mg/dL   GFR, Estimated 11 (L) >60 mL/min    Comment: (NOTE) Calculated using the CKD-EPI Creatinine Equation (2021)    Anion gap 10 5 - 15    Comment: Performed at [facility], [address], [place], [place] [address]  CK     Status: Abnormal   Collection Time: 01/15/24  4:26 AM  Result Value Ref Range   Total CK 4,013 (H) 49 - 397 U/L    Comment: RESULT CONFIRMED BY MANUAL DILUTION Performed at [facility] Lab, [address] [place], [place] [address]   Glucose, capillary     Status: Abnormal   Collection Time: 01/15/24  7:43 AM  Result Value Ref Range   Glucose-Capillary 183  (H) 70 - 99 mg/dL    Comment: Glucose reference range applies only to samples taken after fasting for at least 8 hours.   US  Abdomen Limited RUQ (LIVER/GB) Result Date: 01/14/2024 CLINICAL DATA:  397769 LFT elevation 602230. EXAM: ULTRASOUND ABDOMEN LIMITED RIGHT UPPER QUADRANT COMPARISON:  CT scan abdomen and pelvis from 01/12/2024. FINDINGS: Gallbladder: The gallbladder is physiologically distended. Small amount of sludge noted. No gallstones or wall thickening visualized. No sonographic Murphy sign noted by sonographer. Common bile duct: Diameter: Up to 4 mm.  No intrahepatic bile duct dilation. Liver: No focal lesion identified. Within normal limits in parenchymal echogenicity. Portal vein is patent on color Doppler imaging with normal direction of blood flow towards the liver. Other: None. IMPRESSION: *Essentially unremarkable right upper quadrant ultrasound examination. Electronically Signed   By: Ree Molt M.D.   On: 01/14/2024 15:54   ECHOCARDIOGRAM COMPLETE Result Date: 01/13/2024    ECHOCARDIOGRAM REPORT   Patient Name:   MOSS BERRY Date of Exam: 01/13/2024 Medical Rec #:  [id number]       Height:       72.0 in Accession #:    [id number]      Weight:       241.2 lb Date of Birth:  01/15/1966       BSA:          2.307 m Patient Age:    58 years        BP:           144/90 mmHg Patient Gender: M               HR:           93 bpm. Exam Location:  Inpatient Procedure: 2D Echo, Color Doppler and Cardiac Doppler (Both Spectral and Color            Flow Doppler were utilized during procedure). Indications:    CHF- Acute Systolic I50.21  History:        Patient has no prior history of Echocardiogram examinations.                 Renal Failure; Risk Factors:Hypertension, Dyslipidemia and                 Diabetes.  Sonographer:    Koleen Popper RDCS Referring Phys: [phone] EKTA V PATEL IMPRESSIONS  1. Left ventricular ejection fraction, by estimation, is 65 to 70%. The left ventricle has normal  function. The left ventricle has no regional wall motion abnormalities. There is severe concentric left ventricular hypertrophy. Left ventricular diastolic  parameters were normal.  2. Right ventricular systolic function is normal. The right ventricular size is normal. Tricuspid regurgitation signal is inadequate for assessing PA pressure.  3. The mitral valve is grossly normal. Mild mitral valve regurgitation. No evidence of mitral stenosis.  4. The aortic valve is tricuspid. Aortic valve regurgitation is not visualized. No aortic stenosis is present.  5. The inferior vena cava is normal in size with <50% respiratory variability, suggesting right atrial pressure of 8 mmHg. FINDINGS  Left Ventricle: Left ventricular ejection fraction, by estimation, is 65 to 70%. The left ventricle has normal function. The left ventricle has no regional wall motion abnormalities. The left ventricular internal cavity size was normal in size. There is  severe concentric left ventricular hypertrophy. Left ventricular diastolic parameters were normal. Right Ventricle: The right ventricular size is normal. No increase in right ventricular wall thickness. Right ventricular systolic function is normal. Tricuspid regurgitation signal is inadequate for assessing PA pressure. Left Atrium: Left atrial size was normal in size. Right Atrium: Right atrial size was normal in size. Pericardium: There is no evidence of pericardial effusion. Mitral Valve: The mitral valve is grossly normal. Mild mitral valve regurgitation. No evidence of mitral valve stenosis. Tricuspid Valve: The tricuspid valve is normal in structure. Tricuspid valve regurgitation is trivial. No evidence of tricuspid stenosis. Aortic Valve: The aortic valve is tricuspid. Aortic valve regurgitation is not visualized. No aortic stenosis is present. Pulmonic Valve: The pulmonic valve was normal in structure. Pulmonic valve regurgitation is not visualized. No evidence of pulmonic  stenosis. Aorta: The aortic root is normal in size and structure. Venous: The inferior vena cava is normal in size with less than 50% respiratory variability, suggesting right atrial pressure of 8 mmHg. IAS/Shunts: The atrial septum is grossly normal.  LEFT VENTRICLE PLAX 2D LVIDd:         4.70 cm   Diastology LVIDs:         2.90 cm   LV e' medial:    8.05 cm/s LV PW:         1.60 cm   LV E/e' medial:  12.1 LV IVS:        1.60 cm   LV e' lateral:   12.20 cm/s LVOT diam:     2.10 cm   LV E/e' lateral: 8.0 LV SV:         79 LV SV Index:   34 LVOT Area:     3.46 cm  RIGHT VENTRICLE             IVC RV S prime:     14.70 cm/s  IVC diam:  1.80 cm TAPSE (M-mode): 2.0 cm LEFT ATRIUM             Index        RIGHT ATRIUM           Index LA diam:        4.00 cm 1.73 cm/m   RA Area:     15.30 cm LA Vol (A2C):   36.5 ml 15.82 ml/m  RA Volume:   35.80 ml  15.52 ml/m LA Vol (A4C):   62.9 ml 27.26 ml/m LA Biplane Vol: 50.7 ml 21.98 ml/m  AORTIC VALVE LVOT Vmax:   138.00 cm/s LVOT Vmean:  94.600 cm/s LVOT VTI:    0.229 m  AORTA Ao Root diam: 3.10 cm Ao Asc diam:  3.40 cm MITRAL VALVE MV Area (PHT): 4.31 cm    SHUNTS MV Decel Time: 176 msec    Systemic VTI:  0.23 m MV E velocity: 97.50 cm/s  Systemic Diam: 2.10 cm MV A velocity: 92.80 cm/s MV E/A ratio:  1.05 Soyla Merck MD Electronically signed by Soyla Merck MD Signature Date/Time: 01/13/2024/11:17:42 PM    Final     PMH:   Past Medical History:  Diagnosis Date   Diabetes mellitus without complication (HCC)    Hyperlipidemia    Hypertension     PSH:  History reviewed. No pertinent surgical history.  Allergies: No Known Allergies  Medications:   Prior to Admission medications   Medication Sig Start Date End Date Taking? Authorizing Provider  amLODipine  (NORVASC ) 5 MG tablet Take 1 tablet (5 mg total) by mouth daily. 08/29/23  Yes Thapa, [place], MD  atorvastatin (LIPITOR) 80 MG tablet Take 80 mg by mouth daily.   Yes [provider]   doxazosin  (CARDURA ) 2 MG tablet Take 2 mg by mouth daily.   Yes [provider]  eplerenone  (INSPRA ) 50 MG tablet Take 1 tablet (50 mg total) by mouth daily. 08/28/23  Yes Thapa, [place], MD  metFORMIN (GLUCOPHAGE) 500 MG tablet Take 500 mg by mouth daily with breakfast.   Yes [provider]  azithromycin  (ZITHROMAX ) 250 MG tablet Take 1 tablet (250 mg total) by mouth daily. 07/15/14   Terryl Kubas, NP  chlorpheniramine-HYDROcodone [facility] PENNKINETIC ER) 10-8 MG/5ML LQCR Take 5 mLs by mouth 2 (two) times daily. 07/15/14   Terryl Kubas, NP  doxazosin  (CARDURA ) 1 MG tablet Take 2 tablets (2 mg total) by mouth daily. Patient not taking: Reported on 01/12/2024 08/28/23   Thapa, [place], MD    Discontinued Meds:   Medications Discontinued During This Encounter  Medication Reason   acetaminophen  (TYLENOL ) tablet 650 mg    atorvastatin (LIPITOR) 40 MG tablet Discontinued by provider   ezetimibe (ZETIA) 10 MG tablet Discontinued by provider   doxazosin  (CARDURA ) 2 MG tablet Discontinued by provider   spironolactone  (ALDACTONE ) 100 MG tablet Discontinued by provider   verapamil  (CALAN -SR) 180 MG CR tablet Discontinued by provider   metFORMIN (GLUCOPHAGE) 500 MG tablet Discontinued by provider   lactated ringers  infusion    metroNIDAZOLE  (FLAGYL ) IVPB 500 mg    ceFEPIme (MAXIPIME) 2 g in sodium chloride  0.9 % 100 mL IVPB    famotidine  (PEPCID ) IVPB 20 mg premix    azithromycin  (ZITHROMAX ) 500 mg in sodium chloride  0.9 % 250 mL IVPB    metroNIDAZOLE  (FLAGYL ) IVPB 500 mg    LORazepam  (ATIVAN ) injection 2 mg    famotidine  (PEPCID ) tablet 20 mg    famotidine  (PEPCID ) tablet 20 mg    0.9 %  sodium chloride  infusion    metroNIDAZOLE  (FLAGYL ) tablet 500 mg    heparin  injection 5,000 Units    sodium bicarbonate  150 mEq in sterile water  1,150 mL infusion    hydrALAZINE  (APRESOLINE ) injection 10 mg    amLODipine  (NORVASC ) tablet 5 mg    heparin  10,000 units/ 20 mL infusion syringe     insulin  aspart (novoLOG ) injection 0-6 Units    prismasol  BGK 4/2.5 infusion    prismasol  BGK 4/2.5 infusion    prismasol  BGK 4/2.5 infusion     Social History:  reports that he has never smoked. He has never used smokeless tobacco. He reports that he does not drink alcohol. No history on file for drug use.  Family History:  History reviewed. No pertinent family history.  Blood pressure (!) 147/96, pulse (!) 102, temperature 99.7 F (37.6 C), temperature source Oral, resp. rate (!) 24, height 6' (1.829 m), weight 110.4 kg, SpO2 94%. Physical Exam: Gen NAD, awake and conversant, [place] [place]  O2 No rash, cyanosis or gangrene Sclera anicteric, throat clear  No jvd or bruits Chest clear bilat to bases RRR no MRG Abd soft ntnd no mass or ascites +bs GU foley cath in place Ext no LE or UE edema, no other edema Access LIJ temp     Hanya Guerin, LYNWOOD ORN, MD 01/15/2024, 10:18 AM

## 2024-01-16 ENCOUNTER — Encounter (HOSPITAL_COMMUNITY): Payer: Self-pay | Admitting: Internal Medicine

## 2024-01-16 DIAGNOSIS — M6282 Rhabdomyolysis: Secondary | ICD-10-CM | POA: Diagnosis not present

## 2024-01-16 DIAGNOSIS — N179 Acute kidney failure, unspecified: Secondary | ICD-10-CM | POA: Diagnosis not present

## 2024-01-16 DIAGNOSIS — A481 Legionnaires' disease: Secondary | ICD-10-CM | POA: Insufficient documentation

## 2024-01-16 DIAGNOSIS — J189 Pneumonia, unspecified organism: Secondary | ICD-10-CM

## 2024-01-16 DIAGNOSIS — R569 Unspecified convulsions: Secondary | ICD-10-CM | POA: Diagnosis not present

## 2024-01-16 DIAGNOSIS — K7200 Acute and subacute hepatic failure without coma: Secondary | ICD-10-CM | POA: Diagnosis not present

## 2024-01-16 DIAGNOSIS — E119 Type 2 diabetes mellitus without complications: Secondary | ICD-10-CM | POA: Diagnosis not present

## 2024-01-16 LAB — KAPPA/LAMBDA LIGHT CHAINS
Kappa free light chain: 83.9 mg/L — ABNORMAL HIGH (ref 3.3–19.4)
Kappa, lambda light chain ratio: 2.16 — ABNORMAL HIGH (ref 0.26–1.65)
Lambda free light chains: 38.9 mg/L — ABNORMAL HIGH (ref 5.7–26.3)

## 2024-01-16 LAB — PROTEIN / CREATININE RATIO, URINE
Creatinine, Urine: 66 mg/dL
Protein Creatinine Ratio: 0.44 mg/mg{creat} — ABNORMAL HIGH (ref 0.00–0.15)
Total Protein, Urine: 29 mg/dL

## 2024-01-16 LAB — CBC
HCT: 27.6 % — ABNORMAL LOW (ref 39.0–52.0)
Hemoglobin: 9.5 g/dL — ABNORMAL LOW (ref 13.0–17.0)
MCH: 29.5 pg (ref 26.0–34.0)
MCHC: 34.4 g/dL (ref 30.0–36.0)
MCV: 85.7 fL (ref 80.0–100.0)
Platelets: 305 K/uL (ref 150–400)
RBC: 3.22 MIL/uL — ABNORMAL LOW (ref 4.22–5.81)
RDW: 13.9 % (ref 11.5–15.5)
WBC: 6.2 K/uL (ref 4.0–10.5)
nRBC: 0 % (ref 0.0–0.2)

## 2024-01-16 LAB — APTT: aPTT: 37 s — ABNORMAL HIGH (ref 24–36)

## 2024-01-16 LAB — GLUCOSE, CAPILLARY
Glucose-Capillary: 264 mg/dL — ABNORMAL HIGH (ref 70–99)
Glucose-Capillary: 325 mg/dL — ABNORMAL HIGH (ref 70–99)
Glucose-Capillary: 290 mg/dL — ABNORMAL HIGH (ref 70–99)

## 2024-01-16 LAB — RENAL FUNCTION PANEL
Albumin: 2.1 g/dL — ABNORMAL LOW (ref 3.5–5.0)
Albumin: 2.2 g/dL — ABNORMAL LOW (ref 3.5–5.0)
Anion gap: 11 (ref 5–15)
Anion gap: 12 (ref 5–15)
BUN: 57 mg/dL — ABNORMAL HIGH (ref 6–20)
BUN: 65 mg/dL — ABNORMAL HIGH (ref 6–20)
CO2: 23 mmol/L (ref 22–32)
CO2: 22 mmol/L (ref 22–32)
Calcium: 8 mg/dL — ABNORMAL LOW (ref 8.9–10.3)
Calcium: 8.2 mg/dL — ABNORMAL LOW (ref 8.9–10.3)
Chloride: 96 mmol/L — ABNORMAL LOW (ref 98–111)
Chloride: 97 mmol/L — ABNORMAL LOW (ref 98–111)
Creatinine, Ser: 7.87 mg/dL — ABNORMAL HIGH (ref 0.61–1.24)
Creatinine, Ser: 7.92 mg/dL — ABNORMAL HIGH (ref 0.61–1.24)
GFR, Estimated: 7 mL/min — ABNORMAL LOW (ref >60)
GFR, Estimated: 7 mL/min — ABNORMAL LOW (ref >60)
Glucose, Bld: 281 mg/dL — ABNORMAL HIGH (ref 70–99)
Glucose, Bld: 340 mg/dL — ABNORMAL HIGH (ref 70–99)
Phosphorus: 3.5 mg/dL (ref 2.5–4.6)
Phosphorus: 3.4 mg/dL (ref 2.5–4.6)
Potassium: 3.5 mmol/L (ref 3.5–5.1)
Potassium: 3.5 mmol/L (ref 3.5–5.1)
Sodium: 130 mmol/L — ABNORMAL LOW (ref 135–145)
Sodium: 131 mmol/L — ABNORMAL LOW (ref 135–145)

## 2024-01-16 LAB — LEGIONELLA PNEUMOPHILA SEROGP 1 UR AG: L. pneumophila Serogp 1 Ur Ag: POSITIVE — AB

## 2024-01-16 LAB — ANTI-DNA ANTIBODY, DOUBLE-STRANDED: ds DNA Ab: <1 [IU]/mL (ref 0–9)

## 2024-01-16 LAB — ANCA PROFILE
Anti-MPO Antibodies: <0.2 U (ref 0.0–0.9)
Anti-PR3 Antibodies: <0.2 U (ref 0.0–0.9)
Atypical P-ANCA titer: <1:20 {titer}
C-ANCA: <1:20 {titer}
P-ANCA: <1:20 {titer}

## 2024-01-16 LAB — C3 COMPLEMENT: C3 Complement: 152 mg/dL (ref 82–167)

## 2024-01-16 LAB — GLOMERULAR BASEMENT MEMBRANE ANTIBODIES: GBM Ab: <0.2 U (ref 0.0–0.9)

## 2024-01-16 LAB — HEAVY METALS, BLOOD
Arsenic: 4 ug/L (ref 0–9)
Lead: <1 ug/dL (ref 0.0–3.4)
Mercury: 1.3 ug/L (ref 0.0–14.9)

## 2024-01-16 LAB — MAGNESIUM: Magnesium: 2.5 mg/dL — ABNORMAL HIGH (ref 1.7–2.4)

## 2024-01-16 LAB — C4 COMPLEMENT: Complement C4, Body Fluid: <2 mg/dL — ABNORMAL LOW (ref 12–38)

## 2024-01-16 MED ORDER — INSULIN GLARGINE-YFGN 100 UNIT/ML ~~LOC~~ SOLN
10.0000 [IU] | Freq: Every day | SUBCUTANEOUS | Status: DC
  Administered 2024-01-16: 10 [IU] via SUBCUTANEOUS
  Filled 2024-01-16 (×2): qty 0.1

## 2024-01-16 NOTE — Progress Notes (Signed)
 Adam Beasley is an 58 y.o. male w/ PMH as below who presented early 7/27 morning c/o cough, diarrhea and N/V for the last 4 days. Also SOB and fatigue. In the ED temp 103, RR 20, HR 121, BP 160/90. [place] 2 L at 93%.  Labs showed Na 130, K 3.6, CO2 14, bun 51, creat 8.81 (0.9 in may 2025) with hosp course complicated by seizures, CT showed new consolidation in LUL and LLL.  Assessment/Plan: AKI, severe: w/ normal b/l creat 0.9- 1.0 from jan and may 2025, eGFR 96 ml/min. Creat here is 8.6 in the setting of an acute resp illness x 1 week, w/ multifocal PNA most likely per CT. No acei/ ARB at home, BP's stable here. No nephrotoxins. On exam looks euvolemic, possibly a bit dry. CT showed bilat kidneys w/o obstruction. UA showed ^protein but no rbcs/ wbcs. Patient just had a significant seizure episode, this could possibly due to uremia. Not sure cause of AKI, vol depletion probably played a role initially but likely ATN from rhabdo (CPK 10k). Due to the seizures, which could be uremia-related, pt started on CRRT 7/27-29.  - Renal function continues to go the wrong direction but urine output trending in the right direction 849ml/24hr up from prior 24hr period (CRRT stopped on 7/29 evening). - No absolute indications for intermittent HD today and will hold for another 24 to 48 hours.    Hopefully we will see better clearance in the next few days w the increased urine output  Will follow closely with you.  -Monitor Daily I/Os, Daily weight  -Maintain MAP>65 for optimal renal perfusion.  - Avoid nephrotoxic agents such as IV contrast, NSAIDs, and phosphate containing bowel preps (FLEETS)  Multifocal PNA: per CT, on IV abx per pmd. +Legionella. Acute seizures: tonight, RR rx'd w/ IV ativan .   Subjective: Spouse bedside; appetite better, urine in the foley not as dark as yest   Chemistry and CBC: Creat  Date/Time Value Ref Range Status  07/16/2023 03:15 PM 0.93 0.70 - 1.30 mg/dL Final  87/97/7975  96:47 PM 0.95 0.70 - 1.30 mg/dL Final   Creatinine, Ser  Date/Time Value Ref Range Status  01/16/2024 04:26 AM 7.87 (H) 0.61 - 1.24 mg/dL Final  92/69/7974 94:85 PM 7.02 (H) 0.61 - 1.24 mg/dL Final  92/69/7974 95:73 AM 5.74 (H) 0.61 - 1.24 mg/dL Final  92/69/7974 95:73 AM 5.83 (H) 0.61 - 1.24 mg/dL Final  92/70/7974 95:85 PM 4.99 (H) 0.61 - 1.24 mg/dL Final  92/70/7974 94:99 AM 5.46 (H) 0.61 - 1.24 mg/dL Final  92/71/7974 95:91 PM 6.98 (H) 0.61 - 1.24 mg/dL Final  92/71/7974 94:85 AM 10.17 (H) 0.61 - 1.24 mg/dL Final  92/71/7974 94:87 AM 10.14 (H) 0.61 - 1.24 mg/dL Final  92/72/7974 98:58 PM 8.81 (H) 0.61 - 1.24 mg/dL Final  92/72/7974 90:48 AM 8.69 (H) 0.61 - 1.24 mg/dL Final  94/90/7974 97:80 PM 1.06 0.61 - 1.24 mg/dL Final  98/72/7983 88:59 PM 1.04 0.50 - 1.35 mg/dL Final   Recent Labs  Lab 01/13/24 0514 01/13/24 1608 01/14/24 0500 01/14/24 1614 01/15/24 0426 01/15/24 1714 01/16/24 0426  NA 133* 135 132* 133* 131*  131* 130* 130*  K 3.4* 3.2* 3.1* 3.6 3.4*  3.4* 3.4* 3.5  CL 100 101 93* 97* 99  99 96* 96*  CO2 17* 22 26 24 22  22 22 23   GLUCOSE 257* 155* 147* 238* 215*  214* 286* 281*  BUN 63* 45* 35* 31* 38*  37* 50* 57*  CREATININE 10.17* 6.98* 5.46* 4.99* 5.83*  5.74* 7.02* 7.87*  CALCIUM 7.1* 7.4* 7.3* 7.5* 7.8*  7.9* 7.7* 8.0*  PHOS 5.6* 2.9 2.7 3.0 1.9* 1.9* 3.5   Recent Labs  Lab 01/12/24 1119 01/12/24 1200 01/13/24 0512 01/14/24 0914 01/15/24 0426 01/16/24 0426  WBC 7.5  --  6.3 7.2 6.3 6.2  NEUTROABS 5.8  --   --   --   --   --   HGB 11.7*   < > 10.7* 11.3* 10.5* 9.5*  HCT 32.5*   < > 29.4* 31.7* 29.4* 27.6*  MCV 81.7  --  82.1 83.4 83.8 85.7  PLT 177  --  179 201 238 305   < > = values in this interval not displayed.   Liver Function Tests: Recent Labs  Lab 01/13/24 0512 01/13/24 0514 01/14/24 0752 01/14/24 1614 01/15/24 0426 01/15/24 1714 01/16/24 0426  AST 437*  --  269*  --  195*  --   --   ALT 167*  --  136*  --  113*  --   --    ALKPHOS 66  --  61  --  61  --   --   BILITOT 1.4*  --  1.6*  --  1.4*  --   --   PROT 5.9*  --  6.2*  --  5.8*  --   --   ALBUMIN 2.3*   < > 2.4*   < > 2.3*  2.3* 2.2* 2.1*   < > = values in this interval not displayed.   Recent Labs  Lab 01/12/24 2049  LIPASE 163*   Recent Labs  Lab 01/13/24 0512  AMMONIA 46*   Cardiac Enzymes: Recent Labs  Lab 01/12/24 2049 01/13/24 0512 01/14/24 0752 01/15/24 0426  CKTOTAL 13,277* 10,078* 3,761* 4,013*   Iron Studies: No results for input(s): IRON, TIBC, TRANSFERRIN, FERRITIN in the last 72 hours. PT/INR: @LABRCNTIP (inr:5)  Xrays/Other Studies: ) Results for orders placed or performed during the hospital encounter of 01/12/24 (from the past 48 hours)  CBC     Status: Abnormal   Collection Time: 01/14/24  9:14 AM  Result Value Ref Range   WBC 7.2 4.0 - 10.5 K/uL   RBC 3.80 (L) 4.22 - 5.81 MIL/uL   Hemoglobin 11.3 (L) 13.0 - 17.0 g/dL   HCT 68.2 (L) 60.9 - 47.9 %   MCV 83.4 80.0 - 100.0 fL   MCH 29.7 26.0 - 34.0 pg   MCHC 35.6 30.0 - 36.0 g/dL   RDW 86.0 88.4 - 84.4 %   Platelets 201 150 - 400 K/uL   nRBC 0.0 0.0 - 0.2 %    Comment: Performed at [facility], [address] [place], [place] [address]  APTT     Status: Abnormal   Collection Time: 01/14/24 10:33 AM  Result Value Ref Range   aPTT 52 (H) 24 - 36 seconds    Comment:        IF BASELINE aPTT IS ELEVATED, SUGGEST PATIENT RISK ASSESSMENT BE USED TO DETERMINE APPROPRIATE ANTICOAGULANT THERAPY. Performed at [facility], [address] [place], [place] [address]   Glucose, capillary     Status: Abnormal   Collection Time: 01/14/24 11:38 AM  Result Value Ref Range   Glucose-Capillary 139 (H) 70 - 99 mg/dL    Comment: Glucose reference range applies only to samples taken after fasting for at least 8 hours.  Glucose, capillary     Status: Abnormal  Collection Time: 01/14/24  3:30 PM  Result Value Ref Range   Glucose-Capillary 220 (H) 70 -  99 mg/dL    Comment: Glucose reference range applies only to samples taken after fasting for at least 8 hours.  Renal function panel (daily at 1600)     Status: Abnormal   Collection Time: 01/14/24  4:14 PM  Result Value Ref Range   Sodium 133 (L) 135 - 145 mmol/L   Potassium 3.6 3.5 - 5.1 mmol/L   Chloride 97 (L) 98 - 111 mmol/L   CO2 24 22 - 32 mmol/L   Glucose, Bld 238 (H) 70 - 99 mg/dL    Comment: Glucose reference range applies only to samples taken after fasting for at least 8 hours.   BUN 31 (H) 6 - 20 mg/dL   Creatinine, Ser 5.00 (H) 0.61 - 1.24 mg/dL   Calcium 7.5 (L) 8.9 - 10.3 mg/dL   Phosphorus 3.0 2.5 - 4.6 mg/dL   Albumin 2.4 (L) 3.5 - 5.0 g/dL   GFR, Estimated 13 (L) >60 mL/min    Comment: (NOTE) Calculated using the CKD-EPI Creatinine Equation (2021)    Anion gap 12 5 - 15    Comment: Performed at [facility], [address] [place], [place] [address]  Glucose, capillary     Status: Abnormal   Collection Time: 01/14/24 11:24 PM  Result Value Ref Range   Glucose-Capillary 212 (H) 70 - 99 mg/dL    Comment: Glucose reference range applies only to samples taken after fasting for at least 8 hours.  Glucose, capillary     Status: Abnormal   Collection Time: 01/15/24  3:18 AM  Result Value Ref Range   Glucose-Capillary 206 (H) 70 - 99 mg/dL    Comment: Glucose reference range applies only to samples taken after fasting for at least 8 hours.  Renal function panel (daily at 0500)     Status: Abnormal   Collection Time: 01/15/24  4:26 AM  Result Value Ref Range   Sodium 131 (L) 135 - 145 mmol/L   Potassium 3.4 (L) 3.5 - 5.1 mmol/L   Chloride 99 98 - 111 mmol/L   CO2 22 22 - 32 mmol/L   Glucose, Bld 214 (H) 70 - 99 mg/dL    Comment: Glucose reference range applies only to samples taken after fasting for at least 8 hours.   BUN 37 (H) 6 - 20 mg/dL   Creatinine, Ser 4.25 (H) 0.61 - 1.24 mg/dL   Calcium 7.9 (L) 8.9 - 10.3 mg/dL   Phosphorus 1.9 (L) 2.5 - 4.6  mg/dL   Albumin 2.3 (L) 3.5 - 5.0 g/dL   GFR, Estimated 11 (L) >60 mL/min    Comment: (NOTE) Calculated using the CKD-EPI Creatinine Equation (2021)    Anion gap 10 5 - 15    Comment: Performed at [facility], [address] [place], [place] [address]  Magnesium     Status: None   Collection Time: 01/15/24  4:26 AM  Result Value Ref Range   Magnesium 2.4 1.7 - 2.4 mg/dL    Comment: Performed at [facility], [address] [place], [place] [address]  APTT     Status: Abnormal   Collection Time: 01/15/24  4:26 AM  Result Value Ref Range   aPTT 43 (H) 24 - 36 seconds    Comment:        IF BASELINE aPTT IS ELEVATED, SUGGEST PATIENT RISK ASSESSMENT BE USED TO  DETERMINE APPROPRIATE ANTICOAGULANT THERAPY. Performed at [facility], [address], [place], [place] [address]   CBC     Status: Abnormal   Collection Time: 01/15/24  4:26 AM  Result Value Ref Range   WBC 6.3 4.0 - 10.5 K/uL   RBC 3.51 (L) 4.22 - 5.81 MIL/uL   Hemoglobin 10.5 (L) 13.0 - 17.0 g/dL   HCT 70.5 (L) 60.9 - 47.9 %   MCV 83.8 80.0 - 100.0 fL   MCH 29.9 26.0 - 34.0 pg   MCHC 35.7 30.0 - 36.0 g/dL   RDW 86.1 88.4 - 84.4 %   Platelets 238 150 - 400 K/uL   nRBC 0.0 0.0 - 0.2 %    Comment: Performed at [facility], [address], [place], [place] [address]  Comprehensive metabolic panel with GFR     Status: Abnormal   Collection Time: 01/15/24  4:26 AM  Result Value Ref Range   Sodium 131 (L) 135 - 145 mmol/L   Potassium 3.4 (L) 3.5 - 5.1 mmol/L   Chloride 99 98 - 111 mmol/L   CO2 22 22 - 32 mmol/L   Glucose, Bld 215 (H) 70 - 99 mg/dL    Comment: Glucose reference range applies only to samples taken after fasting for at least 8 hours.   BUN 38 (H) 6 - 20 mg/dL   Creatinine, Ser 4.16 (H) 0.61 - 1.24 mg/dL   Calcium 7.8 (L) 8.9 - 10.3 mg/dL   Total Protein 5.8 (L) 6.5 - 8.1 g/dL   Albumin 2.3 (L) 3.5 - 5.0 g/dL   AST 804 (H) 15 - 41 U/L   ALT 113 (H) 0 - 44 U/L   Alkaline  Phosphatase 61 38 - 126 U/L   Total Bilirubin 1.4 (H) 0.0 - 1.2 mg/dL   GFR, Estimated 11 (L) >60 mL/min    Comment: (NOTE) Calculated using the CKD-EPI Creatinine Equation (2021)    Anion gap 10 5 - 15    Comment: Performed at [facility], [address], [place], [place] [address]  CK     Status: Abnormal   Collection Time: 01/15/24  4:26 AM  Result Value Ref Range   Total CK 4,013 (H) 49 - 397 U/L    Comment: RESULT CONFIRMED BY MANUAL DILUTION Performed at [facility], [address], [place], [place] [address]   Glucose, capillary     Status: Abnormal   Collection Time: 01/15/24  7:43 AM  Result Value Ref Range   Glucose-Capillary 183 (H) 70 - 99 mg/dL    Comment: Glucose reference range applies only to samples taken after fasting for at least 8 hours.  Urinalysis, Routine w reflex microscopic -Urine, Catheterized; Indwelling urinary catheter     Status: Abnormal   Collection Time: 01/15/24 10:43 AM  Result Value Ref Range   Color, Urine YELLOW YELLOW   APPearance HAZY (A) CLEAR   Specific Gravity, Urine 1.010 1.005 - 1.030   pH 5.0 5.0 - 8.0   Glucose, UA 50 (A) NEGATIVE mg/dL   Hgb urine dipstick LARGE (A) NEGATIVE   Bilirubin Urine NEGATIVE NEGATIVE   Ketones, ur NEGATIVE NEGATIVE mg/dL   Protein, ur 30 (A) NEGATIVE mg/dL   Nitrite NEGATIVE NEGATIVE   Leukocytes,Ua TRACE (A) NEGATIVE   RBC / HPF >50 0 - 5 RBC/hpf   WBC, UA 21-50 0 - 5 WBC/hpf   Bacteria, UA RARE (A) NONE SEEN   Squamous Epithelial / HPF  0-5 0 - 5 /HPF   Mucus PRESENT     Comment: Performed at [facility], [address], [place], [place] [address]  Anti-DNA antibody, double-stranded     Status: None   Collection Time: 01/15/24 11:41 AM  Result Value Ref Range   ds DNA Ab <1 0 - 9 IU/mL    Comment: (NOTE)                                   Negative      <5                                   Equivocal  5 - 9                                   Positive      >9 Performed At: [facility] [address] [place], [place] [id number] Jennette Shorter MD Ey:[id number]   Hepatitis B surface antibody,qualitative     Status: Abnormal   Collection Time: 01/15/24 11:41 AM  Result Value Ref Range   Hep B S Ab Reactive (A) NON REACTIVE    Comment: (NOTE) Consistent with immunity, greater than 9.9 mIU/mL.  Performed at [facility], [address], [place], [place] [address]   Hepatitis B surface antigen     Status: None   Collection Time: 01/15/24 11:41 AM  Result Value Ref Range   Hepatitis B Surface Ag NON REACTIVE NON REACTIVE    Comment: Performed at [facility], [address], [place], [place] [address]  Hepatitis C antibody     Status: None   Collection Time: 01/15/24 11:41 AM  Result Value Ref Range   HCV Ab NON REACTIVE NON REACTIVE    Comment: (NOTE) Nonreactive HCV antibody screen is consistent with no HCV infections,  unless recent infection is suspected or other evidence exists to indicate HCV infection.  Performed at [facility], [address] [place], [place] [address]   Glucose, capillary     Status: Abnormal   Collection Time: 01/15/24 11:47 AM  Result Value Ref Range   Glucose-Capillary 272 (H) 70 - 99 mg/dL    Comment: Glucose reference range applies only to samples taken after fasting for at least 8 hours.  Glucose, capillary     Status: Abnormal   Collection Time: 01/15/24  3:28 PM  Result Value Ref Range   Glucose-Capillary 276 (H) 70 - 99 mg/dL    Comment: Glucose reference range applies only to samples taken after fasting for at least 8 hours.  Renal function panel (daily at 1600)     Status: Abnormal   Collection Time: 01/15/24  5:14 PM  Result Value Ref Range   Sodium 130 (L) 135 - 145 mmol/L   Potassium 3.4 (L) 3.5 - 5.1 mmol/L   Chloride 96 (L) 98 - 111 mmol/L   CO2 22 22 - 32 mmol/L   Glucose, Bld 286 (H) 70 - 99 mg/dL    Comment: Glucose reference range applies only to samples taken after fasting  for at least 8 hours.   BUN 50 (H) 6 - 20 mg/dL   Creatinine, Ser 2.97 (H) 0.61 - 1.24 mg/dL   Calcium 7.7 (L)  8.9 - 10.3 mg/dL   Phosphorus 1.9 (L) 2.5 - 4.6 mg/dL   Albumin 2.2 (L) 3.5 - 5.0 g/dL   GFR, Estimated 8 (L) >60 mL/min    Comment: (NOTE) Calculated using the CKD-EPI Creatinine Equation (2021)    Anion gap 12 5 - 15    Comment: Performed at [facility], [address], [place], [place] [address]  Glucose, capillary     Status: Abnormal   Collection Time: 01/15/24  7:37 PM  Result Value Ref Range   Glucose-Capillary 254 (H) 70 - 99 mg/dL    Comment: Glucose reference range applies only to samples taken after fasting for at least 8 hours.  Glucose, capillary     Status: Abnormal   Collection Time: 01/15/24 11:31 PM  Result Value Ref Range   Glucose-Capillary 267 (H) 70 - 99 mg/dL    Comment: Glucose reference range applies only to samples taken after fasting for at least 8 hours.  Protein / creatinine ratio, urine     Status: Abnormal   Collection Time: 01/16/24  4:15 AM  Result Value Ref Range   Creatinine, Urine 66 mg/dL   Total Protein, Urine 29 mg/dL    Comment: NO NORMAL RANGE ESTABLISHED FOR THIS TEST   Protein Creatinine Ratio 0.44 (H) 0.00 - 0.15 mg/mg[Cre]    Comment: Performed at [facility], [address] [place], [place] [address]  Renal function panel (daily at 0500)     Status: Abnormal   Collection Time: 01/16/24  4:26 AM  Result Value Ref Range   Sodium 130 (L) 135 - 145 mmol/L   Potassium 3.5 3.5 - 5.1 mmol/L   Chloride 96 (L) 98 - 111 mmol/L   CO2 23 22 - 32 mmol/L   Glucose, Bld 281 (H) 70 - 99 mg/dL    Comment: Glucose reference range applies only to samples taken after fasting for at least 8 hours.   BUN 57 (H) 6 - 20 mg/dL   Creatinine, Ser 2.12 (H) 0.61 - 1.24 mg/dL   Calcium 8.0 (L) 8.9 - 10.3 mg/dL   Phosphorus 3.5 2.5 - 4.6 mg/dL   Albumin 2.1 (L) 3.5 - 5.0 g/dL   GFR, Estimated 7 (L) >60 mL/min    Comment:  (NOTE) Calculated using the CKD-EPI Creatinine Equation (2021)    Anion gap 11 5 - 15    Comment: Performed at [facility], [address], [place], [place] [address]  Magnesium     Status: Abnormal   Collection Time: 01/16/24  4:26 AM  Result Value Ref Range   Magnesium 2.5 (H) 1.7 - 2.4 mg/dL    Comment: Performed at [facility], [address], [place], [place] [address]  APTT     Status: Abnormal   Collection Time: 01/16/24  4:26 AM  Result Value Ref Range   aPTT 37 (H) 24 - 36 seconds    Comment:        IF BASELINE aPTT IS ELEVATED, SUGGEST PATIENT RISK ASSESSMENT BE USED TO DETERMINE APPROPRIATE ANTICOAGULANT THERAPY. Performed at [facility], [address], [place], [place] [address]   CBC     Status: Abnormal   Collection Time: 01/16/24  4:26 AM  Result Value Ref Range   WBC 6.2 4.0 - 10.5 K/uL   RBC 3.22 (L) 4.22 - 5.81 MIL/uL   Hemoglobin 9.5 (L) 13.0 - 17.0 g/dL   HCT 72.3 (L) 60.9 - 47.9 %  MCV 85.7 80.0 - 100.0 fL   MCH 29.5 26.0 - 34.0 pg   MCHC 34.4 30.0 - 36.0 g/dL   RDW 86.0 88.4 - 84.4 %   Platelets 305 150 - 400 K/uL   nRBC 0.0 0.0 - 0.2 %    Comment: Performed at [facility], [address] [place], [place] [address]  Glucose, capillary     Status: Abnormal   Collection Time: 01/16/24  7:47 AM  Result Value Ref Range   Glucose-Capillary 264 (H) 70 - 99 mg/dL    Comment: Glucose reference range applies only to samples taken after fasting for at least 8 hours.   US  Abdomen Limited RUQ (LIVER/GB) Result Date: 01/14/2024 CLINICAL DATA:  [id number] LFT elevation 602230. EXAM: ULTRASOUND ABDOMEN LIMITED RIGHT UPPER QUADRANT COMPARISON:  CT scan abdomen and pelvis from 01/12/2024. FINDINGS: Gallbladder: The gallbladder is physiologically distended. Small amount of sludge noted. No gallstones or wall thickening visualized. No sonographic Murphy sign noted by sonographer. Common bile duct: Diameter: Up to 4 mm.  No intrahepatic bile duct  dilation. Liver: No focal lesion identified. Within normal limits in parenchymal echogenicity. Portal vein is patent on color Doppler imaging with normal direction of blood flow towards the liver. Other: None. IMPRESSION: *Essentially unremarkable right upper quadrant ultrasound examination. Electronically Signed   By: Ree Molt M.D.   On: 01/14/2024 15:54    PMH:   Past Medical History:  Diagnosis Date   Diabetes mellitus without complication (HCC)    Hyperlipidemia    Hypertension     PSH:  History reviewed. No pertinent surgical history.  Allergies: No Known Allergies  Medications:   Prior to Admission medications   Medication Sig Start Date End Date Taking? Authorizing Provider  amLODipine  (NORVASC ) 5 MG tablet Take 1 tablet (5 mg total) by mouth daily. 08/29/23  Yes Thapa, [place], MD  atorvastatin (LIPITOR) 80 MG tablet Take 80 mg by mouth daily.   Yes [provider]  doxazosin  (CARDURA ) 2 MG tablet Take 2 mg by mouth daily.   Yes [provider]  eplerenone  (INSPRA ) 50 MG tablet Take 1 tablet (50 mg total) by mouth daily. 08/28/23  Yes Thapa, [place], MD  metFORMIN (GLUCOPHAGE) 500 MG tablet Take 500 mg by mouth daily with breakfast.   Yes [provider]  azithromycin  (ZITHROMAX ) 250 MG tablet Take 1 tablet (250 mg total) by mouth daily. 07/15/14   Terryl Kubas, NP  chlorpheniramine-HYDROcodone [facility] PENNKINETIC ER) 10-8 MG/5ML LQCR Take 5 mLs by mouth 2 (two) times daily. 07/15/14   Terryl Kubas, NP  doxazosin  (CARDURA ) 1 MG tablet Take 2 tablets (2 mg total) by mouth daily. Patient not taking: Reported on 01/12/2024 08/28/23   Thapa, [place], MD    Discontinued Meds:   Medications Discontinued During This Encounter  Medication Reason   acetaminophen  (TYLENOL ) tablet 650 mg    atorvastatin (LIPITOR) 40 MG tablet Discontinued by provider   ezetimibe (ZETIA) 10 MG tablet Discontinued by provider   doxazosin  (CARDURA ) 2 MG tablet Discontinued by  provider   spironolactone  (ALDACTONE ) 100 MG tablet Discontinued by provider   verapamil  (CALAN -SR) 180 MG CR tablet Discontinued by provider   metFORMIN (GLUCOPHAGE) 500 MG tablet Discontinued by provider   lactated ringers  infusion    metroNIDAZOLE  (FLAGYL ) IVPB 500 mg    ceFEPIme (MAXIPIME) 2 g in sodium chloride  0.9 % 100 mL IVPB    famotidine  (PEPCID ) IVPB 20 mg premix    azithromycin  (ZITHROMAX ) 500 mg in  sodium chloride  0.9 % 250 mL IVPB    metroNIDAZOLE  (FLAGYL ) IVPB 500 mg    LORazepam  (ATIVAN ) injection 2 mg    famotidine  (PEPCID ) tablet 20 mg    famotidine  (PEPCID ) tablet 20 mg    0.9 %  sodium chloride  infusion    metroNIDAZOLE  (FLAGYL ) tablet 500 mg    heparin  injection 5,000 Units    sodium bicarbonate  150 mEq in sterile water  1,150 mL infusion    hydrALAZINE  (APRESOLINE ) injection 10 mg    amLODipine  (NORVASC ) tablet 5 mg    heparin  10,000 units/ 20 mL infusion syringe    insulin  aspart (novoLOG ) injection 0-6 Units    prismasol  BGK 4/2.5 infusion    prismasol  BGK 4/2.5 infusion    prismasol  BGK 4/2.5 infusion     Social History:  reports that he has never smoked. He has never used smokeless tobacco. He reports that he does not drink alcohol. No history on file for drug use.  Family History:  History reviewed. No pertinent family history.  Blood pressure 135/87, pulse 87, temperature 99.3 F (37.4 C), temperature source Oral, resp. rate (!) 27, height 6' (1.829 m), weight 110.6 kg, SpO2 97%. Physical Exam: Gen NAD, awake and conversant, Kevin  O2 No rash, cyanosis or gangrene Sclera anicteric, throat clear  No jvd or bruits Chest clear bilat to bases RRR no MRG Abd soft ntnd no mass or ascites +bs GU foley cath in place Ext no LE or UE edema, no other edema Access LIJ temp     MELIA LYNWOOD ORN, MD 01/16/2024, 9:04 AM

## 2024-01-16 NOTE — Evaluation (Signed)
 Physical Therapy Evaluation Patient Details Name: Adam Beasley MRN: [id number] DOB: 04/17/1966 Today's Date: 01/16/2024  History of Present Illness  58 y.o. male PMH: presenting from [facility] 7/27 acutely ill with nausea vomiting diarrhea x 4 days along with shortness of breath and fatigue. Experienced 5 min seizure transferred to ICU and placed on CRRT. Receiving treatment for AKI, acute liver injury, multilobular PNA caused by legionella. PMH: essential hypertension, diabetes mellitus type 2  Clinical Impression  PTA pt living with wife and family in 2 story home with bed and bath on second floor. 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 Pt is currently limited in safe mobility by orthostatic hypotension, (see General Comments) and generalized weakness. Pt is min A for bed mobility, and transfers and contact guard for ambulation with RW. Pt will likely not have any discharge therapy or DME needs. PT will continue to follow acutely to progress mobility towards baseline levels.         If plan is discharge home, recommend the following: A little help with walking and/or transfers;A little help with bathing/dressing/bathroom;Assistance with cooking/housework;Assist for transportation;Help with stairs or ramp for entrance   Can travel by private vehicle    Yes    Equipment Recommendations None recommended by PT     Functional Status Assessment Patient has had a recent decline in their functional status and demonstrates the ability to make significant improvements in function in a reasonable and predictable amount of time.     Precautions / Restrictions Precautions Precautions: None Restrictions Weight Bearing Restrictions Per Provider Order: No      Mobility  Bed Mobility Overal bed mobility: Needs Assistance Bed Mobility: Supine to Sit     Supine to sit: HOB elevated, Min assist     General bed mobility comments: able to utilize bed rail to come to upright,  min A for pulling against PT to bring hips to EoB    Transfers Overall transfer level: Needs assistance   Transfers: Sit to/from Stand Sit to Stand: Min assist, Supervision           General transfer comment: min A for steadying with initial power up and steadying, performed 1 more sit<>stand from bed and then 3x from recliner    Ambulation/Gait Ambulation/Gait assistance: Contact guard assist Gait Distance (Feet): 25 Feet Assistive device: Rolling walker (2 wheels) Gait Pattern/deviations: Step-through pattern, WFL(Within Functional Limits) Gait velocity: slowed Gait velocity interpretation: <1.8 ft/sec, indicate of risk for recurrent falls   General Gait Details: overall WFL for novel use of RW, cues for upright posture and proximity to RW      Balance Overall balance assessment: Mild deficits observed, not formally tested                                           Pertinent Vitals/Pain Pain Assessment Pain Assessment: No/denies pain    Home Living Family/patient expects to be discharged to:: Private residence Living Arrangements: Spouse/significant other Available Help at Discharge: Family;Available 24 hours/day Type of Home: House Home Access: Stairs to enter   [employer] of Steps: 1 Alternate Level Stairs-Number of Steps: 15 Home Layout: Two level;1/2 bath on main level;Bed/bath upstairs Home Equipment: Hand held shower head;Grab bars - tub/shower      Prior Function Prior Level of Function : Independent/Modified Independent  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, fast food Extremity Assessment: Overall WFL for tasks assessed    Lower Extremity Assessment Lower Extremity Assessment: Overall WFL for tasks assessed    Cervical / Trunk Assessment Cervical / Trunk Assessment: Normal  Communication   Communication Communication: No apparent  difficulties    Cognition Arousal: Alert Behavior During Therapy: WFL for tasks assessed/performed   PT - Cognitive impairments: No apparent impairments                         Following commands: Intact       Cueing Cueing Techniques: Verbal cues, Gestural cues, Tactile cues     General Comments General comments (skin integrity, edema, etc.): Pt initially on 4L O2 via [place] at 98% SpO2, removed  and pt is able to maintain SpO2>91%O2 duration of session.  In supine pt BP 138/84, HR 118 bpm BP in standing BP 86/54 HR 138 bpm, pt assymptomatic, 3 min standing BP 102/56 HR 118bpm, HR 120 Pt remains assymptomatic pt took pivotal steps and sat in recliner. BP 128/76 HR 110. Pt  request to try again BP in standing 90/59 HR 121, after 3 min BP 124/79, after ambulation 134/73 HR 110        Assessment/Plan    PT Assessment Patient needs continued PT services  PT Problem List Decreased strength;Decreased activity tolerance;Decreased balance;Decreased mobility;Cardiopulmonary status limiting activity       PT Treatment Interventions DME instruction;Gait training;Stair training;Functional mobility training;Therapeutic activities;Therapeutic exercise;Balance training;Neuromuscular re-education;Patient/family education    PT Goals (Current goals can be found in the Care Plan section)  Acute Rehab PT Goals PT Goal Formulation: With patient/family Time For Goal Achievement: 01/30/24 Potential to Achieve Goals: Fair    Frequency Min 2X/week        AM-PAC PT 6 Clicks Mobility  Outcome Measure Help needed turning from your back to your side while in a flat bed without using bedrails?: None Help needed moving from lying on your back to sitting on the side of a flat bed without using bedrails?: A Little Help needed moving to and from a bed to a chair (including a wheelchair)?: A Little Help needed standing up from a chair using your arms (e.g., wheelchair or bedside chair)?: A  Little Help needed to walk in hospital room?: A Little Help needed climbing 3-5 steps with a railing? : A Lot 6 Click Score: 18    End of Session Equipment Utilized During Treatment: Gait belt Activity Tolerance: Patient tolerated treatment well Patient left: in chair;with call bell/phone within reach;with nursing/sitter in room;with family/visitor present Nurse Communication: Mobility status PT Visit Diagnosis: Unsteadiness on feet (R26.81);Muscle weakness (generalized) (M62.81);Other (comment) (orthostatic hypotension)    Time: 1013-1110 PT Time Calculation (min) (ACUTE ONLY): 57 min   Charges:   PT Evaluation $PT Eval Moderate Complexity: 1 Mod PT Treatments $Gait Training: 8-22 mins $Therapeutic Activity: 23-37 mins PT General Charges $$ ACUTE PT VISIT: 1 Visit         Dalila Arca B. Fleeta Lapidus PT, DPT Acute Rehabilitation Services Please use secure chat or  Call Office [phone]   Almarie KATHEE Fleeta [facility] 01/16/2024, 11:35 AM

## 2024-01-16 NOTE — Progress Notes (Addendum)
 NAME:  Adam Beasley, MRN:  [id number], DOB:  16-Aug-1965, LOS: 4 ADMISSION DATE:  01/12/2024  History of Present Illness:  58 yo male [facility] with c/o n/v/d x 4 days. Pt also reported sob and general fatigue at time of presentation. He denies documented fever/chills, no accompanying abdominal pain. Upon presentation pt was noted to actually have fever, was tachycardic and noted to have elevated liver enzymes with normal lactate as well as in acute renal failure with BUN/Cr ~50/>8. He was subsequently transferred to [facility] and admitted to [facility] with nephrology consult.    Sepsis protocol was initiated for the pt, started on broad spectrum abx and volume resuscitation. CT chest abd/pelvis revealed new consolidation on L lung, stigmata of diarrheal process with inflammation and air levels of the distal colon. Unfortunately, shortly after transfer to [facility] pt began having generalized tonic clonic seizure like activity with post ictal ~61min long (unresponsive). RRT called and pt was given 2 mg ativan  which ceased activity. He reportedly had some amount of emesis and subsequently had diminished BS with need for supplemental oxygen.    CCM was asked to transfer to ICU for initiation of line and CRRT as despite the BUN only reporting ~50, thought was that his uremia/aki was resulting in sz like activity. CTH pending, vas cath placement pending and cefepime appropriately stopped after event.    Pt reportedly was in his normal state of health prior to his seemingly GI illness ~4-7 days ago. He additionally was having decreased PO intake during this time. No other complaints offered and pt is unable to coherently discuss HPI/ROS, but is protecting airway. They have consented to transfer to ICU and vas cath placement.  Pertinent  Medical History  HTN Hyperlipidemia DM2 with hyperglycemia Suspected OSA  Significant Hospital Events: Including procedures, antibiotic start and stop dates in  addition to other pertinent events   7/27 - Presented to [facility], transferred to [facility] for admission, later transferred to ICU for initiation of crrt.  7/29 - CRRT discontinued 7/30 - legionella positive  Interim History / Subjective:  Appetite and urine output improving, feeling a little better  Objective    Blood pressure 135/87, pulse 87, temperature 99.3 F (37.4 C), temperature source Oral, resp. rate (!) 27, height 6' (1.829 m), weight 110.6 kg, SpO2 97%.        Intake/Output Summary (Last 24 hours) at 01/16/2024 0815 Last data filed at 01/16/2024 0600 Gross per 24 hour  Intake 340 ml  Output 860 ml  Net -520 ml   Filed Weights   01/13/24 0500 01/14/24 0443 01/16/24 0439  Weight: 109.4 kg 110.4 kg 110.6 kg    Examination: General: NAD, resting comfortably Cardiovascular: RRR, no m/r/g Respiratory: normal work of breathing on 4L, slightly diminished breath sounds on left Abdomen: Normal bowel sounds, soft, non-tender Extremities: No swelling BLE  Resolved problem list  New onset seizure - suspected 2/2 metabolic derangement vs uremia  High AG metabolic acidosis Vomiting/diarrhea Assessment and Plan  Acute renal failure Rhabdomyolysis Suspect secondary to legionella infection, rhabdomyolysis, volume depletion. Still with decreased urine output but it is improving with 1L OP yesterday. Urine culture with no growth. CRRT stopped 7/29.  -Nephrology following -Monitor RFP, UOP   Acute hypoxic respiratory failure CT chest concerning for multilobar pneumonia, possible aspiration during seizure  Legionella pneumonia Blood cultures no growth at 3 days. Still with oxygen requirement. Legionella pneumophila positive result on 7/30. -S/p Ceftriaxone  (7/28-7/31), Continue Azithromycin  (7/28-) for 10  day course -Wean O2 as able -Aspiration precautions  -PT eval  Transaminitis LFTs continue to improve. RUQ US  normal.  -Can have tylenol  no more than 3g/day -OP GI follow up as  needed  T2DM with hyperglycemia -Holding home metformin -SSI moderate -10U [place]    Hx HTN, HLD Restarted home amlodipine , doxazosin  -Holding eplerenone  due to creatinine clearance -Holding home lipitor (rhabdomyolysis, LFT elevation) -PRN hydralazine  for SBP >160   Suspected OSA -Apneic periods during breathing noted during hospitalization -Consider OP sleep study   Best Practice (right click and Reselect all SmartList Selections daily)   Diet/type: Regular consistency (see orders) DVT prophylaxis prophylactic heparin   Pressure ulcer(s): N/A GI prophylaxis: N/A Lines: Dialysis Catheter Foley:  Yes, and it is still needed Code Status:  full code Last date of multidisciplinary goals of care discussion []   Labs   CBC: Recent Labs  Lab 01/12/24 1119 01/12/24 1200 01/12/24 1441 01/13/24 0512 01/14/24 0914 01/15/24 0426 01/16/24 0426  WBC 7.5  --   --  6.3 7.2 6.3 6.2  NEUTROABS 5.8  --   --   --   --   --   --   HGB 11.7*   < > 10.2* 10.7* 11.3* 10.5* 9.5*  HCT 32.5*   < > 30.0* 29.4* 31.7* 29.4* 27.6*  MCV 81.7  --   --  82.1 83.4 83.8 85.7  PLT 177  --   --  179 201 238 305   < > = values in this interval not displayed.    Basic Metabolic Panel: Recent Labs  Lab 01/12/24 1341 01/12/24 1441 01/13/24 0512 01/13/24 0514 01/14/24 0500 01/14/24 0753 01/14/24 1614 01/15/24 0426 01/15/24 1714 01/16/24 0426  NA 129*   < > 133*   < > 132*  --  133* 131*  131* 130* 130*  K 3.7   < > 3.4*   < > 3.1*  --  3.6 3.4*  3.4* 3.4* 3.5  CL 95*  --  100   < > 93*  --  97* 99  99 96* 96*  CO2 14*  --  17*   < > 26  --  24 22  22 22 23   GLUCOSE 305*  --  264*   < > 147*  --  238* 215*  214* 286* 281*  BUN 51*  --  64*   < > 35*  --  31* 38*  37* 50* 57*  CREATININE 8.81*  --  10.14*   < > 5.46*  --  4.99* 5.83*  5.74* 7.02* 7.87*  CALCIUM 7.9*  --  7.1*   < > 7.3*  --  7.5* 7.8*  7.9* 7.7* 8.0*  MG 2.0  --  2.4  --   --  2.4  --  2.4  --  2.5*  PHOS  --   --    --    < > 2.7  --  3.0 1.9* 1.9* 3.5   < > = values in this interval not displayed.   GFR: Estimated Creatinine Clearance: 13.1 mL/min (A) (by C-G formula based on SCr of 7.87 mg/dL (H)). Recent Labs  Lab 01/12/24 0959 01/12/24 1119 01/12/24 1445 01/12/24 2049 01/13/24 0512 01/14/24 0914 01/15/24 0426 01/16/24 0426  PROCALCITON  --   --   --  17.42  --   --   --   --   WBC  --    < >  --   --  6.3 7.2 6.3 6.2  LATICACIDVEN 1.6  --  1.6  --   --   --   --   --    < > = values in this interval not displayed.    Liver Function Tests: Recent Labs  Lab 01/12/24 0951 01/12/24 1119 01/13/24 0512 01/13/24 0514 01/14/24 0752 01/14/24 1614 01/15/24 0426 01/15/24 1714 01/16/24 0426  AST 834* 830* 437*  --  269*  --  195*  --   --   ALT 263* 264* 167*  --  136*  --  113*  --   --   ALKPHOS 78 77 66  --  61  --  61  --   --   BILITOT 2.2* 2.1* 1.4*  --  1.6*  --  1.4*  --   --   PROT 7.5 7.5 5.9*  --  6.2*  --  5.8*  --   --   ALBUMIN 3.6 3.6 2.3*   < > 2.4* 2.4* 2.3*  2.3* 2.2* 2.1*   < > = values in this interval not displayed.   Recent Labs  Lab 01/12/24 2049  LIPASE 163*   Recent Labs  Lab 01/13/24 0512  AMMONIA 46*    ABG    Component Value Date/Time   PHART 7.305 (L) 01/12/2024 1441   PCO2ART 30.9 (L) 01/12/2024 1441   PO2ART 76 (L) 01/12/2024 1441   HCO3 15.1 (L) 01/12/2024 1441   TCO2 16 (L) 01/12/2024 1441   ACIDBASEDEF 10.0 (H) 01/12/2024 1441   O2SAT 92 01/12/2024 1441     Coagulation Profile: Recent Labs  Lab 01/12/24 1119 01/13/24 0512  INR 1.1 1.2    Cardiac Enzymes: Recent Labs  Lab 01/12/24 2049 01/13/24 0512 01/14/24 0752 01/15/24 0426  CKTOTAL 13,277* 10,078* 3,761* 4,013*    HbA1C: Hgb A1c MFr Bld  Date/Time Value Ref Range Status  01/12/2024 08:49 PM 7.6 (H) 4.8 - 5.6 % Final    Comment:    (NOTE) Diagnosis of Diabetes The following HbA1c ranges recommended by the American Diabetes Association (ADA) may be used as an  aid in the diagnosis of diabetes mellitus.  Hemoglobin             Suggested A1C NGSP%              Diagnosis  <5.7                   Non Diabetic  5.7-6.4                Pre-Diabetic  >6.4                   Diabetic  <7.0                   Glycemic control for                       adults with diabetes.      CBG: Recent Labs  Lab 01/15/24 1147 01/15/24 1528 01/15/24 1937 01/15/24 2331 01/16/24 0747  GLUCAP 272* 276* 254* 267* 264*    Review of Systems:   N/A  Past Medical History:  He,  has a past medical history of Diabetes mellitus without complication (HCC), Hyperlipidemia, and Hypertension.   Surgical History:  History reviewed. No pertinent surgical history.   Social History:   reports that he has never smoked. He has never used smokeless tobacco. He reports that he does not drink alcohol.   Family  History:  His family history is not on file.   Allergies No Known Allergies   Home Medications  Prior to Admission medications   Medication Sig Start Date End Date Taking? Authorizing Provider  amLODipine  (NORVASC ) 5 MG tablet Take 1 tablet (5 mg total) by mouth daily. 08/29/23  Yes Thapa, [place], MD  atorvastatin (LIPITOR) 80 MG tablet Take 80 mg by mouth daily.   Yes [provider]  doxazosin  (CARDURA ) 2 MG tablet Take 2 mg by mouth daily.   Yes [provider]  eplerenone  (INSPRA ) 50 MG tablet Take 1 tablet (50 mg total) by mouth daily. 08/28/23  Yes Thapa, [place], MD  metFORMIN (GLUCOPHAGE) 500 MG tablet Take 500 mg by mouth daily with breakfast.   Yes [provider]  azithromycin  (ZITHROMAX ) 250 MG tablet Take 1 tablet (250 mg total) by mouth daily. 07/15/14   Terryl Kubas, NP  chlorpheniramine-HYDROcodone [facility] PENNKINETIC ER) 10-8 MG/5ML LQCR Take 5 mLs by mouth 2 (two) times daily. 07/15/14   Terryl Kubas, NP  doxazosin  (CARDURA ) 1 MG tablet Take 2 tablets (2 mg total) by mouth daily. Patient not taking: Reported on  01/12/2024 08/28/23   Thapa, [place], MD     Critical care time: 30 minutes

## 2024-01-16 NOTE — Progress Notes (Signed)
Pt transferred to [id number].

## 2024-01-17 ENCOUNTER — Inpatient Hospital Stay (HOSPITAL_COMMUNITY)

## 2024-01-17 DIAGNOSIS — N179 Acute kidney failure, unspecified: Secondary | ICD-10-CM | POA: Diagnosis not present

## 2024-01-17 DIAGNOSIS — G4089 Other seizures: Secondary | ICD-10-CM | POA: Diagnosis not present

## 2024-01-17 DIAGNOSIS — G40909 Epilepsy, unspecified, not intractable, without status epilepticus: Secondary | ICD-10-CM | POA: Diagnosis not present

## 2024-01-17 DIAGNOSIS — R197 Diarrhea, unspecified: Secondary | ICD-10-CM

## 2024-01-17 DIAGNOSIS — R0602 Shortness of breath: Secondary | ICD-10-CM | POA: Diagnosis not present

## 2024-01-17 DIAGNOSIS — J189 Pneumonia, unspecified organism: Secondary | ICD-10-CM | POA: Diagnosis not present

## 2024-01-17 DIAGNOSIS — Z452 Encounter for adjustment and management of vascular access device: Secondary | ICD-10-CM | POA: Diagnosis not present

## 2024-01-17 DIAGNOSIS — R112 Nausea with vomiting, unspecified: Secondary | ICD-10-CM

## 2024-01-17 DIAGNOSIS — R059 Cough, unspecified: Secondary | ICD-10-CM | POA: Diagnosis not present

## 2024-01-17 LAB — PROTEIN ELECTROPHORESIS, SERUM
A/G Ratio: 0.9 (ref 0.7–1.7)
Albumin ELP: 2.4 g/dL — ABNORMAL LOW (ref 2.9–4.4)
Alpha-1-Globulin: 0.5 g/dL — ABNORMAL HIGH (ref 0.0–0.4)
Alpha-2-Globulin: 0.6 g/dL (ref 0.4–1.0)
Beta Globulin: 0.8 g/dL (ref 0.7–1.3)
Gamma Globulin: 0.8 g/dL (ref 0.4–1.8)
Globulin, Total: 2.7 g/dL (ref 2.2–3.9)
Total Protein ELP: 5.1 g/dL — ABNORMAL LOW (ref 6.0–8.5)

## 2024-01-17 LAB — CULTURE, BLOOD (ROUTINE X 2)
Culture: NO GROWTH
Culture: NO GROWTH
Special Requests: ADEQUATE
Special Requests: ADEQUATE

## 2024-01-17 LAB — GLUCOSE, CAPILLARY
Glucose-Capillary: 243 mg/dL — ABNORMAL HIGH (ref 70–99)
Glucose-Capillary: 339 mg/dL — ABNORMAL HIGH (ref 70–99)
Glucose-Capillary: 309 mg/dL — ABNORMAL HIGH (ref 70–99)
Glucose-Capillary: 222 mg/dL — ABNORMAL HIGH (ref 70–99)

## 2024-01-17 LAB — RENAL FUNCTION PANEL
Albumin: 2.4 g/dL — ABNORMAL LOW (ref 3.5–5.0)
Albumin: 2.4 g/dL — ABNORMAL LOW (ref 3.5–5.0)
Anion gap: 15 (ref 5–15)
Anion gap: 14 (ref 5–15)
BUN: 73 mg/dL — ABNORMAL HIGH (ref 6–20)
BUN: 73 mg/dL — ABNORMAL HIGH (ref 6–20)
CO2: 21 mmol/L — ABNORMAL LOW (ref 22–32)
CO2: 22 mmol/L (ref 22–32)
Calcium: 8.4 mg/dL — ABNORMAL LOW (ref 8.9–10.3)
Calcium: 8.4 mg/dL — ABNORMAL LOW (ref 8.9–10.3)
Chloride: 97 mmol/L — ABNORMAL LOW (ref 98–111)
Chloride: 98 mmol/L (ref 98–111)
Creatinine, Ser: 7.77 mg/dL — ABNORMAL HIGH (ref 0.61–1.24)
Creatinine, Ser: 7.07 mg/dL — ABNORMAL HIGH (ref 0.61–1.24)
GFR, Estimated: 7 mL/min — ABNORMAL LOW (ref >60)
GFR, Estimated: 8 mL/min — ABNORMAL LOW (ref >60)
Glucose, Bld: 237 mg/dL — ABNORMAL HIGH (ref 70–99)
Glucose, Bld: 273 mg/dL — ABNORMAL HIGH (ref 70–99)
Phosphorus: 4.3 mg/dL (ref 2.5–4.6)
Phosphorus: 4.6 mg/dL (ref 2.5–4.6)
Potassium: 3.4 mmol/L — ABNORMAL LOW (ref 3.5–5.1)
Potassium: 3.3 mmol/L — ABNORMAL LOW (ref 3.5–5.1)
Sodium: 133 mmol/L — ABNORMAL LOW (ref 135–145)
Sodium: 134 mmol/L — ABNORMAL LOW (ref 135–145)

## 2024-01-17 LAB — CBC
HCT: 29.4 % — ABNORMAL LOW (ref 39.0–52.0)
Hemoglobin: 10.2 g/dL — ABNORMAL LOW (ref 13.0–17.0)
MCH: 29.4 pg (ref 26.0–34.0)
MCHC: 34.7 g/dL (ref 30.0–36.0)
MCV: 84.7 fL (ref 80.0–100.0)
Platelets: 436 K/uL — ABNORMAL HIGH (ref 150–400)
RBC: 3.47 MIL/uL — ABNORMAL LOW (ref 4.22–5.81)
RDW: 13.7 % (ref 11.5–15.5)
WBC: 7.2 K/uL (ref 4.0–10.5)
nRBC: 0 % (ref 0.0–0.2)

## 2024-01-17 LAB — HEPATITIS B CORE ANTIBODY, TOTAL: HEP B CORE AB: NEGATIVE

## 2024-01-17 LAB — APTT: aPTT: 33 s (ref 24–36)

## 2024-01-17 LAB — MAGNESIUM: Magnesium: 2.2 mg/dL (ref 1.7–2.4)

## 2024-01-17 MED ORDER — INSULIN ASPART 100 UNIT/ML IJ SOLN
0.0000 [IU] | Freq: Every day | INTRAMUSCULAR | Status: DC
  Administered 2024-01-17 – 2024-01-19 (×3): 2 [IU] via SUBCUTANEOUS

## 2024-01-17 MED ORDER — HYDRALAZINE HCL 20 MG/ML IJ SOLN: 10.0000 mg | Freq: Four times a day (QID) | INTRAMUSCULAR | Status: DC | PRN

## 2024-01-17 MED ORDER — INSULIN ASPART 100 UNIT/ML IJ SOLN
0.0000 [IU] | Freq: Three times a day (TID) | INTRAMUSCULAR | Status: DC
  Administered 2024-01-17 (×2): 7 [IU] via SUBCUTANEOUS
  Administered 2024-01-18: 0.3 [IU] via SUBCUTANEOUS
  Administered 2024-01-18: 3 [IU] via SUBCUTANEOUS
  Administered 2024-01-18: 5 [IU] via SUBCUTANEOUS
  Administered 2024-01-19: 7 [IU] via SUBCUTANEOUS
  Administered 2024-01-19: 2 [IU] via SUBCUTANEOUS
  Administered 2024-01-19: 1 [IU] via SUBCUTANEOUS
  Administered 2024-01-20: 3 [IU] via SUBCUTANEOUS
  Administered 2024-01-20: 2 [IU] via SUBCUTANEOUS

## 2024-01-17 MED ORDER — INSULIN ASPART 100 UNIT/ML IJ SOLN
2.0000 [IU] | Freq: Three times a day (TID) | INTRAMUSCULAR | Status: DC
  Administered 2024-01-17 – 2024-01-20 (×10): 2 [IU] via SUBCUTANEOUS

## 2024-01-17 MED ORDER — ISOSORBIDE MONONITRATE ER 30 MG PO TB24
30.0000 mg | ORAL_TABLET | Freq: Every day | ORAL | Status: DC
  Administered 2024-01-17 – 2024-01-20 (×4): 30 mg via ORAL
  Filled 2024-01-17 (×4): qty 1

## 2024-01-17 MED ORDER — INSULIN GLARGINE-YFGN 100 UNIT/ML ~~LOC~~ SOLN
20.0000 [IU] | Freq: Every day | SUBCUTANEOUS | Status: DC
  Administered 2024-01-17 – 2024-01-20 (×4): 20 [IU] via SUBCUTANEOUS
  Filled 2024-01-17 (×4): qty 0.2

## 2024-01-17 NOTE — Progress Notes (Signed)
Routine EEG complete. Results pending.

## 2024-01-17 NOTE — Progress Notes (Addendum)
 PROGRESS NOTE                                                                                                                                                                                                             Patient Demographics:    Adam Beasley, is a 58 y.o. male, DOB - 1966/04/10, FMW:[id number]  Outpatient Primary MD for the patient is [name], MD    LOS - 5  Admit date - 01/12/2024    Chief Complaint  Patient presents with   Cough       Brief Narrative (HPI from H&P)   58 yo male [facility] with c/o n/v/d x 4 days. Pt also reported sob and general fatigue at time of presentation. He denies documented fever/chills, no accompanying abdominal pain. Upon presentation pt was noted to actually have fever, was tachycardic and noted to have elevated liver enzymes with normal lactate as well as in acute renal failure with BUN/Cr ~50/>8. He was subsequently transferred to [facility], he developed renal failure requiring dialysis, nephrology was consulted he was diagnosed with Legionella pneumonia, transaminitis, he was admitted to ICU, seen by GI, nephrology underwent CRRT, he also had a seizure during this hospital stay.  Transferred to my care on 01/17/2024 on day 5 of hospital stay    Significant Hospital Events: Including procedures, antibiotic start and stop dates in addition to other pertinent events   7/27 - Presented to [facility], transferred to [facility] for admission, later transferred to ICU for initiation of crrt.  7/29 - CRRT discontinued 7/30 - legionella positive 01/17/2024.  EEG 01/17/2024 MRI brain     Subjective:    Adam Beasley today has, No headache, No chest pain, No abdominal pain - No Nausea, No new weakness tingling or numbness, shortness of breath   Assessment  & Plan :    Acute hypoxic respiratory failure, sepsis, Legionella pneumonia all present upon admission.  Seen by PCCM,  treated with empiric antibiotics completed 10-day course of azithromycin , sepsis pathophysiology has resolved currently on room air and improving continue to monitor encouraged to sit in chair use I-S and flutter valve.   Rhabdomyolysis with AKI.  Nephrology on board, has left IJ dialysis catheter, underwent CRRT, defer management to nephrology service, electrophoresis results per Dr. Melia suggest atypical pattern.  Seizure initially during  his hospital stay.  Discussed with neurology, will get MRI brain and EEG.  Currently seizure-free, likely due to metabolic derangements.  No headache or focal deficits, head CT stable.  Asymptomatic transaminitis.  Likely due to combination of rhabdomyolysis, Legionella pneumonia and over-the-counter herbal supplements, appreciate GI input seen by [facility].  Trend and monitor.  Suspected OSA.  Outpatient sleep study and pulmonary follow-up postdischarge.  Hypertension.  Currently on Norvasc  and prazosin, add hydralazine  as needed.  Dyslipidemia.  Due to transaminitis statin on hold.  DM type II.  On Semglee  and sliding scale.  Will adjust further on 01/17/2024 for better control, monitor.  Lab Results  Component Value Date   HGBA1C 7.6 (H) 01/12/2024   CBG (last 3)  Recent Labs    01/16/24 0747 01/16/24 1129 01/16/24 1540  GLUCAP 264* 325* 290*        Condition - Extremely Guarded  Family Communication  : Wife bedside on 01/17/2024  Code Status : Code  Consults  : PCCM, nephrology, gastroenterology  PUD Prophylaxis :    Procedures  :            Disposition Plan  :    Status is: Inpatient   DVT Prophylaxis  :    heparin  injection 5,000 Units Start: 01/14/24 1400 SCDs Start: 01/12/24 1655    Lab Results  Component Value Date   PLT 436 (H) 01/17/2024    Diet :  Diet Order             Diet regular Room service appropriate? Yes; Fluid consistency: Thin  Diet effective now                    Inpatient  Medications  Scheduled Meds:  amLODipine   10 mg Oral Daily   azithromycin   500 mg Oral Daily   Chlorhexidine  Gluconate Cloth  6 each Topical Daily   doxazosin   2 mg Oral Daily   feeding supplement  237 mL Oral TID BM   heparin   5,000 Units Subcutaneous Q8H   insulin  aspart  0-15 Units Subcutaneous TID WC   insulin  glargine-yfgn  10 Units Subcutaneous Daily   multivitamin  1 tablet Oral QHS   Continuous Infusions: PRN Meds:.guaiFENesin , heparin , hydrALAZINE , morphine  injection, mouth rinse     Objective:   Vitals:   01/16/24 2357 01/17/24 0000 01/17/24 0305 01/17/24 0439  BP: 133/81 137/78 (!) 143/86   Pulse: 85 82 83   Resp: (!) 24 20 19    Temp: 98.2 F (36.8 C)  98.4 F (36.9 C)   TempSrc: Oral  Oral   SpO2: 90% 96% 90%   Weight:    111 kg  Height:        Wt Readings from Last 3 Encounters:  01/17/24 111 kg  12/05/23 111.8 kg  10/25/23 106.6 kg     Intake/Output Summary (Last 24 hours) at 01/17/2024 0756 Last data filed at 01/16/2024 1900 Gross per 24 hour  Intake 960 ml  Output 1600 ml  Net -640 ml     Physical Exam  Awake Alert, No new F.N deficits, Normal affect, left IJ dialysis catheter in place [place].AT,PERRAL Supple Neck, No JVD,   Symmetrical Chest wall movement, Good air movement bilaterally, CTAB RRR,No Gallops,Rubs or new Murmurs,  +ve B.Sounds, Abd Soft, No tenderness,   No Cyanosis, Clubbing or edema       Data Review:    Recent Labs  Lab 01/12/24 1119 01/12/24 1200 01/13/24 0512 01/14/24 0914 01/15/24  9573 01/16/24 0426 01/17/24 0630  WBC 7.5  --  6.3 7.2 6.3 6.2 7.2  HGB 11.7*   < > 10.7* 11.3* 10.5* 9.5* 10.2*  HCT 32.5*   < > 29.4* 31.7* 29.4* 27.6* 29.4*  PLT 177  --  179 201 238 305 436*  MCV 81.7  --  82.1 83.4 83.8 85.7 84.7  MCH 29.4  --  29.9 29.7 29.9 29.5 29.4  MCHC 36.0  --  36.4* 35.6 35.7 34.4 34.7  RDW 13.4  --  13.7 13.9 13.8 13.9 13.7  LYMPHSABS 0.8  --   --   --   --   --   --   MONOABS 0.7  --   --   --   --    --   --   EOSABS 0.0  --   --   --   --   --   --   BASOSABS 0.0  --   --   --   --   --   --    < > = values in this interval not displayed.    Recent Labs  Lab 01/12/24 0951 01/12/24 0959 01/12/24 1119 01/12/24 1200 01/12/24 1341 01/12/24 1440 01/12/24 1441 01/12/24 1445 01/12/24 2049 01/13/24 0512 01/13/24 0514 01/14/24 0752 01/14/24 0753 01/14/24 1614 01/15/24 0426 01/15/24 1714 01/16/24 0426 01/16/24 1641  NA 127*  --   --    < > 129*  --    < >  --   --  133*   < >  --   --  133* 131*  131* 130* 130* 131*  K 3.6  --   --    < > 3.7  --    < >  --   --  3.4*   < >  --   --  3.6 3.4*  3.4* 3.4* 3.5 3.5  CL 91*  --   --   --  95*  --   --   --   --  100   < >  --   --  97* 99  99 96* 96* 97*  CO2 17*  --   --   --  14*  --   --   --   --  17*   < >  --   --  24 22  22 22 23 22   ANIONGAP 18*  --   --   --  20*  --   --   --   --  16*   < >  --   --  12 10  10 12 11 12   GLUCOSE 328*  --   --   --  305*  --   --   --   --  264*   < >  --   --  238* 215*  214* 286* 281* 340*  BUN 51*  --   --   --  51*  --   --   --   --  64*   < >  --   --  31* 38*  37* 50* 57* 65*  CREATININE 8.69*  --   --   --  8.81*  --   --   --   --  10.14*   < >  --   --  4.99* 5.83*  5.74* 7.02* 7.87* 7.92*  AST 834*  --  830*  --   --   --   --   --   --  437*  --  269*  --   --  195*  --   --   --   ALT 263*  --  264*  --   --   --   --   --   --  167*  --  136*  --   --  113*  --   --   --   ALKPHOS 78  --  77  --   --   --   --   --   --  66  --  61  --   --  61  --   --   --   BILITOT 2.2*  --  2.1*  --   --   --   --   --   --  1.4*  --  1.6*  --   --  1.4*  --   --   --   ALBUMIN 3.6  --  3.6  --   --   --   --   --   --  2.3*   < > 2.4*  --  2.4* 2.3*  2.3* 2.2* 2.1* 2.2*  CRP  --   --   --   --   --  31.7*  --   --   --   --   --   --   --   --   --   --   --   --   PROCALCITON  --   --   --   --   --   --   --   --  17.42  --   --   --   --   --   --   --   --   --   LATICACIDVEN   --  1.6  --   --   --   --   --  1.6  --   --   --   --   --   --   --   --   --   --   INR  --   --  1.1  --   --   --   --   --   --  1.2  --   --   --   --   --   --   --   --   HGBA1C  --   --   --   --   --   --   --   --  7.6*  --   --   --   --   --   --   --   --   --   AMMONIA  --   --   --   --   --   --   --   --   --  46*  --   --   --   --   --   --   --   --   MG  --   --   --   --  2.0  --   --   --   --  2.4  --   --  2.4  --  2.4  --  2.5*  --   PHOS  --   --   --   --   --   --   --   --   --   --    < >  --   --  3.0 1.9* 1.9* 3.5 3.4  CALCIUM 8.6*  --   --   --  7.9*  --   --   --   --  7.1*   < >  --   --  7.5* 7.8*  7.9* 7.7* 8.0* 8.2*   < > = values in this interval not displayed.      Recent Labs  Lab 01/12/24 0959 01/12/24 1119 01/12/24 1341 01/12/24 1440 01/12/24 1445 01/12/24 2049 01/13/24 0512 01/13/24 0514 01/14/24 0753 01/14/24 1614 01/15/24 0426 01/15/24 1714 01/16/24 0426 01/16/24 1641  CRP  --   --   --  31.7*  --   --   --   --   --   --   --   --   --   --   PROCALCITON  --   --   --   --   --  17.42  --   --   --   --   --   --   --   --   LATICACIDVEN 1.6  --   --   --  1.6  --   --   --   --   --   --   --   --   --   INR  --  1.1  --   --   --   --  1.2  --   --   --   --   --   --   --   HGBA1C  --   --   --   --   --  7.6*  --   --   --   --   --   --   --   --   AMMONIA  --   --   --   --   --   --  46*  --   --   --   --   --   --   --   MG  --   --  2.0  --   --   --  2.4  --  2.4  --  2.4  --  2.5*  --   CALCIUM  --   --  7.9*  --   --   --  7.1*   < >  --  7.5* 7.8*  7.9* 7.7* 8.0* 8.2*   < > = values in this interval not displayed.    --------------------------------------------------------------------------------------------------------------- No results found for: CHOL, HDL, LDLCALC, LDLDIRECT, TRIG, CHOLHDL  Lab Results  Component Value Date   HGBA1C 7.6 (H) 01/12/2024      Micro Results Recent Results  (from the past 240 hours)  MRSA Next Gen by PCR, Nasal     Status: None   Collection Time: 01/12/24 12:14 AM   Specimen: Nasal Mucosa; Nasal Swab  Result Value Ref Range Status   MRSA by PCR Next Gen NOT DETECTED NOT DETECTED Final    Comment: (NOTE) The GeneXpert MRSA Assay (FDA approved for NASAL specimens only), is one component of a comprehensive MRSA colonization surveillance program. It is not intended to diagnose MRSA infection nor to guide or monitor treatment for MRSA infections. Test performance is not FDA approved in patients less than 92 years old. Performed at [facility], [address], [place], [place] [address]   Blood Culture (routine x 2)     Status: None (Preliminary result)   Collection Time: 01/12/24 10:01 AM  Specimen: BLOOD  Result Value Ref Range Status   Specimen Description   Final    BLOOD RIGHT ANTECUBITAL Performed at [facility], [address] [place], [place] [address]    Special Requests   Final    BOTTLES DRAWN AEROBIC AND ANAEROBIC Blood Culture adequate volume Performed at [facility], [address] [place], [place] [address]    Culture   Final    NO GROWTH 4 DAYS Performed at [facility], [address] [place], [place] [address]    Report Status PENDING  Incomplete  Blood Culture (routine x 2)     Status: None (Preliminary result)   Collection Time: 01/12/24 10:12 AM   Specimen: BLOOD  Result Value Ref Range Status   Specimen Description   Final    BLOOD LEFT ANTECUBITAL Performed at [facility], [address] [place], [place] [address]    Special Requests   Final    BOTTLES DRAWN AEROBIC AND ANAEROBIC Blood Culture adequate volume Performed at [facility], [address] [place], [place] [address]    Culture   Final    NO GROWTH 4 DAYS Performed at [facility], [address] [place], [place] [address]    Report Status PENDING  Incomplete  Resp panel  by RT-PCR (RSV, Flu A&B, Covid) Anterior Nasal Swab     Status: None   Collection Time: 01/12/24 12:30 PM   Specimen: Anterior Nasal Swab  Result Value Ref Range Status   SARS Coronavirus 2 by RT PCR NEGATIVE NEGATIVE Final    Comment: (NOTE) SARS-CoV-2 target nucleic acids are NOT DETECTED.  The SARS-CoV-2 RNA is generally detectable in upper respiratory specimens during the acute phase of infection. The lowest concentration of SARS-CoV-2 viral copies this assay can detect is 138 copies/mL. A negative result does not preclude SARS-Cov-2 infection and should not be used as the sole basis for treatment or other patient management decisions. A negative result may occur with  improper specimen collection/handling, submission of specimen other than nasopharyngeal swab, presence of viral mutation(s) within the areas targeted by this assay, and inadequate number of viral copies(<138 copies/mL). A negative result must be combined with clinical observations, patient history, and epidemiological information. The expected result is Negative.  Fact Sheet for Patients:  BloggerCourse.com  Fact Sheet for Healthcare Providers:  SeriousBroker.it  This test is no t yet approved or cleared by the [place]  FDA and  has been authorized for detection and/or diagnosis of SARS-CoV-2 by FDA under an Emergency Use Authorization (EUA). This EUA will remain  in effect (meaning this test can be used) for the duration of the COVID-19 declaration under Section 564(b)(1) of the Act, 21 U.S.C.section 360bbb-3(b)(1), unless the authorization is terminated  or revoked sooner.       Influenza A by PCR NEGATIVE NEGATIVE Final   Influenza B by PCR NEGATIVE NEGATIVE Final    Comment: (NOTE) The Xpert Xpress SARS-CoV-2/FLU/RSV plus assay is intended as an aid in the diagnosis of influenza from Nasopharyngeal swab specimens and should not be used as a sole  basis for treatment. Nasal washings and aspirates are unacceptable for Xpert Xpress SARS-CoV-2/FLU/RSV testing.  Fact Sheet for Patients: BloggerCourse.com  Fact Sheet for Healthcare Providers: SeriousBroker.it  This test is not yet approved or cleared by the [place]  FDA and has been authorized for detection and/or diagnosis of SARS-CoV-2 by FDA under an Emergency  Use Authorization (EUA). This EUA will remain in effect (meaning this test can be used) for the duration of the COVID-19 declaration under Section 564(b)(1) of the Act, 21 U.S.C. section 360bbb-3(b)(1), unless the authorization is terminated or revoked.     Resp Syncytial Virus by PCR NEGATIVE NEGATIVE Final    Comment: (NOTE) Fact Sheet for Patients: BloggerCourse.com  Fact Sheet for Healthcare Providers: SeriousBroker.it  This test is not yet approved or cleared by the [place]  FDA and has been authorized for detection and/or diagnosis of SARS-CoV-2 by FDA under an Emergency Use Authorization (EUA). This EUA will remain in effect (meaning this test can be used) for the duration of the COVID-19 declaration under Section 564(b)(1) of the Act, 21 U.S.C. section 360bbb-3(b)(1), unless the authorization is terminated or revoked.  Performed at [facility], [address] [place], [place] [address]   Urine Culture (for pregnant, neutropenic or urologic patients or patients with an indwelling urinary catheter)     Status: None   Collection Time: 01/12/24  5:00 PM   Specimen: Urine, Clean Catch  Result Value Ref Range Status   Specimen Description URINE, CLEAN CATCH  Final   Special Requests NONE  Final   Culture   Final    NO GROWTH Performed at [facility], [address], [place], [place] [address]    Report Status 01/14/2024 FINAL  Final  Gastrointestinal Panel by PCR , Stool      Status: None   Collection Time: 01/13/24  8:50 AM   Specimen: Stool  Result Value Ref Range Status   Campylobacter species NOT DETECTED NOT DETECTED Final   Plesimonas shigelloides NOT DETECTED NOT DETECTED Final   Salmonella species NOT DETECTED NOT DETECTED Final   Yersinia enterocolitica NOT DETECTED NOT DETECTED Final   Vibrio species NOT DETECTED NOT DETECTED Final   Vibrio cholerae NOT DETECTED NOT DETECTED Final   Enteroaggregative E coli (EAEC) NOT DETECTED NOT DETECTED Final   Enteropathogenic E coli (EPEC) NOT DETECTED NOT DETECTED Final   Enterotoxigenic E coli (ETEC) NOT DETECTED NOT DETECTED Final   Shiga like toxin producing E coli (STEC) NOT DETECTED NOT DETECTED Final   Shigella/Enteroinvasive E coli (EIEC) NOT DETECTED NOT DETECTED Final   Cryptosporidium NOT DETECTED NOT DETECTED Final   Cyclospora cayetanensis NOT DETECTED NOT DETECTED Final   Entamoeba histolytica NOT DETECTED NOT DETECTED Final   Giardia lamblia NOT DETECTED NOT DETECTED Final   Adenovirus F40/41 NOT DETECTED NOT DETECTED Final   Astrovirus NOT DETECTED NOT DETECTED Final   Norovirus GI/GII NOT DETECTED NOT DETECTED Final   Rotavirus A NOT DETECTED NOT DETECTED Final   Sapovirus (I, II, IV, and V) NOT DETECTED NOT DETECTED Final    Comment: Performed at [facility], [address] [place], [place] [address]  Respiratory (~20 pathogens) panel by PCR     Status: None   Collection Time: 01/14/24  8:09 AM   Specimen: Nasopharyngeal Swab; Respiratory  Result Value Ref Range Status   Adenovirus NOT DETECTED NOT DETECTED Final   Coronavirus 229E NOT DETECTED NOT DETECTED Final    Comment: (NOTE) The Coronavirus on the Respiratory Panel, DOES NOT test for the novel  Coronavirus (2019 nCoV)    Coronavirus HKU1 NOT DETECTED NOT DETECTED Final   Coronavirus NL63 NOT DETECTED NOT DETECTED Final   Coronavirus OC43 NOT DETECTED NOT DETECTED Final   Metapneumovirus NOT DETECTED NOT DETECTED  Final   Rhinovirus / Enterovirus NOT  DETECTED NOT DETECTED Final   Influenza A NOT DETECTED NOT DETECTED Final   Influenza B NOT DETECTED NOT DETECTED Final   Parainfluenza Virus 1 NOT DETECTED NOT DETECTED Final   Parainfluenza Virus 2 NOT DETECTED NOT DETECTED Final   Parainfluenza Virus 3 NOT DETECTED NOT DETECTED Final   Parainfluenza Virus 4 NOT DETECTED NOT DETECTED Final   Respiratory Syncytial Virus NOT DETECTED NOT DETECTED Final   Bordetella pertussis NOT DETECTED NOT DETECTED Final   Bordetella Parapertussis NOT DETECTED NOT DETECTED Final   Chlamydophila pneumoniae NOT DETECTED NOT DETECTED Final   Mycoplasma pneumoniae NOT DETECTED NOT DETECTED Final    Comment: Performed at [facility], [address], [place], [place] [address]    Radiology Report DG Chest [place] 1 View Result Date: 01/17/2024 CLINICAL DATA:  58 year old male with cough and shortness of breath. EXAM: PORTABLE CHEST 1 VIEW COMPARISON:  Chest CT 01/12/2024, portable chest 01/13/2024, and earlier. FINDINGS: Portable AP semi upright view at 0728 hours. Left lung pneumonia with airspace consolidation on the recent CT. Ventilation does not appear significantly improved since that time. Ongoing confluent peripheral left lung opacity. No definite pleural effusion. Stable cardiac size and mediastinal contours. Stable left IJ vascular catheter. Right lung appears stable and negative. Paucity of bowel gas. No acute osseous abnormality identified. IMPRESSION: Unresolved Left Lung Pneumonia since CT on 01/12/2024. No new cardiopulmonary abnormality. Electronically Signed   By: VEAR Hurst M.D.   On: 01/17/2024 07:51     Signature  -   Lavada Stank M.D on 01/17/2024 at 7:56 AM   -  To page go to [email or web address]

## 2024-01-17 NOTE — Procedures (Signed)
 Patient Name: Adam Beasley  MRN: [id number]  Epilepsy Attending: Pastor Falling  Referring Physician/Provider: No ref. provider found      Date: 01/17/2024 Duration: 23 minutes   Patient history: 58 year old past medical history of diabetes mellitus type 2 and hypertension presenting at bedside or [place] acutely ill with nausea vomiting diarrhea x 4 days along with shortness of breath and fatigue.   Level of alertness: Awake, drowsy  AEDs during EEG study:   Technical aspects: This EEG study was done with scalp electrodes positioned according to the 10-20 International system of electrode placement. Electrical activity was reviewed with band pass filter of 1-70Hz , sensitivity of 7 uV/mm, display speed of 15mm/sec with a 60Hz  notched filter applied as appropriate. EEG data were recorded continuously and digitally stored.  Video monitoring was available and reviewed as appropriate.  Description: The posterior dominant rhythm consists of 9 Hz activity of moderate voltage (25-35 uV) seen predominantly in posterior head regions, symmetric and reactive to eye opening and eye closing. Drowsiness was characterized by attenuation of the posterior background rhythm. Sleep was not seen. Hyperventilation and photic stimulation were not performed.     ABNORMALITY -None  IMPRESSION: This study is within normal limits. No seizures or epileptiform discharges were seen throughout the recording. A normal interictal EEG does not exclude nor support the diagnosis of epilepsy.   Pastor Falling MD Neurology

## 2024-01-17 NOTE — Progress Notes (Signed)
 Physical Therapy Treatment Patient Details Name: Adam Beasley MRN: [id number] DOB: 1966-03-22 Today's Date: 01/17/2024   History of Present Illness 58 y.o. male PMH: presenting from [facility] 7/27 acutely ill with nausea vomiting diarrhea x 4 days along with shortness of breath and fatigue. Experienced 5 min seizure transferred to ICU and placed on CRRT. Receiving treatment for AKI, acute liver injury, multilobular PNA caused by legionella. PMH: essential hypertension, diabetes mellitus type 2    PT Comments  Pt received in supine and agreeable to session. Pt reports no adverse symptoms throughout session and demonstrates improved activity tolerance. Pt's BP noted to drop some upon initial stand, but remains stable after gait trial (see general comments). Pt's HR noted to elevate to 141bpm with ambulation. Pt able to complete x10 serial STS without UE support and no LOB. Pt continues to benefit from PT services to progress toward functional mobility goals.     If plan is discharge home, recommend the following: A little help with walking and/or transfers;A little help with bathing/dressing/bathroom;Assistance with cooking/housework;Assist for transportation;Help with stairs or ramp for entrance   Can travel by private vehicle        Equipment Recommendations  None recommended by PT    Recommendations for Other Services       Precautions / Restrictions Precautions Precautions: None Restrictions Weight Bearing Restrictions Per Provider Order: No     Mobility  Bed Mobility Overal bed mobility: Needs Assistance Bed Mobility: Supine to Sit     Supine to sit: HOB elevated, Used rails, Contact guard     General bed mobility comments: increased time    Transfers Overall transfer level: Needs assistance Equipment used: None Transfers: Sit to/from Stand Sit to Stand: Supervision, Contact guard assist           General transfer comment: STS from EOB with CGA  progressing to supervision without UE support    Ambulation/Gait Ambulation/Gait assistance: Contact guard assist Gait Distance (Feet): 150 Feet Assistive device: None Gait Pattern/deviations: Step-through pattern, Decreased stride length Gait velocity: slowed     General Gait Details: slight instability initially without UE support, but improving with progressed distance. CGA for safety   Stairs             Wheelchair Mobility     Tilt Bed    Modified Rankin (Stroke Patients Only)       Balance Overall balance assessment: Mild deficits observed, not formally tested                                          Communication Communication Communication: No apparent difficulties  Cognition Arousal: Alert Behavior During Therapy: WFL for tasks assessed/performed   PT - Cognitive impairments: No apparent impairments                         Following commands: Intact      Cueing Cueing Techniques: Verbal cues, Gestural cues, Tactile cues  Exercises Other Exercises Other Exercises: x10 serial STS without UE support    General Comments General comments (skin integrity, edema, etc.): HR up to 141bpm during ambulation. BP sitting EOB: 133/83; standing: 105/62; after gait trial: 144/90      Pertinent Vitals/Pain Pain Assessment Pain Assessment: No/denies pain     PT Goals (current goals can now be found in the care plan section) Acute Rehab  PT Goals PT Goal Formulation: With patient/family Time For Goal Achievement: 01/30/24 Progress towards PT goals: Progressing toward goals    Frequency    Min 2X/week       AM-PAC PT 6 Clicks Mobility   Outcome Measure  Help needed turning from your back to your side while in a flat bed without using bedrails?: None Help needed moving from lying on your back to sitting on the side of a flat bed without using bedrails?: A Little Help needed moving to and from a bed to a chair  (including a wheelchair)?: A Little Help needed standing up from a chair using your arms (e.g., wheelchair or bedside chair)?: A Little Help needed to walk in hospital room?: A Little Help needed climbing 3-5 steps with a railing? : A Little 6 Click Score: 19    End of Session Equipment Utilized During Treatment: Gait belt Activity Tolerance: Patient tolerated treatment well Patient left: in bed;with call bell/phone within reach;with family/visitor present Nurse Communication: Mobility status PT Visit Diagnosis: Unsteadiness on feet (R26.81);Muscle weakness (generalized) (M62.81);Other (comment)     Time: 8558-8498 PT Time Calculation (min) (ACUTE ONLY): 20 min  Charges:    $Gait Training: 8-22 mins PT General Charges $$ ACUTE PT VISIT: 1 Visit                     Darryle George, PTA Acute Rehabilitation Services Secure Chat Preferred  [phone]    Darryle George 01/17/2024, 3:52 PM

## 2024-01-17 NOTE — Plan of Care (Signed)
   Problem: Activity: Goal: Risk for activity intolerance will decrease Outcome: Progressing   Problem: Nutrition: Goal: Adequate nutrition will be maintained Outcome: Progressing   Problem: Coping: Goal: Level of anxiety will decrease Outcome: Progressing   Problem: Elimination: Goal: Will not experience complications related to bowel motility Outcome: Progressing

## 2024-01-17 NOTE — Progress Notes (Signed)
 Adam Beasley is an 58 y.o. male w/ PMH as below who presented early 7/27 morning c/o cough, diarrhea and N/V for the last 4 days. Also SOB and fatigue. In the ED temp 103, RR 20, HR 121, BP 160/90. Robinson 2 L at 93%.  Labs showed Na 130, K 3.6, CO2 14, bun 51, creat 8.81 (0.9 in may 2025) with hosp course complicated by seizures, CT showed new consolidation in LUL and LLL.  Assessment/Plan: AKI, severe: w/ normal b/l creat 0.9- 1.0 from jan and may 2025, eGFR 96 ml/min. Creat here is 8.6 in the setting of an acute resp illness x 1 week, w/ multifocal PNA most likely per CT. No acei/ ARB at home, BP's stable here. No nephrotoxins. On exam looks euvolemic, possibly a bit dry. CT showed bilat kidneys w/o obstruction. UA showed ^protein but no rbcs/ wbcs. Patient just had a significant seizure episode, this could possibly due to uremia. Not sure cause of AKI, vol depletion probably played a role initially but likely ATN from rhabdo (CPK 10k). Due to the seizures, which could be uremia-related, pt started on CRRT 7/27-29.  - Renal function may possibly be stabilizing; urine output very good over the past 24 hours and from 7 AM today he has already had 1.9 L urine output.  Almost certainly in the polyuric phase of ATN.    Hopefully he enters the recovery phase in the next 24 hours but will continue to hold dialysis.  Spouse who is bedside updated.    -Monitor Daily I/Os, Daily weight  -Maintain MAP>65 for optimal renal perfusion.  - Avoid nephrotoxic agents such as IV contrast, NSAIDs, and phosphate containing bowel preps (FLEETS)  Multifocal PNA: per CT, on IV abx per pmd. +Legionella. Acute seizures: tonight, RR rx'd w/ IV ativan .   Subjective: Spouse bedside; appetite better, urine in the foley much lighter    Chemistry and CBC: Creat  Date/Time Value Ref Range Status  07/16/2023 03:15 PM 0.93 0.70 - 1.30 mg/dL Final  87/97/7975 96:47 PM 0.95 0.70 - 1.30 mg/dL Final   Creatinine, Ser  Date/Time  Value Ref Range Status  01/17/2024 06:30 AM 7.77 (H) 0.61 - 1.24 mg/dL Final  92/68/7974 95:58 PM 7.92 (H) 0.61 - 1.24 mg/dL Final  92/68/7974 95:73 AM 7.87 (H) 0.61 - 1.24 mg/dL Final  92/69/7974 94:85 PM 7.02 (H) 0.61 - 1.24 mg/dL Final  92/69/7974 95:73 AM 5.74 (H) 0.61 - 1.24 mg/dL Final  92/69/7974 95:73 AM 5.83 (H) 0.61 - 1.24 mg/dL Final  92/70/7974 95:85 PM 4.99 (H) 0.61 - 1.24 mg/dL Final  92/70/7974 94:99 AM 5.46 (H) 0.61 - 1.24 mg/dL Final  92/71/7974 95:91 PM 6.98 (H) 0.61 - 1.24 mg/dL Final  92/71/7974 94:85 AM 10.17 (H) 0.61 - 1.24 mg/dL Final  92/71/7974 94:87 AM 10.14 (H) 0.61 - 1.24 mg/dL Final  92/72/7974 98:58 PM 8.81 (H) 0.61 - 1.24 mg/dL Final  92/72/7974 90:48 AM 8.69 (H) 0.61 - 1.24 mg/dL Final  94/90/7974 97:80 PM 1.06 0.61 - 1.24 mg/dL Final  98/72/7983 88:59 PM 1.04 0.50 - 1.35 mg/dL Final   Recent Labs  Lab 01/14/24 0500 01/14/24 1614 01/15/24 0426 01/15/24 1714 01/16/24 0426 01/16/24 1641 01/17/24 0630  NA 132* 133* 131*  131* 130* 130* 131* 133*  K 3.1* 3.6 3.4*  3.4* 3.4* 3.5 3.5 3.4*  CL 93* 97* 99  99 96* 96* 97* 97*  CO2 26 24 22  22 22 23 22  21*  GLUCOSE 147* 238* 215*  214* 286*  281* 340* 237*  BUN 35* 31* 38*  37* 50* 57* 65* 73*  CREATININE 5.46* 4.99* 5.83*  5.74* 7.02* 7.87* 7.92* 7.77*  CALCIUM 7.3* 7.5* 7.8*  7.9* 7.7* 8.0* 8.2* 8.4*  PHOS 2.7 3.0 1.9* 1.9* 3.5 3.4 4.3   Recent Labs  Lab 01/12/24 1119 01/12/24 1200 01/14/24 0914 01/15/24 0426 01/16/24 0426 01/17/24 0630  WBC 7.5   < > 7.2 6.3 6.2 7.2  NEUTROABS 5.8  --   --   --   --   --   HGB 11.7*   < > 11.3* 10.5* 9.5* 10.2*  HCT 32.5*   < > 31.7* 29.4* 27.6* 29.4*  MCV 81.7   < > 83.4 83.8 85.7 84.7  PLT 177   < > 201 238 305 436*   < > = values in this interval not displayed.   Liver Function Tests: Recent Labs  Lab 01/13/24 0512 01/13/24 0514 01/14/24 0752 01/14/24 1614 01/15/24 0426 01/15/24 1714 01/16/24 0426 01/16/24 1641 01/17/24 0630  AST 437*   --  269*  --  195*  --   --   --   --   ALT 167*  --  136*  --  113*  --   --   --   --   ALKPHOS 66  --  61  --  61  --   --   --   --   BILITOT 1.4*  --  1.6*  --  1.4*  --   --   --   --   PROT 5.9*  --  6.2*  --  5.8*  --   --   --   --   ALBUMIN 2.3*   < > 2.4*   < > 2.3*  2.3*   < > 2.1* 2.2* 2.4*   < > = values in this interval not displayed.   Recent Labs  Lab 01/12/24 2049  LIPASE 163*   Recent Labs  Lab 01/13/24 0512  AMMONIA 46*   Cardiac Enzymes: Recent Labs  Lab 01/12/24 2049 01/13/24 0512 01/14/24 0752 01/15/24 0426  CKTOTAL 13,277* 10,078* 3,761* 4,013*   Iron Studies: No results for input(s): IRON, TIBC, TRANSFERRIN, FERRITIN in the last 72 hours. PT/INR: @LABRCNTIP (inr:5)  Xrays/Other Studies: ) Results for orders placed or performed during the hospital encounter of 01/12/24 (from the past 48 hours)  Glucose, capillary     Status: Abnormal   Collection Time: 01/15/24  3:28 PM  Result Value Ref Range   Glucose-Capillary 276 (H) 70 - 99 mg/dL    Comment: Glucose reference range applies only to samples taken after fasting for at least 8 hours.  Renal function panel (daily at 1600)     Status: Abnormal   Collection Time: 01/15/24  5:14 PM  Result Value Ref Range   Sodium 130 (L) 135 - 145 mmol/L   Potassium 3.4 (L) 3.5 - 5.1 mmol/L   Chloride 96 (L) 98 - 111 mmol/L   CO2 22 22 - 32 mmol/L   Glucose, Bld 286 (H) 70 - 99 mg/dL    Comment: Glucose reference range applies only to samples taken after fasting for at least 8 hours.   BUN 50 (H) 6 - 20 mg/dL   Creatinine, Ser 2.97 (H) 0.61 - 1.24 mg/dL   Calcium 7.7 (L) 8.9 - 10.3 mg/dL   Phosphorus 1.9 (L) 2.5 - 4.6 mg/dL   Albumin 2.2 (L) 3.5 - 5.0 g/dL   GFR, Estimated 8 (L) >60  mL/min    Comment: (NOTE) Calculated using the CKD-EPI Creatinine Equation (2021)    Anion gap 12 5 - 15    Comment: Performed at [facility], [address], [place], [place] [address]  Glucose, capillary      Status: Abnormal   Collection Time: 01/15/24  7:37 PM  Result Value Ref Range   Glucose-Capillary 254 (H) 70 - 99 mg/dL    Comment: Glucose reference range applies only to samples taken after fasting for at least 8 hours.  Glucose, capillary     Status: Abnormal   Collection Time: 01/15/24 11:31 PM  Result Value Ref Range   Glucose-Capillary 267 (H) 70 - 99 mg/dL    Comment: Glucose reference range applies only to samples taken after fasting for at least 8 hours.  Protein / creatinine ratio, urine     Status: Abnormal   Collection Time: 01/16/24  4:15 AM  Result Value Ref Range   Creatinine, Urine 66 mg/dL   Total Protein, Urine 29 mg/dL    Comment: NO NORMAL RANGE ESTABLISHED FOR THIS TEST   Protein Creatinine Ratio 0.44 (H) 0.00 - 0.15 mg/mg[Cre]    Comment: Performed at [facility], [address], [place], [place] [address]  Renal function panel (daily at 0500)     Status: Abnormal   Collection Time: 01/16/24  4:26 AM  Result Value Ref Range   Sodium 130 (L) 135 - 145 mmol/L   Potassium 3.5 3.5 - 5.1 mmol/L   Chloride 96 (L) 98 - 111 mmol/L   CO2 23 22 - 32 mmol/L   Glucose, Bld 281 (H) 70 - 99 mg/dL    Comment: Glucose reference range applies only to samples taken after fasting for at least 8 hours.   BUN 57 (H) 6 - 20 mg/dL   Creatinine, Ser 2.12 (H) 0.61 - 1.24 mg/dL   Calcium 8.0 (L) 8.9 - 10.3 mg/dL   Phosphorus 3.5 2.5 - 4.6 mg/dL   Albumin 2.1 (L) 3.5 - 5.0 g/dL   GFR, Estimated 7 (L) >60 mL/min    Comment: (NOTE) Calculated using the CKD-EPI Creatinine Equation (2021)    Anion gap 11 5 - 15    Comment: Performed at [facility], [address], [place], [place] [address]  Magnesium     Status: Abnormal   Collection Time: 01/16/24  4:26 AM  Result Value Ref Range   Magnesium 2.5 (H) 1.7 - 2.4 mg/dL    Comment: Performed at [facility], [address] [place], [place] [address]  APTT     Status: Abnormal   Collection Time: 01/16/24  4:26 AM   Result Value Ref Range   aPTT 37 (H) 24 - 36 seconds    Comment:        IF BASELINE aPTT IS ELEVATED, SUGGEST PATIENT RISK ASSESSMENT BE USED TO DETERMINE APPROPRIATE ANTICOAGULANT THERAPY. Performed at [facility], [address], [place], [place] [address]   CBC     Status: Abnormal   Collection Time: 01/16/24  4:26 AM  Result Value Ref Range   WBC 6.2 4.0 - 10.5 K/uL   RBC 3.22 (L) 4.22 - 5.81 MIL/uL   Hemoglobin 9.5 (L) 13.0 - 17.0 g/dL   HCT 72.3 (L) 60.9 - 47.9 %   MCV 85.7 80.0 - 100.0 fL   MCH 29.5 26.0 - 34.0 pg   MCHC 34.4 30.0 - 36.0 g/dL   RDW 86.0 88.4 -  15.5 %   Platelets 305 150 - 400 K/uL   nRBC 0.0 0.0 - 0.2 %    Comment: Performed at [facility], [address] [place], [place] [address]  Glucose, capillary     Status: Abnormal   Collection Time: 01/16/24  7:47 AM  Result Value Ref Range   Glucose-Capillary 264 (H) 70 - 99 mg/dL    Comment: Glucose reference range applies only to samples taken after fasting for at least 8 hours.  Glucose, capillary     Status: Abnormal   Collection Time: 01/16/24 11:29 AM  Result Value Ref Range   Glucose-Capillary 325 (H) 70 - 99 mg/dL    Comment: Glucose reference range applies only to samples taken after fasting for at least 8 hours.  Glucose, capillary     Status: Abnormal   Collection Time: 01/16/24  3:40 PM  Result Value Ref Range   Glucose-Capillary 290 (H) 70 - 99 mg/dL    Comment: Glucose reference range applies only to samples taken after fasting for at least 8 hours.  Renal function panel (daily at 1600)     Status: Abnormal   Collection Time: 01/16/24  4:41 PM  Result Value Ref Range   Sodium 131 (L) 135 - 145 mmol/L   Potassium 3.5 3.5 - 5.1 mmol/L   Chloride 97 (L) 98 - 111 mmol/L   CO2 22 22 - 32 mmol/L   Glucose, Bld 340 (H) 70 - 99 mg/dL    Comment: Glucose reference range applies only to samples taken after fasting for at least 8 hours.   BUN 65 (H) 6 - 20 mg/dL   Creatinine, Ser  2.07 (H) 0.61 - 1.24 mg/dL   Calcium 8.2 (L) 8.9 - 10.3 mg/dL   Phosphorus 3.4 2.5 - 4.6 mg/dL   Albumin 2.2 (L) 3.5 - 5.0 g/dL   GFR, Estimated 7 (L) >60 mL/min    Comment: (NOTE) Calculated using the CKD-EPI Creatinine Equation (2021)    Anion gap 12 5 - 15    Comment: Performed at [facility], [address] [place], [place] [address]  Renal function panel (daily at 0500)     Status: Abnormal   Collection Time: 01/17/24  6:30 AM  Result Value Ref Range   Sodium 133 (L) 135 - 145 mmol/L   Potassium 3.4 (L) 3.5 - 5.1 mmol/L   Chloride 97 (L) 98 - 111 mmol/L   CO2 21 (L) 22 - 32 mmol/L   Glucose, Bld 237 (H) 70 - 99 mg/dL    Comment: Glucose reference range applies only to samples taken after fasting for at least 8 hours.   BUN 73 (H) 6 - 20 mg/dL   Creatinine, Ser 2.22 (H) 0.61 - 1.24 mg/dL   Calcium 8.4 (L) 8.9 - 10.3 mg/dL   Phosphorus 4.3 2.5 - 4.6 mg/dL   Albumin 2.4 (L) 3.5 - 5.0 g/dL   GFR, Estimated 7 (L) >60 mL/min    Comment: (NOTE) Calculated using the CKD-EPI Creatinine Equation (2021)    Anion gap 15 5 - 15    Comment: Performed at [facility], [address] [place], [place] [address]  Magnesium     Status: None   Collection Time: 01/17/24  6:30 AM  Result Value Ref Range   Magnesium 2.2 1.7 - 2.4 mg/dL    Comment: Performed at [facility], [address], [place], [place] [address]  APTT     Status:  None   Collection Time: 01/17/24  6:30 AM  Result Value Ref Range   aPTT 33 24 - 36 seconds    Comment: Performed at [facility], [address] [place], [place] [address]  CBC     Status: Abnormal   Collection Time: 01/17/24  6:30 AM  Result Value Ref Range   WBC 7.2 4.0 - 10.5 K/uL   RBC 3.47 (L) 4.22 - 5.81 MIL/uL   Hemoglobin 10.2 (L) 13.0 - 17.0 g/dL   HCT 70.5 (L) 60.9 - 47.9 %   MCV 84.7 80.0 - 100.0 fL   MCH 29.4 26.0 - 34.0 pg   MCHC 34.7 30.0 - 36.0 g/dL   RDW 86.2 88.4 - 84.4 %   Platelets 436 (H) 150 - 400 K/uL    nRBC 0.0 0.0 - 0.2 %    Comment: Performed at [facility], [address] [place], [place] [address]  Glucose, capillary     Status: Abnormal   Collection Time: 01/17/24  8:35 AM  Result Value Ref Range   Glucose-Capillary 243 (H) 70 - 99 mg/dL    Comment: Glucose reference range applies only to samples taken after fasting for at least 8 hours.   EEG adult Result Date: 01/17/2024 Gregg Lek, MD     01/17/2024 11:18 AM Patient Name: Adam Beasley MRN: [id number] Epilepsy Attending: Lek Gregg Referring Physician/Provider: No ref. provider found     Date: 01/17/2024 Duration: 23 minutes Patient history: 58 year old past medical history of diabetes mellitus type 2 and hypertension presenting at bedside or [place] acutely ill with nausea vomiting diarrhea x 4 days along with shortness of breath and fatigue. Level of alertness: Awake, drowsy AEDs during EEG study: Technical aspects: This EEG study was done with scalp electrodes positioned according to the 10-20 International system of electrode placement. Electrical activity was reviewed with band pass filter of 1-70Hz , sensitivity of 7 uV/mm, display speed of 22mm/sec with a 60Hz  notched filter applied as appropriate. EEG data were recorded continuously and digitally stored.  Video monitoring was available and reviewed as appropriate. Description: The posterior dominant rhythm consists of 9 Hz activity of moderate voltage (25-35 uV) seen predominantly in posterior head regions, symmetric and reactive to eye opening and eye closing. Drowsiness was characterized by attenuation of the posterior background rhythm. Sleep was not seen. Hyperventilation and photic stimulation were not performed.   ABNORMALITY -None IMPRESSION: This study is within normal limits. No seizures or epileptiform discharges were seen throughout the recording. A normal interictal EEG does not exclude nor support the diagnosis of epilepsy. Lek Gregg MD Neurology    DG Chest  Port 1 View Result Date: 01/17/2024 CLINICAL DATA:  58 year old male with cough and shortness of breath. EXAM: PORTABLE CHEST 1 VIEW COMPARISON:  Chest CT 01/12/2024, portable chest 01/13/2024, and earlier. FINDINGS: Portable AP semi upright view at 0728 hours. Left lung pneumonia with airspace consolidation on the recent CT. Ventilation does not appear significantly improved since that time. Ongoing confluent peripheral left lung opacity. No definite pleural effusion. Stable cardiac size and mediastinal contours. Stable left IJ vascular catheter. Right lung appears stable and negative. Paucity of bowel gas. No acute osseous abnormality identified. IMPRESSION: Unresolved Left Lung Pneumonia since CT on 01/12/2024. No new cardiopulmonary abnormality. Electronically Signed   By: VEAR Hurst M.D.   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 No pertinent surgical history.  Allergies: No Known Allergies  Medications:   Prior to Admission medications   Medication Sig Start Date End Date Taking? Authorizing Provider  amLODipine  (NORVASC ) 5 MG tablet Take 1 tablet (5 mg total) by mouth daily. 08/29/23  Yes Thapa, [place], MD  atorvastatin (LIPITOR) 80 MG tablet Take 80 mg by mouth daily.   Yes [provider]  doxazosin  (CARDURA ) 2 MG tablet Take 2 mg by mouth daily.   Yes [provider]  eplerenone  (INSPRA ) 50 MG tablet Take 1 tablet (50 mg total) by mouth daily. 08/28/23  Yes Thapa, [place], MD  metFORMIN (GLUCOPHAGE) 500 MG tablet Take 500 mg by mouth daily with breakfast.   Yes [provider]  azithromycin  (ZITHROMAX ) 250 MG tablet Take 1 tablet (250 mg total) by mouth daily. 07/15/14   Terryl Kubas, NP  chlorpheniramine-HYDROcodone [facility] PENNKINETIC ER) 10-8 MG/5ML LQCR Take 5 mLs by  mouth 2 (two) times daily. 07/15/14   Terryl Kubas, NP  doxazosin  (CARDURA ) 1 MG tablet Take 2 tablets (2 mg total) by mouth daily. Patient not taking: Reported on 01/12/2024 08/28/23   Thapa, [place], MD    Discontinued Meds:   Medications Discontinued During This Encounter  Medication Reason   acetaminophen  (TYLENOL ) tablet 650 mg    atorvastatin (LIPITOR) 40 MG tablet Discontinued by provider   ezetimibe (ZETIA) 10 MG tablet Discontinued by provider   doxazosin  (CARDURA ) 2 MG tablet Discontinued by provider   spironolactone  (ALDACTONE ) 100 MG tablet Discontinued by provider   verapamil  (CALAN -SR) 180 MG CR tablet Discontinued by provider   metFORMIN (GLUCOPHAGE) 500 MG tablet Discontinued by provider   lactated ringers  infusion    metroNIDAZOLE  (FLAGYL ) IVPB 500 mg    ceFEPIme (MAXIPIME) 2 g in sodium chloride  0.9 % 100 mL IVPB    famotidine  (PEPCID ) IVPB 20 mg premix    azithromycin  (ZITHROMAX ) 500 mg in sodium chloride  0.9 % 250 mL IVPB    metroNIDAZOLE  (FLAGYL ) IVPB 500 mg    LORazepam  (ATIVAN ) injection 2 mg    famotidine  (PEPCID ) tablet 20 mg    famotidine  (PEPCID ) tablet 20 mg    0.9 %  sodium chloride  infusion    metroNIDAZOLE  (FLAGYL ) tablet 500 mg    heparin  injection 5,000 Units    sodium bicarbonate  150 mEq in sterile water  1,150 mL infusion    hydrALAZINE  (APRESOLINE ) injection 10 mg    amLODipine  (NORVASC ) tablet 5 mg    heparin  10,000 units/ 20 mL infusion syringe    insulin  aspart (novoLOG ) injection 0-6 Units    prismasol  BGK 4/2.5 infusion    prismasol  BGK 4/2.5 infusion    prismasol  BGK 4/2.5 infusion    insulin  aspart (novoLOG ) injection 0-15 Units    insulin  glargine-yfgn (SEMGLEE ) injection 10 Units    hydrALAZINE  (APRESOLINE ) injection 20 mg     Social History:  reports that he has never smoked. He has never used smokeless tobacco. He reports that he does not drink alcohol. No history on file for drug use.  Family History:  History reviewed. No pertinent  family history.  Blood pressure (!) 147/93, pulse 97, temperature 98.4 F (36.9 C), temperature source Oral, resp. rate 15, height 6' (1.829 m), weight 111 kg, SpO2 95%. Physical Exam: Gen NAD, awake and conversant, [place]  O2 No rash, cyanosis or gangrene Sclera anicteric, throat clear  No jvd or  bruits Chest clear bilat to bases RRR no MRG Abd soft ntnd no mass or ascites +bs GU foley cath in place Ext no LE or UE edema, no other edema Access LIJ temp     MELIA LYNWOOD ORN, MD 01/17/2024, 12:05 PM

## 2024-01-17 NOTE — Plan of Care (Signed)
  Problem: Education: Goal: Knowledge of General Education information will improve Description: Including pain rating scale, medication(s)/side effects and non-pharmacologic comfort measures Outcome: Progressing   Problem: Health Behavior/Discharge Planning: Goal: Ability to manage health-related needs will improve Outcome: Progressing   Problem: Clinical Measurements: Goal: Ability to maintain clinical measurements within normal limits will improve Outcome: Progressing Goal: Will remain free from infection Outcome: Progressing Goal: Diagnostic test results will improve Outcome: Progressing Goal: Respiratory complications will improve Outcome: Progressing Goal: Cardiovascular complication will be avoided Outcome: Progressing   Problem: Activity: Goal: Risk for activity intolerance will decrease Outcome: Progressing   Problem: Nutrition: Goal: Adequate nutrition will be maintained Outcome: Progressing   Problem: Coping: Goal: Level of anxiety will decrease Outcome: Progressing   Problem: Elimination: Goal: Will not experience complications related to bowel motility Outcome: Progressing Goal: Will not experience complications related to urinary retention Outcome: Progressing   Problem: Pain Managment: Goal: General experience of comfort will improve and/or be controlled Outcome: Progressing   Problem: Safety: Goal: Ability to remain free from injury will improve Outcome: Progressing   Problem: Skin Integrity: Goal: Risk for impaired skin integrity will decrease Outcome: Progressing   Problem: Education: Goal: Ability to describe self-care measures that may prevent or decrease complications (Diabetes Survival Skills Education) will improve Outcome: Progressing Goal: Individualized Educational Video(s) Outcome: Progressing   Problem: Coping: Goal: Ability to adjust to condition or change in health will improve Outcome: Progressing   Problem: Fluid  Volume: Goal: Ability to maintain a balanced intake and output will improve Outcome: Progressing   Problem: Health Behavior/Discharge Planning: Goal: Ability to identify and utilize available resources and services will improve Outcome: Progressing Goal: Ability to manage health-related needs will improve Outcome: Progressing   Problem: Metabolic: Goal: Ability to maintain appropriate glucose levels will improve Outcome: Progressing   Problem: Nutritional: Goal: Maintenance of adequate nutrition will improve Outcome: Progressing   Problem: Skin Integrity: Goal: Risk for impaired skin integrity will decrease Outcome: Progressing   Problem: Tissue Perfusion: Goal: Adequacy of tissue perfusion will improve Outcome: Progressing   Problem: Fluid Volume: Goal: Hemodynamic stability will improve Outcome: Progressing   Problem: Clinical Measurements: Goal: Diagnostic test results will improve Outcome: Progressing Goal: Signs and symptoms of infection will decrease Outcome: Progressing   Problem: Respiratory: Goal: Ability to maintain adequate ventilation will improve Outcome: Progressing   Problem: Activity: Goal: Ability to tolerate increased activity will improve Outcome: Progressing   Problem: Clinical Measurements: Goal: Ability to maintain a body temperature in the normal range will improve Outcome: Progressing   Problem: Respiratory: Goal: Ability to maintain adequate ventilation will improve Outcome: Progressing Goal: Ability to maintain a clear airway will improve Outcome: Progressing

## 2024-01-18 DIAGNOSIS — N179 Acute kidney failure, unspecified: Secondary | ICD-10-CM | POA: Diagnosis not present

## 2024-01-18 DIAGNOSIS — J189 Pneumonia, unspecified organism: Secondary | ICD-10-CM | POA: Diagnosis not present

## 2024-01-18 DIAGNOSIS — R112 Nausea with vomiting, unspecified: Secondary | ICD-10-CM | POA: Diagnosis not present

## 2024-01-18 DIAGNOSIS — G4089 Other seizures: Secondary | ICD-10-CM | POA: Diagnosis not present

## 2024-01-18 DIAGNOSIS — R197 Diarrhea, unspecified: Secondary | ICD-10-CM | POA: Diagnosis not present

## 2024-01-18 LAB — RENAL FUNCTION PANEL
Albumin: 2.4 g/dL — ABNORMAL LOW (ref 3.5–5.0)
Albumin: 2.4 g/dL — ABNORMAL LOW (ref 3.5–5.0)
Anion gap: 15 (ref 5–15)
Anion gap: 13 (ref 5–15)
BUN: 76 mg/dL — ABNORMAL HIGH (ref 6–20)
BUN: 74 mg/dL — ABNORMAL HIGH (ref 6–20)
CO2: 20 mmol/L — ABNORMAL LOW (ref 22–32)
CO2: 24 mmol/L (ref 22–32)
Calcium: 8.8 mg/dL — ABNORMAL LOW (ref 8.9–10.3)
Calcium: 8.7 mg/dL — ABNORMAL LOW (ref 8.9–10.3)
Chloride: 101 mmol/L (ref 98–111)
Chloride: 99 mmol/L (ref 98–111)
Creatinine, Ser: 6.1 mg/dL — ABNORMAL HIGH (ref 0.61–1.24)
Creatinine, Ser: 5.12 mg/dL — ABNORMAL HIGH (ref 0.61–1.24)
GFR, Estimated: 10 mL/min — ABNORMAL LOW (ref >60)
GFR, Estimated: 12 mL/min — ABNORMAL LOW (ref >60)
Glucose, Bld: 186 mg/dL — ABNORMAL HIGH (ref 70–99)
Glucose, Bld: 219 mg/dL — ABNORMAL HIGH (ref 70–99)
Phosphorus: 5.8 mg/dL — ABNORMAL HIGH (ref 2.5–4.6)
Phosphorus: 5.3 mg/dL — ABNORMAL HIGH (ref 2.5–4.6)
Potassium: 4 mmol/L (ref 3.5–5.1)
Potassium: 3.2 mmol/L — ABNORMAL LOW (ref 3.5–5.1)
Sodium: 136 mmol/L (ref 135–145)
Sodium: 136 mmol/L (ref 135–145)

## 2024-01-18 LAB — CBC
HCT: 28.3 % — ABNORMAL LOW (ref 39.0–52.0)
Hemoglobin: 9.9 g/dL — ABNORMAL LOW (ref 13.0–17.0)
MCH: 29.9 pg (ref 26.0–34.0)
MCHC: 35 g/dL (ref 30.0–36.0)
MCV: 85.5 fL (ref 80.0–100.0)
Platelets: 506 K/uL — ABNORMAL HIGH (ref 150–400)
RBC: 3.31 MIL/uL — ABNORMAL LOW (ref 4.22–5.81)
RDW: 13.9 % (ref 11.5–15.5)
WBC: 6.6 K/uL (ref 4.0–10.5)
nRBC: 0 % (ref 0.0–0.2)

## 2024-01-18 LAB — GLUCOSE, CAPILLARY
Glucose-Capillary: 185 mg/dL — ABNORMAL HIGH (ref 70–99)
Glucose-Capillary: 207 mg/dL — ABNORMAL HIGH (ref 70–99)
Glucose-Capillary: 285 mg/dL — ABNORMAL HIGH (ref 70–99)
Glucose-Capillary: 229 mg/dL — ABNORMAL HIGH (ref 70–99)
Glucose-Capillary: 247 mg/dL — ABNORMAL HIGH (ref 70–99)

## 2024-01-18 LAB — MAGNESIUM: Magnesium: 2 mg/dL (ref 1.7–2.4)

## 2024-01-18 LAB — APTT: aPTT: 33 s (ref 24–36)

## 2024-01-18 MED ORDER — GLUCERNA SHAKE PO LIQD
237.0000 mL | Freq: Three times a day (TID) | ORAL | Status: DC
  Administered 2024-01-18 – 2024-01-20 (×7): 237 mL via ORAL

## 2024-01-18 NOTE — Progress Notes (Signed)
 PROGRESS NOTE                                                                                                                                                                                                             Patient Demographics:    Adam Beasley, is a 59 y.o. male, DOB - [birth year], FMW:[id number]  Outpatient Primary MD for the patient is [name], MD    LOS - 6  Admit date - 01/12/2024    Chief Complaint  Patient presents with   Cough       Brief Narrative (HPI from H&P)   58 yo male [facility] with c/o n/v/d x 4 days. Pt also reported sob and general fatigue at time of presentation. He denies documented fever/chills, no accompanying abdominal pain. Upon presentation pt was noted to actually have fever, was tachycardic and noted to have elevated liver enzymes with normal lactate as well as in acute renal failure with BUN/Cr ~50/>8. He was subsequently transferred to [facility], he developed renal failure requiring dialysis, nephrology was consulted he was diagnosed with Legionella pneumonia, transaminitis, he was admitted to ICU, seen by GI, nephrology underwent CRRT, he also had a seizure during this hospital stay.  Transferred to my care on 01/17/2024 on day 5 of hospital stay    Significant Hospital Events: Including procedures, antibiotic start and stop dates in addition to other pertinent events   7/27 - Presented to [facility], transferred to [facility] for admission, later transferred to ICU for initiation of crrt.  7/29 - CRRT discontinued 7/30 - legionella positive 01/17/2024.  EEG nonacute 01/17/2024 MRI brain limited study but nonacute except for scalp lipoma     Subjective:   Patient in bed, appears comfortable, denies any headache, no fever, no chest pain or pressure, no shortness of breath , no abdominal pain. No focal weakness.   Assessment  & Plan :    Acute hypoxic respiratory failure,  sepsis, Legionella pneumonia all present upon admission.  Seen by PCCM, treated with empiric antibiotics completed 10-day course of azithromycin , sepsis pathophysiology has resolved currently on room air and improving continue to monitor encouraged to sit in chair use I-S and flutter valve.   Rhabdomyolysis with AKI.  Nephrology on board, has left IJ dialysis catheter, underwent CRRT, defer management to nephrology service, electrophoresis results  per Dr. Melia suggest atypical pattern.  Continue to monitor renal function defer management to nephrology, will discontinue Foley catheter on 01/18/2024.  Seizure initially during his hospital stay.  Discussed with neurology, obtained MRI brain and EEG which were nonacute, MRI was limited but no acute findings except scalp lipoma, previously had negative head CT.  Currently seizure-free, likely due to metabolic derangements.  No headache or focal deficits, stable with outpatient neuro follow-up.  Will not drive for 6 months.  Asymptomatic transaminitis.  Likely due to combination of rhabdomyolysis, Legionella pneumonia and over-the-counter herbal supplements, appreciate GI input seen by [facility].  Trend and monitor.  Suspected OSA.  Outpatient sleep study and pulmonary follow-up postdischarge.  Hypertension.  Currently on Norvasc  and prazosin, add hydralazine  as needed.  Dyslipidemia.  Due to transaminitis statin on hold.  DM type II.  On Semglee  and sliding scale.  Will adjust further on 01/18/2024 for better control, monitor.  Lab Results  Component Value Date   HGBA1C 7.6 (H) 01/12/2024   CBG (last 3)  Recent Labs    01/17/24 1229 01/17/24 1716 01/17/24 2102  GLUCAP 339* 309* 222*        Condition - Extremely Guarded  Family Communication  : Wife bedside on 01/17/2024  Code Status : Code  Consults  : PCCM, nephrology, gastroenterology  PUD Prophylaxis :    Procedures  :            Disposition Plan  :    Status is:  Inpatient   DVT Prophylaxis  :    heparin  injection 5,000 Units Start: 01/14/24 1400 SCDs Start: 01/12/24 1655    Lab Results  Component Value Date   PLT 436 (H) 01/17/2024    Diet :  Diet Order             Diet Carb Modified Fluid consistency: Thin; Room service appropriate? Yes  Diet effective now                    Inpatient Medications  Scheduled Meds:  amLODipine   10 mg Oral Daily   azithromycin   500 mg Oral Daily   Chlorhexidine  Gluconate Cloth  6 each Topical Daily   doxazosin   2 mg Oral Daily   feeding supplement (GLUCERNA SHAKE)  237 mL Oral TID BM   heparin   5,000 Units Subcutaneous Q8H   insulin  aspart  0-5 Units Subcutaneous QHS   insulin  aspart  0-9 Units Subcutaneous TID WC   insulin  aspart  2 Units Subcutaneous TID WC   insulin  glargine-yfgn  20 Units Subcutaneous Daily   isosorbide  mononitrate  30 mg Oral Daily   multivitamin  1 tablet Oral QHS   Continuous Infusions: PRN Meds:.guaiFENesin , heparin , hydrALAZINE , morphine  injection, mouth rinse     Objective:   Vitals:   01/17/24 2057 01/18/24 0002 01/18/24 0344 01/18/24 0424  BP: (!) 140/87 (!) 140/89 (!) 139/91   Pulse: 95 85 76   Resp: 15 17 (!) 22   Temp: 98 F (36.7 C) 98.2 F (36.8 C) 97.9 F (36.6 C)   TempSrc: Oral Oral Oral   SpO2: 94% 96% 94%   Weight:    106.7 kg  Height:        Wt Readings from Last 3 Encounters:  01/18/24 106.7 kg  12/05/23 111.8 kg  10/25/23 106.6 kg     Intake/Output Summary (Last 24 hours) at 01/18/2024 0801 Last data filed at 01/18/2024 0603 Gross per 24 hour  Intake --  Output 4525 ml  Net -4525 ml     Physical Exam  Awake Alert, No new F.N deficits, Normal affect, left IJ dialysis catheter in place [place].AT,PERRAL Supple Neck, No JVD,   Symmetrical Chest wall movement, Good air movement bilaterally, CTAB RRR,No Gallops,Rubs or new Murmurs,  +ve B.Sounds, Abd Soft, No tenderness,   No Cyanosis, Clubbing or edema       Data Review:     Recent Labs  Lab 01/12/24 1119 01/12/24 1200 01/13/24 0512 01/14/24 0914 01/15/24 0426 01/16/24 0426 01/17/24 0630  WBC 7.5  --  6.3 7.2 6.3 6.2 7.2  HGB 11.7*   < > 10.7* 11.3* 10.5* 9.5* 10.2*  HCT 32.5*   < > 29.4* 31.7* 29.4* 27.6* 29.4*  PLT 177  --  179 201 238 305 436*  MCV 81.7  --  82.1 83.4 83.8 85.7 84.7  MCH 29.4  --  29.9 29.7 29.9 29.5 29.4  MCHC 36.0  --  36.4* 35.6 35.7 34.4 34.7  RDW 13.4  --  13.7 13.9 13.8 13.9 13.7  LYMPHSABS 0.8  --   --   --   --   --   --   MONOABS 0.7  --   --   --   --   --   --   EOSABS 0.0  --   --   --   --   --   --   BASOSABS 0.0  --   --   --   --   --   --    < > = values in this interval not displayed.    Recent Labs  Lab 01/12/24 0951 01/12/24 0959 01/12/24 1119 01/12/24 1200 01/12/24 1440 01/12/24 1441 01/12/24 1445 01/12/24 2049 01/13/24 0512 01/13/24 0514 01/14/24 0752 01/14/24 0753 01/14/24 1614 01/15/24 0426 01/15/24 1714 01/16/24 0426 01/16/24 1641 01/17/24 0630 01/17/24 1545  NA 127*  --   --    < >  --    < >  --   --  133*   < >  --   --    < > 131*  131* 130* 130* 131* 133* 134*  K 3.6  --   --    < >  --    < >  --   --  3.4*   < >  --   --    < > 3.4*  3.4* 3.4* 3.5 3.5 3.4* 3.3*  CL 91*  --   --    < >  --   --   --   --  100   < >  --   --    < > 99  99 96* 96* 97* 97* 98  CO2 17*  --   --    < >  --   --   --   --  17*   < >  --   --    < > 22  22 22 23 22  21* 22  ANIONGAP 18*  --   --    < >  --   --   --   --  16*   < >  --   --    < > 10  10 12 11 12 15 14   GLUCOSE 328*  --   --    < >  --   --   --   --  264*   < >  --   --    < >  215*  214* 286* 281* 340* 237* 273*  BUN 51*  --   --    < >  --   --   --   --  64*   < >  --   --    < > 38*  37* 50* 57* 65* 73* 73*  CREATININE 8.69*  --   --    < >  --   --   --   --  10.14*   < >  --   --    < > 5.83*  5.74* 7.02* 7.87* 7.92* 7.77* 7.07*  AST 834*  --  830*  --   --   --   --   --  437*  --  269*  --   --  195*  --   --   --   --    --   ALT 263*  --  264*  --   --   --   --   --  167*  --  136*  --   --  113*  --   --   --   --   --   ALKPHOS 78  --  77  --   --   --   --   --  66  --  61  --   --  61  --   --   --   --   --   BILITOT 2.2*  --  2.1*  --   --   --   --   --  1.4*  --  1.6*  --   --  1.4*  --   --   --   --   --   ALBUMIN 3.6  --  3.6  --   --   --   --   --  2.3*   < > 2.4*  --    < > 2.3*  2.3* 2.2* 2.1* 2.2* 2.4* 2.4*  CRP  --   --   --   --  31.7*  --   --   --   --   --   --   --   --   --   --   --   --   --   --   PROCALCITON  --   --   --   --   --   --   --  17.42  --   --   --   --   --   --   --   --   --   --   --   LATICACIDVEN  --  1.6  --   --   --   --  1.6  --   --   --   --   --   --   --   --   --   --   --   --   INR  --   --  1.1  --   --   --   --   --  1.2  --   --   --   --   --   --   --   --   --   --   HGBA1C  --   --   --   --   --   --   --  7.6*  --   --   --   --   --   --   --   --   --   --   --  AMMONIA  --   --   --   --   --   --   --   --  46*  --   --   --   --   --   --   --   --   --   --   MG  --   --   --    < >  --   --   --   --  2.4  --   --  2.4  --  2.4  --  2.5*  --  2.2  --   PHOS  --   --   --   --   --   --   --   --   --    < >  --   --    < > 1.9* 1.9* 3.5 3.4 4.3 4.6  CALCIUM 8.6*  --   --    < >  --   --   --   --  7.1*   < >  --   --    < > 7.8*  7.9* 7.7* 8.0* 8.2* 8.4* 8.4*   < > = values in this interval not displayed.      Recent Labs  Lab 01/12/24 0959 01/12/24 1119 01/12/24 1341 01/12/24 1440 01/12/24 1445 01/12/24 2049 01/13/24 0512 01/13/24 0514 01/14/24 0753 01/14/24 1614 01/15/24 0426 01/15/24 1714 01/16/24 0426 01/16/24 1641 01/17/24 0630 01/17/24 1545  CRP  --   --   --  31.7*  --   --   --   --   --   --   --   --   --   --   --   --   PROCALCITON  --   --   --   --   --  17.42  --   --   --   --   --   --   --   --   --   --   LATICACIDVEN 1.6  --   --   --  1.6  --   --   --   --   --   --   --   --   --   --   --    INR  --  1.1  --   --   --   --  1.2  --   --   --   --   --   --   --   --   --   HGBA1C  --   --   --   --   --  7.6*  --   --   --   --   --   --   --   --   --   --   AMMONIA  --   --   --   --   --   --  46*  --   --   --   --   --   --   --   --   --   MG  --   --    < >  --   --   --  2.4  --  2.4  --  2.4  --  2.5*  --  2.2  --   CALCIUM  --   --    < >  --   --   --  7.1*   < >  --    < > 7.8*  7.9* 7.7* 8.0* 8.2* 8.4* 8.4*   < > = values in this interval not displayed.    --------------------------------------------------------------------------------------------------------------- No results found for: CHOL, HDL, LDLCALC, LDLDIRECT, TRIG, CHOLHDL  Lab Results  Component Value Date   HGBA1C 7.6 (H) 01/12/2024      Micro Results Recent Results (from the past 240 hours)  MRSA Next Gen by PCR, Nasal     Status: None   Collection Time: 01/12/24 12:14 AM   Specimen: Nasal Mucosa; Nasal Swab  Result Value Ref Range Status   MRSA by PCR Next Gen NOT DETECTED NOT DETECTED Final    Comment: (NOTE) The GeneXpert MRSA Assay (FDA approved for NASAL specimens only), is one component of a comprehensive MRSA colonization surveillance program. It is not intended to diagnose MRSA infection nor to guide or monitor treatment for MRSA infections. Test performance is not FDA approved in patients less than 59 years old. Performed at [facility], [address] [place], [place] [address]   Blood Culture (routine x 2)     Status: None   Collection Time: 01/12/24 10:01 AM   Specimen: BLOOD  Result Value Ref Range Status   Specimen Description   Final    BLOOD RIGHT ANTECUBITAL Performed at [facility], [address] [place], [place] [address]    Special Requests   Final    BOTTLES DRAWN AEROBIC AND ANAEROBIC Blood Culture adequate volume Performed at [facility], [address] [place], [place] [address]    Culture   Final    NO GROWTH  5 DAYS Performed at [facility], [address], [place], [place] [address]    Report Status 01/17/2024 FINAL  Final  Blood Culture (routine x 2)     Status: None   Collection Time: 01/12/24 10:12 AM   Specimen: BLOOD  Result Value Ref Range Status   Specimen Description   Final    BLOOD LEFT ANTECUBITAL Performed at [facility], [address] [place], [place] [address]    Special Requests   Final    BOTTLES DRAWN AEROBIC AND ANAEROBIC Blood Culture adequate volume Performed at [facility], [address] [place], [place] [address]    Culture   Final    NO GROWTH 5 DAYS Performed at [facility], [address] [place], [place] [address]    Report Status 01/17/2024 FINAL  Final  Resp panel by RT-PCR (RSV, Flu A&B, Covid) Anterior Nasal Swab     Status: None   Collection Time: 01/12/24 12:30 PM   Specimen: Anterior Nasal Swab  Result Value Ref Range Status   SARS Coronavirus 2 by RT PCR NEGATIVE NEGATIVE Final    Comment: (NOTE) SARS-CoV-2 target nucleic acids are NOT DETECTED.  The SARS-CoV-2 RNA is generally detectable in upper respiratory specimens during the acute phase of infection. The lowest concentration of SARS-CoV-2 viral copies this assay can detect is 138 copies/mL. A negative result does not preclude SARS-Cov-2 infection and should not be used as the sole basis for treatment or other patient management decisions. A negative result may occur with  improper specimen collection/handling, submission of specimen other than nasopharyngeal swab, presence of viral mutation(s) within the areas targeted by this assay, and inadequate number of viral copies(<138 copies/mL). A negative result must be combined with clinical observations, patient history,  and epidemiological information. The expected result is Negative.  Fact Sheet for Patients:  BloggerCourse.com  Fact Sheet for Healthcare Providers:   SeriousBroker.it  This test is no t yet approved or cleared by the [place]  FDA and  has been authorized for detection and/or diagnosis of SARS-CoV-2 by FDA under an Emergency Use Authorization (EUA). This EUA will remain  in effect (meaning this test can be used) for the duration of the COVID-19 declaration under Section 564(b)(1) of the Act, 21 U.S.C.section 360bbb-3(b)(1), unless the authorization is terminated  or revoked sooner.       Influenza A by PCR NEGATIVE NEGATIVE Final   Influenza B by PCR NEGATIVE NEGATIVE Final    Comment: (NOTE) The Xpert Xpress SARS-CoV-2/FLU/RSV plus assay is intended as an aid in the diagnosis of influenza from Nasopharyngeal swab specimens and should not be used as a sole basis for treatment. Nasal washings and aspirates are unacceptable for Xpert Xpress SARS-CoV-2/FLU/RSV testing.  Fact Sheet for Patients: BloggerCourse.com  Fact Sheet for Healthcare Providers: SeriousBroker.it  This test is not yet approved or cleared by the [place]  FDA and has been authorized for detection and/or diagnosis of SARS-CoV-2 by FDA under an Emergency Use Authorization (EUA). This EUA will remain in effect (meaning this test can be used) for the duration of the COVID-19 declaration under Section 564(b)(1) of the Act, 21 U.S.C. section 360bbb-3(b)(1), unless the authorization is terminated or revoked.     Resp Syncytial Virus by PCR NEGATIVE NEGATIVE Final    Comment: (NOTE) Fact Sheet for Patients: BloggerCourse.com  Fact Sheet for Healthcare Providers: SeriousBroker.it  This test is not yet approved or cleared by the [place]  FDA and has been authorized for detection and/or diagnosis of SARS-CoV-2 by FDA under an Emergency Use Authorization (EUA). This EUA will remain in effect (meaning this test can be used) for  the duration of the COVID-19 declaration under Section 564(b)(1) of the Act, 21 U.S.C. section 360bbb-3(b)(1), unless the authorization is terminated or revoked.  Performed at [facility], [address] [place], [place] [address]   Urine Culture (for pregnant, neutropenic or urologic patients or patients with an indwelling urinary catheter)     Status: None   Collection Time: 01/12/24  5:00 PM   Specimen: Urine, Clean Catch  Result Value Ref Range Status   Specimen Description URINE, CLEAN CATCH  Final   Special Requests NONE  Final   Culture   Final    NO GROWTH Performed at [facility], [address] [place], [place] [address]    Report Status 01/14/2024 FINAL  Final  Gastrointestinal Panel by PCR , Stool     Status: None   Collection Time: 01/13/24  8:50 AM   Specimen: Stool  Result Value Ref Range Status   Campylobacter species NOT DETECTED NOT DETECTED Final   Plesimonas shigelloides NOT DETECTED NOT DETECTED Final   Salmonella species NOT DETECTED NOT DETECTED Final   Yersinia enterocolitica NOT DETECTED NOT DETECTED Final   Vibrio species NOT DETECTED NOT DETECTED Final   Vibrio cholerae NOT DETECTED NOT DETECTED Final   Enteroaggregative E coli (EAEC) NOT DETECTED NOT DETECTED Final   Enteropathogenic E coli (EPEC) NOT DETECTED NOT DETECTED Final   Enterotoxigenic E coli (ETEC) NOT DETECTED NOT DETECTED Final   Shiga like toxin producing E coli (STEC) NOT DETECTED NOT DETECTED Final   Shigella/Enteroinvasive E coli (EIEC) NOT DETECTED NOT DETECTED Final   Cryptosporidium NOT DETECTED NOT  DETECTED Final   Cyclospora cayetanensis NOT DETECTED NOT DETECTED Final   Entamoeba histolytica NOT DETECTED NOT DETECTED Final   Giardia lamblia NOT DETECTED NOT DETECTED Final   Adenovirus F40/41 NOT DETECTED NOT DETECTED Final   Astrovirus NOT DETECTED NOT DETECTED Final   Norovirus GI/GII NOT DETECTED NOT DETECTED Final   Rotavirus A NOT DETECTED NOT  DETECTED Final   Sapovirus (I, II, IV, and V) NOT DETECTED NOT DETECTED Final    Comment: Performed at [facility], [address] [place], [place] [address]  Respiratory (~20 pathogens) panel by PCR     Status: None   Collection Time: 01/14/24  8:09 AM   Specimen: Nasopharyngeal Swab; Respiratory  Result Value Ref Range Status   Adenovirus NOT DETECTED NOT DETECTED Final   Coronavirus 229E NOT DETECTED NOT DETECTED Final    Comment: (NOTE) The Coronavirus on the Respiratory Panel, DOES NOT test for the novel  Coronavirus (2019 nCoV)    Coronavirus HKU1 NOT DETECTED NOT DETECTED Final   Coronavirus NL63 NOT DETECTED NOT DETECTED Final   Coronavirus OC43 NOT DETECTED NOT DETECTED Final   Metapneumovirus NOT DETECTED NOT DETECTED Final   Rhinovirus / Enterovirus NOT DETECTED NOT DETECTED Final   Influenza A NOT DETECTED NOT DETECTED Final   Influenza B NOT DETECTED NOT DETECTED Final   Parainfluenza Virus 1 NOT DETECTED NOT DETECTED Final   Parainfluenza Virus 2 NOT DETECTED NOT DETECTED Final   Parainfluenza Virus 3 NOT DETECTED NOT DETECTED Final   Parainfluenza Virus 4 NOT DETECTED NOT DETECTED Final   Respiratory Syncytial Virus NOT DETECTED NOT DETECTED Final   Bordetella pertussis NOT DETECTED NOT DETECTED Final   Bordetella Parapertussis NOT DETECTED NOT DETECTED Final   Chlamydophila pneumoniae NOT DETECTED NOT DETECTED Final   Mycoplasma pneumoniae NOT DETECTED NOT DETECTED Final    Comment: Performed at [facility], [address], [place], [place] [address]    Radiology Report MR BRAIN WO CONTRAST Result Date: 01/17/2024 CLINICAL DATA:  Provided history: Seizure disorder, clinical change. EXAM: MRI HEAD WITHOUT CONTRAST TECHNIQUE: Multiplanar, multiecho pulse sequences of the brain and surrounding structures were obtained without intravenous contrast. COMPARISON:  Head CT 01/13/2024. FINDINGS: Intermittently motion degraded examination. Most notably, the  axial FLAIR sequence is moderate-to-severely motion degraded, the SWI sequence is severely motion degraded, the coronal whole brain T2 sequence is severely motion degraded, the coronal T2 sequence through the hippocampi is mild-to-moderately motion degraded and the coronal FLAIR sequence through the hippocampi is moderately motion degraded. Within this limitation, findings are as follows. Brain: No age-advanced or lobar predominant cerebral atrophy. No cortical encephalomalacia is identified. No appreciable hippocampal size or signal asymmetry. There is no acute infarct. No evidence of an intracranial mass. Severe motion degradation of the SWI sequence precludes adequate evaluation for chronic intracranial blood products. No extra-axial fluid collection. No midline shift. Vascular: Maintained flow voids within the proximal large arterial vessels. Skull and upper cervical spine: No focal worrisome marrow lesion. Sinuses/Orbits: No mass or acute finding within the imaged orbits. No significant paranasal sinus disease. 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 IMPRESSION: 1. Significantly motion degraded examination. 2. Within this limitation, there is no evidence of an acute intracranial abnormality and no specific seizure etiology is identified. 3. 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 Electronically Signed   By: Rockey Childs D.O.   On: 01/17/2024 20:18   EEG adult Result Date: 01/17/2024 Gregg Lek, MD  01/17/2024 11:18 AM Patient Name: Adam Beasley MRN: [id number] Epilepsy Attending: Pastor Falling Referring Physician/Provider: No ref. provider found     Date: 01/17/2024 Duration: 23 minutes Patient history: 58 year old past medical history of diabetes mellitus type 2 and hypertension presenting at bedside or [place] acutely ill with nausea vomiting diarrhea x 4 days along with shortness of breath and fatigue. Level of alertness: Awake, drowsy AEDs during EEG study: Technical aspects: This EEG  study was done with scalp electrodes positioned according to the 10-20 International system of electrode placement. Electrical activity was reviewed with band pass filter of 1-70Hz , sensitivity of 7 uV/mm, display speed of 30mm/sec with a 60Hz  notched filter applied as appropriate. EEG data were recorded continuously and digitally stored.  Video monitoring was available and reviewed as appropriate. Description: The posterior dominant rhythm consists of 9 Hz activity of moderate voltage (25-35 uV) seen predominantly in posterior head regions, symmetric and reactive to eye opening and eye closing. Drowsiness was characterized by attenuation of the posterior background rhythm. Sleep was not seen. Hyperventilation and photic stimulation were not performed.   ABNORMALITY -None IMPRESSION: This study is within normal limits. No seizures or epileptiform discharges were seen throughout the recording. A normal interictal EEG does not exclude nor support the diagnosis of epilepsy. Pastor Falling MD Neurology    DG Chest Port 1 View Result Date: 01/17/2024 CLINICAL DATA:  58 year old male with cough and shortness of breath. EXAM: PORTABLE CHEST 1 VIEW COMPARISON:  Chest CT 01/12/2024, portable chest 01/13/2024, and earlier. FINDINGS: Portable AP semi upright view at 0728 hours. Left lung pneumonia with airspace consolidation on the recent CT. Ventilation does not appear significantly improved since that time. Ongoing confluent peripheral left lung opacity. No definite pleural effusion. Stable cardiac size and mediastinal contours. Stable left IJ vascular catheter. Right lung appears stable and negative. Paucity of bowel gas. No acute osseous abnormality identified. IMPRESSION: Unresolved Left Lung Pneumonia since CT on 01/12/2024. No new cardiopulmonary abnormality. Electronically Signed   By: VEAR Hurst M.D.   On: 01/17/2024 07:51     Signature  -   Lavada Stank M.D on 01/18/2024 at 8:01 AM   -  To page go to [email or web address]

## 2024-01-18 NOTE — Plan of Care (Signed)

## 2024-01-18 NOTE — Plan of Care (Signed)
  Problem: Education: Goal: Knowledge of General Education information will improve Description: Including pain rating scale, medication(s)/side effects and non-pharmacologic comfort measures Outcome: Progressing   Problem: Health Behavior/Discharge Planning: Goal: Ability to manage health-related needs will improve Outcome: Progressing   Problem: Clinical Measurements: Goal: Ability to maintain clinical measurements within normal limits will improve Outcome: Progressing Goal: Will remain free from infection Outcome: Progressing Goal: Diagnostic test results will improve Outcome: Progressing Goal: Respiratory complications will improve Outcome: Progressing Goal: Cardiovascular complication will be avoided Outcome: Progressing   Problem: Activity: Goal: Risk for activity intolerance will decrease Outcome: Progressing   Problem: Nutrition: Goal: Adequate nutrition will be maintained Outcome: Progressing   Problem: Coping: Goal: Level of anxiety will decrease Outcome: Progressing   Problem: Elimination: Goal: Will not experience complications related to bowel motility Outcome: Progressing Goal: Will not experience complications related to urinary retention Outcome: Progressing   Problem: Pain Managment: Goal: General experience of comfort will improve and/or be controlled Outcome: Progressing   Problem: Safety: Goal: Ability to remain free from injury will improve Outcome: Progressing   Problem: Skin Integrity: Goal: Risk for impaired skin integrity will decrease Outcome: Progressing   Problem: Education: Goal: Ability to describe self-care measures that may prevent or decrease complications (Diabetes Survival Skills Education) will improve Outcome: Progressing Goal: Individualized Educational Video(s) Outcome: Progressing   Problem: Coping: Goal: Ability to adjust to condition or change in health will improve Outcome: Progressing   Problem: Fluid  Volume: Goal: Ability to maintain a balanced intake and output will improve Outcome: Progressing   Problem: Health Behavior/Discharge Planning: Goal: Ability to identify and utilize available resources and services will improve Outcome: Progressing Goal: Ability to manage health-related needs will improve Outcome: Progressing   Problem: Metabolic: Goal: Ability to maintain appropriate glucose levels will improve Outcome: Progressing   Problem: Nutritional: Goal: Maintenance of adequate nutrition will improve Outcome: Progressing   Problem: Skin Integrity: Goal: Risk for impaired skin integrity will decrease Outcome: Progressing   Problem: Tissue Perfusion: Goal: Adequacy of tissue perfusion will improve Outcome: Progressing   Problem: Fluid Volume: Goal: Hemodynamic stability will improve Outcome: Progressing   Problem: Clinical Measurements: Goal: Diagnostic test results will improve Outcome: Progressing Goal: Signs and symptoms of infection will decrease Outcome: Progressing   Problem: Respiratory: Goal: Ability to maintain adequate ventilation will improve Outcome: Progressing   Problem: Activity: Goal: Ability to tolerate increased activity will improve Outcome: Progressing   Problem: Clinical Measurements: Goal: Ability to maintain a body temperature in the normal range will improve Outcome: Progressing   Problem: Respiratory: Goal: Ability to maintain adequate ventilation will improve Outcome: Progressing Goal: Ability to maintain a clear airway will improve Outcome: Progressing

## 2024-01-18 NOTE — Progress Notes (Signed)
 Order placed by MD this morning to remove foley catheter.  Patient refusing to have catheter removed at this time.

## 2024-01-18 NOTE — Progress Notes (Signed)
 Adam Beasley is an 58 y.o. male w/ PMH as below who presented early 7/27 morning c/o cough, diarrhea and N/V for the last 4 days. Also SOB and fatigue. In the ED temp 103, RR 20, HR 121, BP 160/90. [place] 2 L at 93%.  Labs showed Na 130, K 3.6, CO2 14, bun 51, creat 8.81 (0.9 in may 2025) with hosp course complicated by seizures, CT showed new consolidation in LUL and LLL.  Assessment/Plan: AKI, severe: w/ normal b/l creat 0.9- 1.0 from jan and may 2025, eGFR 96 ml/min. Creat here is 8.6 in the setting of an acute resp illness x 1 week, w/ multifocal PNA most likely per CT. No acei/ ARB at home, BP's stable here. No nephrotoxins. On exam looks euvolemic, possibly a bit dry. CT showed bilat kidneys w/o obstruction. UA showed ^protein but no rbcs/ wbcs. Patient just had a significant seizure episode, this could possibly due to uremia. Not sure cause of AKI, vol depletion probably played a role initially but likely ATN from rhabdo (CPK 10k). Due to the seizures, which could be uremia-related, pt started on CRRT 7/27-29.  - Renal function may possibly be stabilizing; urine output very good  c/w the polyuric phase of ATN entering the recovery phase.    D/W spouse and patient; DC Foley.  Patient thinks he can void on his own.  No history of obstruction in the past.    Should be able to discontinue the temporary catheter in the next 24 to 48 hours  -Monitor Daily I/Os, Daily weight  -Maintain MAP>65 for optimal renal perfusion.  - Avoid nephrotoxic agents such as IV contrast, NSAIDs, and phosphate containing bowel preps (FLEETS)  Multifocal PNA: per CT, on IV abx per pmd. +Legionella. Acute seizures: tonight, RR rx'd w/ IV ativan .   Subjective: Spouse bedside; appetite better, urine in the foley much lighter    Chemistry and CBC: Creat  Date/Time Value Ref Range Status  07/16/2023 03:15 PM 0.93 0.70 - 1.30 mg/dL Final  87/97/7975 96:47 PM 0.95 0.70 - 1.30 mg/dL Final   Creatinine, Ser  Date/Time  Value Ref Range Status  01/18/2024 05:56 AM 6.10 (H) 0.61 - 1.24 mg/dL Final  91/98/7974 96:54 PM 7.07 (H) 0.61 - 1.24 mg/dL Final  91/98/7974 93:69 AM 7.77 (H) 0.61 - 1.24 mg/dL Final  92/68/7974 95:58 PM 7.92 (H) 0.61 - 1.24 mg/dL Final  92/68/7974 95:73 AM 7.87 (H) 0.61 - 1.24 mg/dL Final  92/69/7974 94:85 PM 7.02 (H) 0.61 - 1.24 mg/dL Final  92/69/7974 95:73 AM 5.74 (H) 0.61 - 1.24 mg/dL Final  92/69/7974 95:73 AM 5.83 (H) 0.61 - 1.24 mg/dL Final  92/70/7974 95:85 PM 4.99 (H) 0.61 - 1.24 mg/dL Final  92/70/7974 94:99 AM 5.46 (H) 0.61 - 1.24 mg/dL Final  92/71/7974 95:91 PM 6.98 (H) 0.61 - 1.24 mg/dL Final  92/71/7974 94:85 AM 10.17 (H) 0.61 - 1.24 mg/dL Final  92/71/7974 94:87 AM 10.14 (H) 0.61 - 1.24 mg/dL Final  92/72/7974 98:58 PM 8.81 (H) 0.61 - 1.24 mg/dL Final  92/72/7974 90:48 AM 8.69 (H) 0.61 - 1.24 mg/dL Final  94/90/7974 97:80 PM 1.06 0.61 - 1.24 mg/dL Final  98/72/7983 88:59 PM 1.04 0.50 - 1.35 mg/dL Final   Recent Labs  Lab 01/15/24 0426 01/15/24 1714 01/16/24 0426 01/16/24 1641 01/17/24 0630 01/17/24 1545 01/18/24 0556  NA 131*  131* 130* 130* 131* 133* 134* 136  K 3.4*  3.4* 3.4* 3.5 3.5 3.4* 3.3* 4.0  CL 99  99 96* 96* 97*  97* 98 101  CO2 22  22 22 23 22  21* 22 20*  GLUCOSE 215*  214* 286* 281* 340* 237* 273* 186*  BUN 38*  37* 50* 57* 65* 73* 73* 76*  CREATININE 5.83*  5.74* 7.02* 7.87* 7.92* 7.77* 7.07* 6.10*  CALCIUM 7.8*  7.9* 7.7* 8.0* 8.2* 8.4* 8.4* 8.8*  PHOS 1.9* 1.9* 3.5 3.4 4.3 4.6 5.8*   Recent Labs  Lab 01/12/24 1119 01/12/24 1200 01/15/24 0426 01/16/24 0426 01/17/24 0630 01/18/24 0556  WBC 7.5   < > 6.3 6.2 7.2 6.6  NEUTROABS 5.8  --   --   --   --   --   HGB 11.7*   < > 10.5* 9.5* 10.2* 9.9*  HCT 32.5*   < > 29.4* 27.6* 29.4* 28.3*  MCV 81.7   < > 83.8 85.7 84.7 85.5  PLT 177   < > 238 305 436* 506*   < > = values in this interval not displayed.   Liver Function Tests: Recent Labs  Lab 01/13/24 0512 01/13/24 0514  01/14/24 0752 01/14/24 1614 01/15/24 0426 01/15/24 1714 01/17/24 0630 01/17/24 1545 01/18/24 0556  AST 437*  --  269*  --  195*  --   --   --   --   ALT 167*  --  136*  --  113*  --   --   --   --   ALKPHOS 66  --  61  --  61  --   --   --   --   BILITOT 1.4*  --  1.6*  --  1.4*  --   --   --   --   PROT 5.9*  --  6.2*  --  5.8*  --   --   --   --   ALBUMIN 2.3*   < > 2.4*   < > 2.3*  2.3*   < > 2.4* 2.4* 2.4*   < > = values in this interval not displayed.   Recent Labs  Lab 01/12/24 2049  LIPASE 163*   Recent Labs  Lab 01/13/24 0512  AMMONIA 46*   Cardiac Enzymes: Recent Labs  Lab 01/12/24 2049 01/13/24 0512 01/14/24 0752 01/15/24 0426  CKTOTAL 13,277* 10,078* 3,761* 4,013*   Iron Studies: No results for input(s): IRON, TIBC, TRANSFERRIN, FERRITIN in the last 72 hours. PT/INR: @LABRCNTIP (inr:5)  Xrays/Other Studies: ) Results for orders placed or performed during the hospital encounter of 01/12/24 (from the past 48 hours)  Glucose, capillary     Status: Abnormal   Collection Time: 01/16/24 11:29 AM  Result Value Ref Range   Glucose-Capillary 325 (H) 70 - 99 mg/dL    Comment: Glucose reference range applies only to samples taken after fasting for at least 8 hours.  Glucose, capillary     Status: Abnormal   Collection Time: 01/16/24  3:40 PM  Result Value Ref Range   Glucose-Capillary 290 (H) 70 - 99 mg/dL    Comment: Glucose reference range applies only to samples taken after fasting for at least 8 hours.  Renal function panel (daily at 1600)     Status: Abnormal   Collection Time: 01/16/24  4:41 PM  Result Value Ref Range   Sodium 131 (L) 135 - 145 mmol/L   Potassium 3.5 3.5 - 5.1 mmol/L   Chloride 97 (L) 98 - 111 mmol/L   CO2 22 22 - 32 mmol/L   Glucose, Bld 340 (H) 70 - 99 mg/dL  Comment: Glucose reference range applies only to samples taken after fasting for at least 8 hours.   BUN 65 (H) 6 - 20 mg/dL   Creatinine, Ser 2.07 (H) 0.61 - 1.24  mg/dL   Calcium 8.2 (L) 8.9 - 10.3 mg/dL   Phosphorus 3.4 2.5 - 4.6 mg/dL   Albumin 2.2 (L) 3.5 - 5.0 g/dL   GFR, Estimated 7 (L) >60 mL/min    Comment: (NOTE) Calculated using the CKD-EPI Creatinine Equation (2021)    Anion gap 12 5 - 15    Comment: Performed at [facility], [address], [place], [place] [address]  Renal function panel (daily at 0500)     Status: Abnormal   Collection Time: 01/17/24  6:30 AM  Result Value Ref Range   Sodium 133 (L) 135 - 145 mmol/L   Potassium 3.4 (L) 3.5 - 5.1 mmol/L   Chloride 97 (L) 98 - 111 mmol/L   CO2 21 (L) 22 - 32 mmol/L   Glucose, Bld 237 (H) 70 - 99 mg/dL    Comment: Glucose reference range applies only to samples taken after fasting for at least 8 hours.   BUN 73 (H) 6 - 20 mg/dL   Creatinine, Ser 2.22 (H) 0.61 - 1.24 mg/dL   Calcium 8.4 (L) 8.9 - 10.3 mg/dL   Phosphorus 4.3 2.5 - 4.6 mg/dL   Albumin 2.4 (L) 3.5 - 5.0 g/dL   GFR, Estimated 7 (L) >60 mL/min    Comment: (NOTE) Calculated using the CKD-EPI Creatinine Equation (2021)    Anion gap 15 5 - 15    Comment: Performed at [facility], [address] [place], [place] [address]  Magnesium      Status: None   Collection Time: 01/17/24  6:30 AM  Result Value Ref Range   Magnesium  2.2 1.7 - 2.4 mg/dL    Comment: Performed at [facility], [address] [place], [place] [address]  APTT     Status: None   Collection Time: 01/17/24  6:30 AM  Result Value Ref Range   aPTT 33 24 - 36 seconds    Comment: Performed at [facility], [address] [place], [place] [address]  CBC     Status: Abnormal   Collection Time: 01/17/24  6:30 AM  Result Value Ref Range   WBC 7.2 4.0 - 10.5 K/uL   RBC 3.47 (L) 4.22 - 5.81 MIL/uL   Hemoglobin 10.2 (L) 13.0 - 17.0 g/dL   HCT 70.5 (L) 60.9 - 47.9 %   MCV 84.7 80.0 - 100.0 fL   MCH 29.4 26.0 - 34.0 pg   MCHC 34.7 30.0 - 36.0 g/dL   RDW 86.2 88.4 - 84.4 %   Platelets 436 (H) 150 - 400 K/uL   nRBC 0.0 0.0 - 0.2 %     Comment: Performed at [facility], [address] [place], [place] [address]  Glucose, capillary     Status: Abnormal   Collection Time: 01/17/24  8:35 AM  Result Value Ref Range   Glucose-Capillary 243 (H) 70 - 99 mg/dL    Comment: Glucose reference range applies only to samples taken after fasting for at least 8 hours.  Glucose, capillary     Status: Abnormal   Collection Time: 01/17/24 12:29 PM  Result Value Ref Range   Glucose-Capillary 339 (H) 70 - 99 mg/dL    Comment: Glucose reference range applies only to samples taken after fasting for  at least 8 hours.  Renal function panel (daily at 1600)     Status: Abnormal   Collection Time: 01/17/24  3:45 PM  Result Value Ref Range   Sodium 134 (L) 135 - 145 mmol/L   Potassium 3.3 (L) 3.5 - 5.1 mmol/L   Chloride 98 98 - 111 mmol/L   CO2 22 22 - 32 mmol/L   Glucose, Bld 273 (H) 70 - 99 mg/dL    Comment: Glucose reference range applies only to samples taken after fasting for at least 8 hours.   BUN 73 (H) 6 - 20 mg/dL   Creatinine, Ser 2.92 (H) 0.61 - 1.24 mg/dL   Calcium 8.4 (L) 8.9 - 10.3 mg/dL   Phosphorus 4.6 2.5 - 4.6 mg/dL   Albumin 2.4 (L) 3.5 - 5.0 g/dL   GFR, Estimated 8 (L) >60 mL/min    Comment: (NOTE) Calculated using the CKD-EPI Creatinine Equation (2021)    Anion gap 14 5 - 15    Comment: Performed at [facility], [address], [place], [place] [address]  Glucose, capillary     Status: Abnormal   Collection Time: 01/17/24  5:16 PM  Result Value Ref Range   Glucose-Capillary 309 (H) 70 - 99 mg/dL    Comment: Glucose reference range applies only to samples taken after fasting for at least 8 hours.  Glucose, capillary     Status: Abnormal   Collection Time: 01/17/24  9:02 PM  Result Value Ref Range   Glucose-Capillary 222 (H) 70 - 99 mg/dL    Comment: Glucose reference range applies only to samples taken after fasting for at least 8 hours.   Comment 1 Notify RN    Comment 2 Document in Chart    Renal function panel (daily at 0500)     Status: Abnormal   Collection Time: 01/18/24  5:56 AM  Result Value Ref Range   Sodium 136 135 - 145 mmol/L   Potassium 4.0 3.5 - 5.1 mmol/L   Chloride 101 98 - 111 mmol/L   CO2 20 (L) 22 - 32 mmol/L   Glucose, Bld 186 (H) 70 - 99 mg/dL    Comment: Glucose reference range applies only to samples taken after fasting for at least 8 hours.   BUN 76 (H) 6 - 20 mg/dL   Creatinine, Ser 3.89 (H) 0.61 - 1.24 mg/dL   Calcium 8.8 (L) 8.9 - 10.3 mg/dL   Phosphorus 5.8 (H) 2.5 - 4.6 mg/dL   Albumin 2.4 (L) 3.5 - 5.0 g/dL   GFR, Estimated 10 (L) >60 mL/min    Comment: (NOTE) Calculated using the CKD-EPI Creatinine Equation (2021)    Anion gap 15 5 - 15    Comment: Performed at [facility], [address] [place], [place] [address]  APTT     Status: None   Collection Time: 01/18/24  5:56 AM  Result Value Ref Range   aPTT 33 24 - 36 seconds    Comment: Performed at [facility], [address], [place], [place] [address]  CBC     Status: Abnormal   Collection Time: 01/18/24  5:56 AM  Result Value Ref Range   WBC 6.6 4.0 - 10.5 K/uL   RBC 3.31 (L) 4.22 - 5.81 MIL/uL   Hemoglobin 9.9 (L) 13.0 - 17.0 g/dL   HCT 71.6 (L) 60.9 - 47.9 %   MCV 85.5 80.0 - 100.0 fL   MCH 29.9 26.0 - 34.0 pg  MCHC 35.0 30.0 - 36.0 g/dL   RDW 86.0 88.4 - 84.4 %   Platelets 506 (H) 150 - 400 K/uL   nRBC 0.0 0.0 - 0.2 %    Comment: Performed at [facility], [address] [place], [place] [address]  Glucose, capillary     Status: Abnormal   Collection Time: 01/18/24  8:43 AM  Result Value Ref Range   Glucose-Capillary 207 (H) 70 - 99 mg/dL    Comment: Glucose reference range applies only to samples taken after fasting for at least 8 hours.   MR BRAIN WO CONTRAST Result Date: 01/17/2024 CLINICAL DATA:  Provided history: Seizure disorder, clinical change. EXAM: MRI HEAD WITHOUT CONTRAST TECHNIQUE: Multiplanar, multiecho pulse sequences of the brain and  surrounding structures were obtained without intravenous contrast. COMPARISON:  Head CT 01/13/2024. FINDINGS: Intermittently motion degraded examination. Most notably, the axial FLAIR sequence is moderate-to-severely motion degraded, the SWI sequence is severely motion degraded, the coronal whole brain T2 sequence is severely motion degraded, the coronal T2 sequence through the hippocampi is mild-to-moderately motion degraded and the coronal FLAIR sequence through the hippocampi is moderately motion degraded. Within this limitation, findings are as follows. Brain: No age-advanced or lobar predominant cerebral atrophy. No cortical encephalomalacia is identified. No appreciable hippocampal size or signal asymmetry. There is no acute infarct. No evidence of an intracranial mass. Severe motion degradation of the SWI sequence precludes adequate evaluation for chronic intracranial blood products. No extra-axial fluid collection. No midline shift. Vascular: Maintained flow voids within the proximal large arterial vessels. Skull and upper cervical spine: No focal worrisome marrow lesion. Sinuses/Orbits: No mass or acute finding within the imaged orbits. No significant paranasal sinus disease. 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 IMPRESSION: 1. Significantly motion degraded examination. 2. Within this limitation, there is no evidence of an acute intracranial abnormality and no specific seizure etiology is identified. 3. 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 Electronically Signed   By: Rockey Childs D.O.   On: 01/17/2024 20:18   EEG adult Result Date: 01/17/2024 Gregg Lek, MD     01/17/2024 11:18 AM Patient Name: Adam Beasley MRN: [id number] Epilepsy Attending: Lek Gregg Referring Physician/Provider: No ref. provider found     Date: 01/17/2024 Duration: 23 minutes Patient history: 58 year old past medical history of diabetes mellitus type 2 and hypertension presenting at bedside or [place] acutely  ill with nausea vomiting diarrhea x 4 days along with shortness of breath and fatigue. Level of alertness: Awake, drowsy AEDs during EEG study: Technical aspects: This EEG study was done with scalp electrodes positioned according to the 10-20 International system of electrode placement. Electrical activity was reviewed with band pass filter of 1-70Hz , sensitivity of 7 uV/mm, display speed of 73mm/sec with a 60Hz  notched filter applied as appropriate. EEG data were recorded continuously and digitally stored.  Video monitoring was available and reviewed as appropriate. Description: The posterior dominant rhythm consists of 9 Hz activity of moderate voltage (25-35 uV) seen predominantly in posterior head regions, symmetric and reactive to eye opening and eye closing. Drowsiness was characterized by attenuation of the posterior background rhythm. Sleep was not seen. Hyperventilation and photic stimulation were not performed.   ABNORMALITY -None IMPRESSION: This study is within normal limits. No seizures or epileptiform discharges were seen throughout the recording. A normal interictal EEG does not exclude nor support the diagnosis of epilepsy. Lek Gregg MD Neurology    DG Chest [facility] 1 View Result Date:  01/17/2024 CLINICAL DATA:  58 year old male with cough and shortness of breath. EXAM: PORTABLE CHEST 1 VIEW COMPARISON:  Chest CT 01/12/2024, portable chest 01/13/2024, and earlier. FINDINGS: Portable AP semi upright view at 0728 hours. Left lung pneumonia with airspace consolidation on the recent CT. Ventilation does not appear significantly improved since that time. Ongoing confluent peripheral left lung opacity. No definite pleural effusion. Stable cardiac size and mediastinal contours. Stable left IJ vascular catheter. Right lung appears stable and negative. Paucity of bowel gas. No acute osseous abnormality identified. IMPRESSION: Unresolved Left Lung Pneumonia since CT on 01/12/2024. No new cardiopulmonary  abnormality. Electronically Signed   By: VEAR Hurst M.D.   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 No pertinent surgical history.  Allergies: No Known Allergies  Medications:   Prior to Admission medications   Medication Sig Start Date End Date Taking? Authorizing Provider  amLODipine  (NORVASC ) 5 MG tablet Take 1 tablet (5 mg total) by mouth daily. 08/29/23  Yes Thapa, [place], MD  atorvastatin (LIPITOR) 80 MG tablet Take 80 mg by mouth daily.   Yes [provider]  doxazosin  (CARDURA ) 2 MG tablet Take 2 mg by mouth daily.   Yes [provider]  eplerenone  (INSPRA ) 50 MG tablet Take 1 tablet (50 mg total) by mouth daily. 08/28/23  Yes Thapa, [place], MD  metFORMIN (GLUCOPHAGE) 500 MG tablet Take 500 mg by mouth daily with breakfast.   Yes [provider]  azithromycin  (ZITHROMAX ) 250 MG tablet Take 1 tablet (250 mg total) by mouth daily. 07/15/14   Terryl Kubas, NP  chlorpheniramine-HYDROcodone [facility] PENNKINETIC ER) 10-8 MG/5ML LQCR Take 5 mLs by mouth 2 (two) times daily. 07/15/14   Terryl Kubas, NP  doxazosin  (CARDURA ) 1 MG tablet Take 2 tablets (2 mg total) by mouth daily. Patient not taking: Reported on 01/12/2024 08/28/23   Thapa, [place], MD    Discontinued Meds:   Medications Discontinued During This Encounter  Medication Reason   acetaminophen  (TYLENOL ) tablet 650 mg    atorvastatin (LIPITOR) 40 MG tablet Discontinued by provider   ezetimibe (ZETIA) 10 MG tablet Discontinued by provider   doxazosin  (CARDURA ) 2 MG tablet Discontinued by provider   spironolactone  (ALDACTONE ) 100 MG tablet Discontinued by provider   verapamil  (CALAN -SR) 180 MG CR tablet Discontinued by provider   metFORMIN (GLUCOPHAGE) 500 MG tablet Discontinued by provider   lactated ringers  infusion     metroNIDAZOLE  (FLAGYL ) IVPB 500 mg    ceFEPIme (MAXIPIME) 2 g in sodium chloride  0.9 % 100 mL IVPB    famotidine  (PEPCID ) IVPB 20 mg premix    azithromycin  (ZITHROMAX ) 500 mg in sodium chloride  0.9 % 250 mL IVPB    metroNIDAZOLE  (FLAGYL ) IVPB 500 mg    LORazepam  (ATIVAN ) injection 2 mg    famotidine  (PEPCID ) tablet 20 mg    famotidine  (PEPCID ) tablet 20 mg    0.9 %  sodium chloride  infusion    metroNIDAZOLE  (FLAGYL ) tablet 500 mg    heparin  injection 5,000 Units    sodium bicarbonate  150 mEq in sterile water  1,150 mL infusion    hydrALAZINE  (APRESOLINE ) injection 10 mg    amLODipine  (NORVASC ) tablet 5 mg    heparin  10,000 units/ 20 mL infusion syringe    insulin  aspart (novoLOG ) injection 0-6 Units  prismasol  BGK 4/2.5 infusion    prismasol  BGK 4/2.5 infusion    prismasol  BGK 4/2.5 infusion    insulin  aspart (novoLOG ) injection 0-15 Units    insulin  glargine-yfgn (SEMGLEE ) injection 10 Units    hydrALAZINE  (APRESOLINE ) injection 20 mg    feeding supplement (ENSURE PLUS HIGH PROTEIN) liquid 237 mL     Social History:  reports that he has never smoked. He has never used smokeless tobacco. He reports that he does not drink alcohol. No history on file for drug use.  Family History:  History reviewed. No pertinent family history.  Blood pressure (!) 147/92, pulse 82, temperature 98.3 F (36.8 C), temperature source Oral, resp. rate 18, height 6' (1.829 m), weight 106.7 kg, SpO2 93%. Physical Exam: Gen NAD, awake and conversant, [place]  O2 No rash, cyanosis or gangrene Sclera anicteric, throat clear  No jvd or bruits Chest clear bilat to bases RRR no MRG Abd soft ntnd no mass or ascites +bs GU foley cath in place Ext no LE or UE edema, no other edema Access LIJ temp     MELIA LYNWOOD ORN, MD 01/18/2024, 8:52 AM

## 2024-01-19 DIAGNOSIS — N179 Acute kidney failure, unspecified: Secondary | ICD-10-CM | POA: Diagnosis not present

## 2024-01-19 DIAGNOSIS — R197 Diarrhea, unspecified: Secondary | ICD-10-CM | POA: Diagnosis not present

## 2024-01-19 DIAGNOSIS — R112 Nausea with vomiting, unspecified: Secondary | ICD-10-CM | POA: Diagnosis not present

## 2024-01-19 DIAGNOSIS — G4089 Other seizures: Secondary | ICD-10-CM | POA: Diagnosis not present

## 2024-01-19 DIAGNOSIS — J189 Pneumonia, unspecified organism: Secondary | ICD-10-CM | POA: Diagnosis not present

## 2024-01-19 LAB — GLUCOSE, CAPILLARY
Glucose-Capillary: 176 mg/dL — ABNORMAL HIGH (ref 70–99)
Glucose-Capillary: 314 mg/dL — ABNORMAL HIGH (ref 70–99)
Glucose-Capillary: 129 mg/dL — ABNORMAL HIGH (ref 70–99)
Glucose-Capillary: 246 mg/dL — ABNORMAL HIGH (ref 70–99)

## 2024-01-19 LAB — APTT: aPTT: 31 s (ref 24–36)

## 2024-01-19 LAB — RENAL FUNCTION PANEL
Albumin: 2.5 g/dL — ABNORMAL LOW (ref 3.5–5.0)
Albumin: 2.5 g/dL — ABNORMAL LOW (ref 3.5–5.0)
Anion gap: 11 (ref 5–15)
Anion gap: 11 (ref 5–15)
BUN: 70 mg/dL — ABNORMAL HIGH (ref 6–20)
BUN: 68 mg/dL — ABNORMAL HIGH (ref 6–20)
CO2: 23 mmol/L (ref 22–32)
CO2: 21 mmol/L — ABNORMAL LOW (ref 22–32)
Calcium: 8.7 mg/dL — ABNORMAL LOW (ref 8.9–10.3)
Calcium: 8.6 mg/dL — ABNORMAL LOW (ref 8.9–10.3)
Chloride: 103 mmol/L (ref 98–111)
Chloride: 102 mmol/L (ref 98–111)
Creatinine, Ser: 3.82 mg/dL — ABNORMAL HIGH (ref 0.61–1.24)
Creatinine, Ser: 3.45 mg/dL — ABNORMAL HIGH (ref 0.61–1.24)
GFR, Estimated: 17 mL/min — ABNORMAL LOW (ref >60)
GFR, Estimated: 20 mL/min — ABNORMAL LOW (ref >60)
Glucose, Bld: 191 mg/dL — ABNORMAL HIGH (ref 70–99)
Glucose, Bld: 284 mg/dL — ABNORMAL HIGH (ref 70–99)
Phosphorus: 5.2 mg/dL — ABNORMAL HIGH (ref 2.5–4.6)
Phosphorus: 4.7 mg/dL — ABNORMAL HIGH (ref 2.5–4.6)
Potassium: 3.3 mmol/L — ABNORMAL LOW (ref 3.5–5.1)
Potassium: 3.5 mmol/L (ref 3.5–5.1)
Sodium: 137 mmol/L (ref 135–145)
Sodium: 134 mmol/L — ABNORMAL LOW (ref 135–145)

## 2024-01-19 LAB — MAGNESIUM: Magnesium: 1.7 mg/dL (ref 1.7–2.4)

## 2024-01-19 MED ORDER — POTASSIUM CHLORIDE CRYS ER 20 MEQ PO TBCR
30.0000 meq | EXTENDED_RELEASE_TABLET | Freq: Once | ORAL | Status: AC
  Administered 2024-01-19: 30 meq via ORAL
  Filled 2024-01-19: qty 1

## 2024-01-19 NOTE — Progress Notes (Signed)
 Adam Beasley is an 58 y.o. male w/ PMH as below who presented early 7/27 morning c/o cough, diarrhea and N/V for the last 4 days. Also SOB and fatigue. In the ED temp 103, RR 20, HR 121, BP 160/90. [facility] [place] [facility] 2 L at 93%.  Labs showed Na 130, K 3.6, CO2 14, bun 51, creat 8.81 (0.9 in may 2025) with hosp course complicated by seizures, CT showed new consolidation in LUL and LLL.  Assessment/Plan: AKI, severe: w/ normal b/l creat 0.9- 1.0 from jan and may 2025, eGFR 96 ml/min. Creat here is 8.6 in the setting of an acute resp illness x 1 week, w/ multifocal PNA most likely per CT. No acei/ ARB at home, BP's stable here. No nephrotoxins. On exam looks euvolemic, possibly a bit dry. CT showed bilat kidneys w/o obstruction. UA showed ^protein but no rbcs/ wbcs. Patient just had a significant seizure episode, this could possibly due to uremia. Not sure cause of AKI, vol depletion probably played a role initially but likely ATN from rhabdo (CPK 10k). Due to the seizures, which could be uremia-related, pt started on CRRT 7/27-29.  - Recovery phase of ATN.  - OK to DC Foley and LIJ temp. No f/u with [facility] needed; if the creatinine does not go back to baseline the patient's primary can refer to [facility] at that time.   No history of obstruction in the past.    Signing off at this time; please reconsult as needed.  -Monitor Daily I/Os, Daily weight  -Maintain MAP>65 for optimal renal perfusion.  - Avoid nephrotoxic agents such as IV contrast, NSAIDs, and phosphate containing bowel preps (FLEETS)  Multifocal PNA: per CT, on IV abx per pmd. +Legionella. Acute seizures: tonight, RR rx'd w/ IV ativan .   Subjective: Spouse bedside; appetite better, urine in the foley much lighter    Chemistry and CBC: Creat  Date/Time Value Ref Range Status  07/16/2023 03:15 PM 0.93 0.70 - 1.30 mg/dL Final  87/97/7975 96:47 PM 0.95 0.70 - 1.30 mg/dL Final   Creatinine, Ser  Date/Time Value Ref Range Status  01/19/2024 05:30 AM 3.82  (H) 0.61 - 1.24 mg/dL Final  91/97/7974 96:56 PM 5.12 (H) 0.61 - 1.24 mg/dL Final  91/97/7974 94:43 AM 6.10 (H) 0.61 - 1.24 mg/dL Final  91/98/7974 96:54 PM 7.07 (H) 0.61 - 1.24 mg/dL Final  91/98/7974 93:69 AM 7.77 (H) 0.61 - 1.24 mg/dL Final  92/68/7974 95:58 PM 7.92 (H) 0.61 - 1.24 mg/dL Final  92/68/7974 95:73 AM 7.87 (H) 0.61 - 1.24 mg/dL Final  92/69/7974 94:85 PM 7.02 (H) 0.61 - 1.24 mg/dL Final  92/69/7974 95:73 AM 5.74 (H) 0.61 - 1.24 mg/dL Final  92/69/7974 95:73 AM 5.83 (H) 0.61 - 1.24 mg/dL Final  92/70/7974 95:85 PM 4.99 (H) 0.61 - 1.24 mg/dL Final  92/70/7974 94:99 AM 5.46 (H) 0.61 - 1.24 mg/dL Final  92/71/7974 95:91 PM 6.98 (H) 0.61 - 1.24 mg/dL Final  92/71/7974 94:85 AM 10.17 (H) 0.61 - 1.24 mg/dL Final  92/71/7974 94:87 AM 10.14 (H) 0.61 - 1.24 mg/dL Final  92/72/7974 98:58 PM 8.81 (H) 0.61 - 1.24 mg/dL Final  92/72/7974 90:48 AM 8.69 (H) 0.61 - 1.24 mg/dL Final  94/90/7974 97:80 PM 1.06 0.61 - 1.24 mg/dL Final  98/72/7983 88:59 PM 1.04 0.50 - 1.35 mg/dL Final   Recent Labs  Lab 01/16/24 0426 01/16/24 1641 01/17/24 0630 01/17/24 1545 01/18/24 0556 01/18/24 1543 01/19/24 0530  NA 130* 131* 133* 134* 136 136 137  K 3.5 3.5 3.4* 3.3*  4.0 3.2* 3.3*  CL 96* 97* 97* 98 101 99 103  CO2 23 22 21* 22 20* 24 23  GLUCOSE 281* 340* 237* 273* 186* 219* 191*  BUN 57* 65* 73* 73* 76* 74* 70*  CREATININE 7.87* 7.92* 7.77* 7.07* 6.10* 5.12* 3.82*  CALCIUM 8.0* 8.2* 8.4* 8.4* 8.8* 8.7* 8.7*  PHOS 3.5 3.4 4.3 4.6 5.8* 5.3* 5.2*   Recent Labs  Lab 01/12/24 1119 01/12/24 1200 01/15/24 0426 01/16/24 0426 01/17/24 0630 01/18/24 0556  WBC 7.5   < > 6.3 6.2 7.2 6.6  NEUTROABS 5.8  --   --   --   --   --   HGB 11.7*   < > 10.5* 9.5* 10.2* 9.9*  HCT 32.5*   < > 29.4* 27.6* 29.4* 28.3*  MCV 81.7   < > 83.8 85.7 84.7 85.5  PLT 177   < > 238 305 436* 506*   < > = values in this interval not displayed.   Liver Function Tests: Recent Labs  Lab 01/13/24 0512 01/13/24 0514  01/14/24 0752 01/14/24 1614 01/15/24 0426 01/15/24 1714 01/18/24 0556 01/18/24 1543 01/19/24 0530  AST 437*  --  269*  --  195*  --   --   --   --   ALT 167*  --  136*  --  113*  --   --   --   --   ALKPHOS 66  --  61  --  61  --   --   --   --   BILITOT 1.4*  --  1.6*  --  1.4*  --   --   --   --   PROT 5.9*  --  6.2*  --  5.8*  --   --   --   --   ALBUMIN 2.3*   < > 2.4*   < > 2.3*  2.3*   < > 2.4* 2.4* 2.5*   < > = values in this interval not displayed.   Recent Labs  Lab 01/12/24 2049  LIPASE 163*   Recent Labs  Lab 01/13/24 0512  AMMONIA 46*   Cardiac Enzymes: Recent Labs  Lab 01/12/24 2049 01/13/24 0512 01/14/24 0752 01/15/24 0426  CKTOTAL 13,277* 10,078* 3,761* 4,013*   Iron Studies: No results for input(s): IRON, TIBC, TRANSFERRIN, FERRITIN in the last 72 hours. PT/INR: @LABRCNTIP (inr:5)  Xrays/Other Studies: ) Results for orders placed or performed during the hospital encounter of 01/12/24 (from the past 48 hours)  Glucose, capillary     Status: Abnormal   Collection Time: 01/17/24 12:29 PM  Result Value Ref Range   Glucose-Capillary 339 (H) 70 - 99 mg/dL    Comment: Glucose reference range applies only to samples taken after fasting for at least 8 hours.  Renal function panel (daily at 1600)     Status: Abnormal   Collection Time: 01/17/24  3:45 PM  Result Value Ref Range   Sodium 134 (L) 135 - 145 mmol/L   Potassium 3.3 (L) 3.5 - 5.1 mmol/L   Chloride 98 98 - 111 mmol/L   CO2 22 22 - 32 mmol/L   Glucose, Bld 273 (H) 70 - 99 mg/dL    Comment: Glucose reference range applies only to samples taken after fasting for at least 8 hours.   BUN 73 (H) 6 - 20 mg/dL   Creatinine, Ser 2.92 (H) 0.61 - 1.24 mg/dL   Calcium 8.4 (L) 8.9 - 10.3 mg/dL   Phosphorus 4.6 2.5 -  4.6 mg/dL   Albumin 2.4 (L) 3.5 - 5.0 g/dL   GFR, Estimated 8 (L) >60 mL/min    Comment: (NOTE) Calculated using the CKD-EPI Creatinine Equation (2021)    Anion gap 14 5 - 15     Comment: Performed at [facility], [address], [place], [place] [address]  Glucose, capillary     Status: Abnormal   Collection Time: 01/17/24  5:16 PM  Result Value Ref Range   Glucose-Capillary 309 (H) 70 - 99 mg/dL    Comment: Glucose reference range applies only to samples taken after fasting for at least 8 hours.  Glucose, capillary     Status: Abnormal   Collection Time: 01/17/24  9:02 PM  Result Value Ref Range   Glucose-Capillary 222 (H) 70 - 99 mg/dL    Comment: Glucose reference range applies only to samples taken after fasting for at least 8 hours.   Comment 1 Notify RN    Comment 2 Document in Chart   Renal function panel (daily at 0500)     Status: Abnormal   Collection Time: 01/18/24  5:56 AM  Result Value Ref Range   Sodium 136 135 - 145 mmol/L   Potassium 4.0 3.5 - 5.1 mmol/L   Chloride 101 98 - 111 mmol/L   CO2 20 (L) 22 - 32 mmol/L   Glucose, Bld 186 (H) 70 - 99 mg/dL    Comment: Glucose reference range applies only to samples taken after fasting for at least 8 hours.   BUN 76 (H) 6 - 20 mg/dL   Creatinine, Ser 3.89 (H) 0.61 - 1.24 mg/dL   Calcium 8.8 (L) 8.9 - 10.3 mg/dL   Phosphorus 5.8 (H) 2.5 - 4.6 mg/dL   Albumin 2.4 (L) 3.5 - 5.0 g/dL   GFR, Estimated 10 (L) >60 mL/min    Comment: (NOTE) Calculated using the CKD-EPI Creatinine Equation (2021)    Anion gap 15 5 - 15    Comment: Performed at [facility], [address], [place], [place] [address]  Magnesium      Status: None   Collection Time: 01/18/24  5:56 AM  Result Value Ref Range   Magnesium  2.0 1.7 - 2.4 mg/dL    Comment: Performed at [facility], [address] [place], [place] [address]  APTT     Status: None   Collection Time: 01/18/24  5:56 AM  Result Value Ref Range   aPTT 33 24 - 36 seconds    Comment: Performed at [facility], [address], [place], [place] [address]  CBC     Status: Abnormal   Collection Time: 01/18/24  5:56 AM  Result Value Ref Range    WBC 6.6 4.0 - 10.5 K/uL   RBC 3.31 (L) 4.22 - 5.81 MIL/uL   Hemoglobin 9.9 (L) 13.0 - 17.0 g/dL   HCT 71.6 (L) 60.9 - 47.9 %   MCV 85.5 80.0 - 100.0 fL   MCH 29.9 26.0 - 34.0 pg   MCHC 35.0 30.0 - 36.0 g/dL   RDW 86.0 88.4 - 84.4 %   Platelets 506 (H) 150 - 400 K/uL   nRBC 0.0 0.0 - 0.2 %    Comment: Performed at [facility], [address], [place], [place] [address]  Glucose, capillary     Status: Abnormal   Collection Time: 01/18/24  8:43 AM  Result Value Ref Range   Glucose-Capillary 207 (H) 70 - 99 mg/dL  Comment: Glucose reference range applies only to samples taken after fasting for at least 8 hours.  Glucose, capillary     Status: Abnormal   Collection Time: 01/18/24 12:07 PM  Result Value Ref Range   Glucose-Capillary 285 (H) 70 - 99 mg/dL    Comment: Glucose reference range applies only to samples taken after fasting for at least 8 hours.  Renal function panel (daily at 1600)     Status: Abnormal   Collection Time: 01/18/24  3:43 PM  Result Value Ref Range   Sodium 136 135 - 145 mmol/L   Potassium 3.2 (L) 3.5 - 5.1 mmol/L   Chloride 99 98 - 111 mmol/L   CO2 24 22 - 32 mmol/L   Glucose, Bld 219 (H) 70 - 99 mg/dL    Comment: Glucose reference range applies only to samples taken after fasting for at least 8 hours.   BUN 74 (H) 6 - 20 mg/dL   Creatinine, Ser 4.87 (H) 0.61 - 1.24 mg/dL   Calcium 8.7 (L) 8.9 - 10.3 mg/dL   Phosphorus 5.3 (H) 2.5 - 4.6 mg/dL   Albumin 2.4 (L) 3.5 - 5.0 g/dL   GFR, Estimated 12 (L) >60 mL/min    Comment: (NOTE) Calculated using the CKD-EPI Creatinine Equation (2021)    Anion gap 13 5 - 15    Comment: Performed at [facility], [address] [place], [place] [address]  Glucose, capillary     Status: Abnormal   Collection Time: 01/18/24  4:31 PM  Result Value Ref Range   Glucose-Capillary 229 (H) 70 - 99 mg/dL    Comment: Glucose reference range applies only to samples taken after fasting for at least 8 hours.   Glucose, capillary     Status: Abnormal   Collection Time: 01/18/24  8:53 PM  Result Value Ref Range   Glucose-Capillary 247 (H) 70 - 99 mg/dL    Comment: Glucose reference range applies only to samples taken after fasting for at least 8 hours.  Renal function panel (daily at 0500)     Status: Abnormal   Collection Time: 01/19/24  5:30 AM  Result Value Ref Range   Sodium 137 135 - 145 mmol/L   Potassium 3.3 (L) 3.5 - 5.1 mmol/L   Chloride 103 98 - 111 mmol/L   CO2 23 22 - 32 mmol/L   Glucose, Bld 191 (H) 70 - 99 mg/dL    Comment: Glucose reference range applies only to samples taken after fasting for at least 8 hours.   BUN 70 (H) 6 - 20 mg/dL   Creatinine, Ser 6.17 (H) 0.61 - 1.24 mg/dL   Calcium 8.7 (L) 8.9 - 10.3 mg/dL   Phosphorus 5.2 (H) 2.5 - 4.6 mg/dL   Albumin 2.5 (L) 3.5 - 5.0 g/dL   GFR, Estimated 17 (L) >60 mL/min    Comment: (NOTE) Calculated using the CKD-EPI Creatinine Equation (2021)    Anion gap 11 5 - 15    Comment: Performed at [facility], [address], [place], [place] [address]  Magnesium      Status: None   Collection Time: 01/19/24  5:30 AM  Result Value Ref Range   Magnesium  1.7 1.7 - 2.4 mg/dL    Comment: Performed at [facility], [address] [place], [place] [address]  APTT     Status: None   Collection Time: 01/19/24  5:30 AM  Result Value Ref Range   aPTT 31 24 - 36  seconds    Comment: Performed at [facility], [address] [place], [place] [address]  Glucose, capillary     Status: Abnormal   Collection Time: 01/19/24  8:25 AM  Result Value Ref Range   Glucose-Capillary 176 (H) 70 - 99 mg/dL    Comment: Glucose reference range applies only to samples taken after fasting for at least 8 hours.   MR BRAIN WO CONTRAST Result Date: 01/17/2024 CLINICAL DATA:  Provided history: Seizure disorder, clinical change. EXAM: MRI HEAD WITHOUT CONTRAST TECHNIQUE: Multiplanar, multiecho pulse sequences of the brain and surrounding  structures were obtained without intravenous contrast. COMPARISON:  Head CT 01/13/2024. FINDINGS: Intermittently motion degraded examination. Most notably, the axial FLAIR sequence is moderate-to-severely motion degraded, the SWI sequence is severely motion degraded, the coronal whole brain T2 sequence is severely motion degraded, the coronal T2 sequence through the hippocampi is mild-to-moderately motion degraded and the coronal FLAIR sequence through the hippocampi is moderately motion degraded. Within this limitation, findings are as follows. Brain: No age-advanced or lobar predominant cerebral atrophy. No cortical encephalomalacia is identified. No appreciable hippocampal size or signal asymmetry. There is no acute infarct. No evidence of an intracranial mass. Severe motion degradation of the SWI sequence precludes adequate evaluation for chronic intracranial blood products. No extra-axial fluid collection. No midline shift. Vascular: Maintained flow voids within the proximal large arterial vessels. Skull and upper cervical spine: No focal worrisome marrow lesion. Sinuses/Orbits: No mass or acute finding within the imaged orbits. No significant paranasal sinus disease. 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 IMPRESSION: 1. Significantly motion degraded examination. 2. Within this limitation, there is no evidence of an acute intracranial abnormality and no specific seizure etiology is identified. 3. 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 Electronically Signed   By: Rockey Childs D.O.   On: 01/17/2024 20:18   EEG adult Result Date: 01/17/2024 Gregg Lek, MD     01/17/2024 11:18 AM Patient Name: Adam Beasley MRN: [id number] Epilepsy Attending: Lek Gregg Referring Physician/Provider: No ref. provider found     Date: 01/17/2024 Duration: 23 minutes Patient history: 58 year old past medical history of diabetes mellitus type 2 and hypertension presenting at bedside or [place] acutely ill with  nausea vomiting diarrhea x 4 days along with shortness of breath and fatigue. Level of alertness: Awake, drowsy AEDs during EEG study: Technical aspects: This EEG study was done with scalp electrodes positioned according to the 10-20 International system of electrode placement. Electrical activity was reviewed with band pass filter of 1-70Hz , sensitivity of 7 uV/mm, display speed of 21mm/sec with a 60Hz  notched filter applied as appropriate. EEG data were recorded continuously and digitally stored.  Video monitoring was available and reviewed as appropriate. Description: The posterior dominant rhythm consists of 9 Hz activity of moderate voltage (25-35 uV) seen predominantly in posterior head regions, symmetric and reactive to eye opening and eye closing. Drowsiness was characterized by attenuation of the posterior background rhythm. Sleep was not seen. Hyperventilation and photic stimulation were not performed.   ABNORMALITY -None IMPRESSION: This study is within normal limits. No seizures or epileptiform discharges were seen throughout the recording. A normal interictal EEG does not exclude nor support the diagnosis of epilepsy. 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 No pertinent surgical history.  Allergies: No Known Allergies  Medications:   Prior to Admission medications   Medication Sig Start Date End Date Taking? Authorizing Provider  amLODipine  (NORVASC ) 5 MG tablet Take 1 tablet (5 mg total) by mouth daily. 08/29/23  Yes Thapa, [place], MD  atorvastatin (LIPITOR) 80 MG tablet Take 80 mg by mouth daily.   Yes [provider]  doxazosin  (CARDURA ) 2 MG tablet Take 2 mg by mouth daily.   Yes [provider]  eplerenone  (INSPRA ) 50 MG tablet Take 1 tablet (50 mg total) by mouth  daily. 08/28/23  Yes Thapa, [place], MD  metFORMIN (GLUCOPHAGE) 500 MG tablet Take 500 mg by mouth daily with breakfast.   Yes [provider]  azithromycin  (ZITHROMAX ) 250 MG tablet Take 1 tablet (250 mg total) by mouth daily. 07/15/14   Terryl Kubas, NP  chlorpheniramine-HYDROcodone [facility] PENNKINETIC ER) 10-8 MG/5ML LQCR Take 5 mLs by mouth 2 (two) times daily. 07/15/14   Terryl Kubas, NP  doxazosin  (CARDURA ) 1 MG tablet Take 2 tablets (2 mg total) by mouth daily. Patient not taking: Reported on 01/12/2024 08/28/23   Thapa, [place], MD    Discontinued Meds:   Medications Discontinued During This Encounter  Medication Reason   acetaminophen  (TYLENOL ) tablet 650 mg    atorvastatin (LIPITOR) 40 MG tablet Discontinued by provider   ezetimibe (ZETIA) 10 MG tablet Discontinued by provider   doxazosin  (CARDURA ) 2 MG tablet Discontinued by provider   spironolactone  (ALDACTONE ) 100 MG tablet Discontinued by provider   verapamil  (CALAN -SR) 180 MG CR tablet Discontinued by provider   metFORMIN (GLUCOPHAGE) 500 MG tablet Discontinued by provider   lactated ringers  infusion    metroNIDAZOLE  (FLAGYL ) IVPB 500 mg    ceFEPIme (MAXIPIME) 2 g in sodium chloride  0.9 % 100 mL IVPB    famotidine  (PEPCID ) IVPB 20 mg premix    azithromycin  (ZITHROMAX ) 500 mg in sodium chloride  0.9 % 250 mL IVPB    metroNIDAZOLE  (FLAGYL ) IVPB 500 mg    LORazepam  (ATIVAN ) injection 2 mg    famotidine  (PEPCID ) tablet 20 mg    famotidine  (PEPCID ) tablet 20 mg    0.9 %  sodium chloride  infusion    metroNIDAZOLE  (FLAGYL ) tablet 500 mg    heparin  injection 5,000 Units    sodium bicarbonate  150 mEq in sterile water  1,150 mL infusion    hydrALAZINE  (APRESOLINE ) injection 10 mg    amLODipine  (NORVASC ) tablet 5 mg    heparin  10,000 units/ 20 mL infusion syringe    insulin  aspart (novoLOG ) injection 0-6 Units    prismasol  BGK 4/2.5 infusion    prismasol  BGK 4/2.5 infusion    prismasol  BGK 4/2.5 infusion    insulin  aspart  (novoLOG ) injection 0-15 Units    insulin  glargine-yfgn (SEMGLEE ) injection 10 Units    hydrALAZINE  (APRESOLINE ) injection 20 mg    feeding supplement (ENSURE PLUS HIGH PROTEIN) liquid 237 mL     Social History:  reports that he has never smoked. He has never used smokeless tobacco. He reports that he does not drink alcohol. No history on file for drug use.  Family History:  History reviewed. No pertinent family history.  Blood pressure (!) 150/92, pulse 80, temperature 98.6 F (37 C), temperature source Oral, resp. rate 20, height 6' (1.829 m), weight 104 kg, SpO2 92%. Physical Exam: Gen NAD, awake and conversant,   O2 No rash, cyanosis or gangrene Sclera anicteric, throat clear  No jvd or bruits Chest clear bilat to bases  RRR no MRG Abd soft ntnd no mass or ascites +bs GU foley cath in place Ext no LE or UE edema, no other edema Access LIJ temp     Ambriella Kitt, LYNWOOD ORN, MD 01/19/2024, 10:13 AM

## 2024-01-19 NOTE — Plan of Care (Signed)
  Problem: Education: Goal: Knowledge of General Education information will improve Description: Including pain rating scale, medication(s)/side effects and non-pharmacologic comfort measures Outcome: Progressing   Problem: Health Behavior/Discharge Planning: Goal: Ability to manage health-related needs will improve Outcome: Progressing   Problem: Clinical Measurements: Goal: Ability to maintain clinical measurements within normal limits will improve Outcome: Progressing Goal: Will remain free from infection Outcome: Progressing Goal: Diagnostic test results will improve Outcome: Progressing Goal: Respiratory complications will improve Outcome: Progressing Goal: Cardiovascular complication will be avoided Outcome: Progressing   Problem: Activity: Goal: Risk for activity intolerance will decrease Outcome: Progressing   Problem: Nutrition: Goal: Adequate nutrition will be maintained Outcome: Progressing   Problem: Coping: Goal: Level of anxiety will decrease Outcome: Progressing   Problem: Elimination: Goal: Will not experience complications related to bowel motility Outcome: Progressing Goal: Will not experience complications related to urinary retention Outcome: Progressing   Problem: Pain Managment: Goal: General experience of comfort will improve and/or be controlled Outcome: Progressing   Problem: Safety: Goal: Ability to remain free from injury will improve Outcome: Progressing   Problem: Skin Integrity: Goal: Risk for impaired skin integrity will decrease Outcome: Progressing   Problem: Education: Goal: Ability to describe self-care measures that may prevent or decrease complications (Diabetes Survival Skills Education) will improve Outcome: Progressing Goal: Individualized Educational Video(s) Outcome: Progressing   Problem: Coping: Goal: Ability to adjust to condition or change in health will improve Outcome: Progressing   Problem: Fluid  Volume: Goal: Ability to maintain a balanced intake and output will improve Outcome: Progressing   Problem: Health Behavior/Discharge Planning: Goal: Ability to identify and utilize available resources and services will improve Outcome: Progressing Goal: Ability to manage health-related needs will improve Outcome: Progressing   Problem: Metabolic: Goal: Ability to maintain appropriate glucose levels will improve Outcome: Progressing   Problem: Nutritional: Goal: Maintenance of adequate nutrition will improve Outcome: Progressing   Problem: Skin Integrity: Goal: Risk for impaired skin integrity will decrease Outcome: Progressing   Problem: Tissue Perfusion: Goal: Adequacy of tissue perfusion will improve Outcome: Progressing   Problem: Fluid Volume: Goal: Hemodynamic stability will improve Outcome: Progressing   Problem: Clinical Measurements: Goal: Diagnostic test results will improve Outcome: Progressing Goal: Signs and symptoms of infection will decrease Outcome: Progressing   Problem: Respiratory: Goal: Ability to maintain adequate ventilation will improve Outcome: Progressing   Problem: Activity: Goal: Ability to tolerate increased activity will improve Outcome: Progressing   Problem: Clinical Measurements: Goal: Ability to maintain a body temperature in the normal range will improve Outcome: Progressing   Problem: Respiratory: Goal: Ability to maintain adequate ventilation will improve Outcome: Progressing Goal: Ability to maintain a clear airway will improve Outcome: Progressing

## 2024-01-19 NOTE — Plan of Care (Signed)

## 2024-01-19 NOTE — Progress Notes (Signed)
 CVC was removed per order with no complications.  Pt lying horizontal and supine and suspended inspiration during removal with instructions to remain horizontal for 30 minutes after removal.  Pressure held to achieve hemostasis.  Vaseline/gauze/tegaderm applied.  Patient education provided regarding lifting restrictions, site care, and signs of infection.  Pt verbalized understanding and had no questions.

## 2024-01-19 NOTE — Progress Notes (Signed)
 PROGRESS NOTE                                                                                                                                                                                                             Patient Demographics:    Adam Beasley, is a 58 y.o. male, DOB - 1966-03-24, FMW:[id number]  Outpatient Primary MD for the patient is [name], MD    LOS - 7  Admit date - 01/12/2024    Chief Complaint  Patient presents with   Cough       Brief Narrative (HPI from H&P)   58 yo male [facility] with c/o n/v/d x 4 days. Pt also reported sob and general fatigue at time of presentation. He denies documented fever/chills, no accompanying abdominal pain. Upon presentation pt was noted to actually have fever, was tachycardic and noted to have elevated liver enzymes with normal lactate as well as in acute renal failure with BUN/Cr ~50/>8. He was subsequently transferred to [facility], he developed renal failure requiring dialysis, nephrology was consulted he was diagnosed with Legionella pneumonia, transaminitis, he was admitted to ICU, seen by GI, nephrology underwent CRRT, he also had a seizure during this hospital stay.  Transferred to my care on 01/17/2024 on day 5 of hospital stay    Significant Hospital Events: Including procedures, antibiotic start and stop dates in addition to other pertinent events   7/27 - Presented to [facility], transferred to [facility] for admission, later transferred to ICU for initiation of crrt.  7/29 - CRRT discontinued 7/30 - legionella positive 01/17/2024.  EEG nonacute 01/17/2024 MRI brain limited study but nonacute except for scalp lipoma     Subjective:   Patient in bed, appears comfortable, denies any headache, no fever, no chest pain or pressure, no shortness of breath , no abdominal pain. No new focal weakness.    Assessment  & Plan :    Acute hypoxic respiratory  failure, sepsis, Legionella pneumonia all present upon admission.  Seen by PCCM, treated with empiric antibiotics completed 10-day course of azithromycin , sepsis pathophysiology has resolved currently on room air and improving continue to monitor encouraged to sit in chair use I-S and flutter valve.   Rhabdomyolysis with AKI, hypokalemia.  Nephrology on board, has left IJ dialysis catheter, underwent CRRT, defer management to nephrology  service, electrophoresis results per Dr. Melia suggest atypical pattern.  Continue to monitor renal function defer management to nephrology, will discontinue Foley catheter on 01/18/2024.  Renal function seems to be improving, potassium replaced for hypokalemia.  Seizure initially during his hospital stay.  Discussed with neurology, obtained MRI brain and EEG which were nonacute, MRI was limited but no acute findings except scalp lipoma, previously had negative head CT.  Currently seizure-free, likely due to metabolic derangements.  No headache or focal deficits, stable with outpatient neuro follow-up.  Will not drive for 6 months.  Asymptomatic transaminitis.  Likely due to combination of rhabdomyolysis, Legionella pneumonia and over-the-counter herbal supplements, appreciate GI input seen by [facility].  Trend and monitor.  Suspected OSA.  Outpatient sleep study and pulmonary follow-up postdischarge.  Hypertension.  Currently on Norvasc  and prazosin, add hydralazine  as needed.  Dyslipidemia.  Due to transaminitis statin on hold.  DM type II.  On Semglee  and sliding scale.  Will adjust further on 01/18/2024 for better control, monitor.  Lab Results  Component Value Date   HGBA1C 7.6 (H) 01/12/2024   CBG (last 3)  Recent Labs    01/18/24 1207 01/18/24 1631 01/18/24 2053  GLUCAP 285* 229* 247*        Condition - Extremely Guarded  Family Communication  : Wife bedside on 01/17/2024, 01/18/2024, 01/19/2024  Code Status : Code  Consults  : PCCM, nephrology,  gastroenterology  PUD Prophylaxis :    Procedures  :            Disposition Plan  :    Status is: Inpatient   DVT Prophylaxis  :    heparin  injection 5,000 Units Start: 01/14/24 1400 SCDs Start: 01/12/24 1655    Lab Results  Component Value Date   PLT 506 (H) 01/18/2024    Diet :  Diet Order             Diet Carb Modified Fluid consistency: Thin; Room service appropriate? Yes  Diet effective now                    Inpatient Medications  Scheduled Meds:  amLODipine   10 mg Oral Daily   azithromycin   500 mg Oral Daily   Chlorhexidine  Gluconate Cloth  6 each Topical Daily   doxazosin   2 mg Oral Daily   feeding supplement (GLUCERNA SHAKE)  237 mL Oral TID BM   heparin   5,000 Units Subcutaneous Q8H   insulin  aspart  0-5 Units Subcutaneous QHS   insulin  aspart  0-9 Units Subcutaneous TID WC   insulin  aspart  2 Units Subcutaneous TID WC   insulin  glargine-yfgn  20 Units Subcutaneous Daily   isosorbide  mononitrate  30 mg Oral Daily   multivitamin  1 tablet Oral QHS   potassium chloride   30 mEq Oral Once   Continuous Infusions: PRN Meds:.guaiFENesin , heparin , hydrALAZINE , morphine  injection, mouth rinse     Objective:   Vitals:   01/19/24 0000 01/19/24 0200 01/19/24 0400 01/19/24 0500  BP: 129/76  (!) 151/97   Pulse: 84 76 74   Resp: 19 19 19    Temp: 98.2 F (36.8 C)  98 F (36.7 C)   TempSrc: Oral  Oral   SpO2: 90% 92% 91%   Weight:    104 kg  Height:        Wt Readings from Last 3 Encounters:  01/19/24 104 kg  12/05/23 111.8 kg  10/25/23 106.6 kg     Intake/Output Summary (Last  24 hours) at 01/19/2024 0728 Last data filed at 01/19/2024 0500 Gross per 24 hour  Intake 720 ml  Output 3160 ml  Net -2440 ml     Physical Exam  Awake Alert, No new F.N deficits, Normal affect, left IJ dialysis catheter in place [place].AT,PERRAL Supple Neck, No JVD,   Symmetrical Chest wall movement, Good air movement bilaterally, CTAB RRR,No Gallops,Rubs or  new Murmurs,  +ve B.Sounds, Abd Soft, No tenderness,   No Cyanosis, Clubbing or edema       Data Review:    Recent Labs  Lab 01/12/24 1119 01/12/24 1200 01/14/24 0914 01/15/24 0426 01/16/24 0426 01/17/24 0630 01/18/24 0556  WBC 7.5   < > 7.2 6.3 6.2 7.2 6.6  HGB 11.7*   < > 11.3* 10.5* 9.5* 10.2* 9.9*  HCT 32.5*   < > 31.7* 29.4* 27.6* 29.4* 28.3*  PLT 177   < > 201 238 305 436* 506*  MCV 81.7   < > 83.4 83.8 85.7 84.7 85.5  MCH 29.4   < > 29.7 29.9 29.5 29.4 29.9  MCHC 36.0   < > 35.6 35.7 34.4 34.7 35.0  RDW 13.4   < > 13.9 13.8 13.9 13.7 13.9  LYMPHSABS 0.8  --   --   --   --   --   --   MONOABS 0.7  --   --   --   --   --   --   EOSABS 0.0  --   --   --   --   --   --   BASOSABS 0.0  --   --   --   --   --   --    < > = values in this interval not displayed.    Recent Labs  Lab 01/12/24 [id number] 01/12/24 0959 01/12/24 1119 01/12/24 1200 01/12/24 1440 01/12/24 1441 01/12/24 1445 01/12/24 2049 01/13/24 9487 01/13/24 9485 01/14/24 9247 01/14/24 9246 01/15/24 9573 01/15/24 1714 01/16/24 0426 01/16/24 1641 01/17/24 0630 01/17/24 1545 01/18/24 0556 01/18/24 1543 01/19/24 0530  NA 127*  --   --    < >  --    < >  --   --  133*   < >  --    < > 131*  131*   < > 130*   < > 133* 134* 136 136 137  K 3.6  --   --    < >  --    < >  --   --  3.4*   < >  --    < > 3.4*  3.4*   < > 3.5   < > 3.4* 3.3* 4.0 3.2* 3.3*  CL 91*  --   --    < >  --   --   --   --  100   < >  --    < > 99  99   < > 96*   < > 97* 98 101 99 103  CO2 17*  --   --    < >  --   --   --   --  17*   < >  --    < > 22  22   < > 23   < > 21* 22 20* 24 23  ANIONGAP 18*  --   --    < >  --   --   --   --  16*   < >  --    < >  10  10   < > 11   < > 15 14 15 13 11   GLUCOSE 328*  --   --    < >  --   --   --   --  264*   < >  --    < > 215*  214*   < > 281*   < > 237* 273* 186* 219* 191*  BUN 51*  --   --    < >  --   --   --   --  64*   < >  --    < > 38*  37*   < > 57*   < > 73* 73* 76* 74* 70*   CREATININE 8.69*  --   --    < >  --   --   --   --  10.14*   < >  --    < > 5.83*  5.74*   < > 7.87*   < > 7.77* 7.07* 6.10* 5.12* 3.82*  AST 834*  --  830*  --   --   --   --   --  437*  --  269*  --  195*  --   --   --   --   --   --   --   --   ALT 263*  --  264*  --   --   --   --   --  167*  --  136*  --  113*  --   --   --   --   --   --   --   --   ALKPHOS 78  --  77  --   --   --   --   --  66  --  61  --  61  --   --   --   --   --   --   --   --   BILITOT 2.2*  --  2.1*  --   --   --   --   --  1.4*  --  1.6*  --  1.4*  --   --   --   --   --   --   --   --   ALBUMIN 3.6  --  3.6  --   --   --   --   --  2.3*   < > 2.4*   < > 2.3*  2.3*   < > 2.1*   < > 2.4* 2.4* 2.4* 2.4* 2.5*  CRP  --   --   --   --  31.7*  --   --   --   --   --   --   --   --   --   --   --   --   --   --   --   --   PROCALCITON  --   --   --   --   --   --   --  17.42  --   --   --   --   --   --   --   --   --   --   --   --   --   LATICACIDVEN  --  1.6  --   --   --   --  1.6  --   --   --   --   --   --   --   --   --   --   --   --   --   --  INR  --   --  1.1  --   --   --   --   --  1.2  --   --   --   --   --   --   --   --   --   --   --   --   HGBA1C  --   --   --   --   --   --   --  7.6*  --   --   --   --   --   --   --   --   --   --   --   --   --   AMMONIA  --   --   --   --   --   --   --   --  46*  --   --   --   --   --   --   --   --   --   --   --   --   MG  --   --   --    < >  --   --   --   --  2.4  --   --    < > 2.4  --  2.5*  --  2.2  --  2.0  --  1.7  PHOS  --   --   --   --   --   --   --   --   --    < >  --    < > 1.9*   < > 3.5   < > 4.3 4.6 5.8* 5.3* 5.2*  CALCIUM 8.6*  --   --    < >  --   --   --   --  7.1*   < >  --    < > 7.8*  7.9*   < > 8.0*   < > 8.4* 8.4* 8.8* 8.7* 8.7*   < > = values in this interval not displayed.      Recent Labs  Lab 01/12/24 0959 01/12/24 1119 01/12/24 1341 01/12/24 1440 01/12/24 1445 01/12/24 2049 01/13/24 0512 01/13/24 0514  01/15/24 0426 01/15/24 1714 01/16/24 0426 01/16/24 1641 01/17/24 0630 01/17/24 1545 01/18/24 0556 01/18/24 1543 01/19/24 0530  CRP  --   --   --  31.7*  --   --   --   --   --   --   --   --   --   --   --   --   --   PROCALCITON  --   --   --   --   --  17.42  --   --   --   --   --   --   --   --   --   --   --   LATICACIDVEN 1.6  --   --   --  1.6  --   --   --   --   --   --   --   --   --   --   --   --   INR  --  1.1  --   --   --   --  1.2  --   --   --   --   --   --   --   --   --   --  HGBA1C  --   --   --   --   --  7.6*  --   --   --   --   --   --   --   --   --   --   --   AMMONIA  --   --   --   --   --   --  46*  --   --   --   --   --   --   --   --   --   --   MG  --   --    < >  --   --   --  2.4   < > 2.4  --  2.5*  --  2.2  --  2.0  --  1.7  CALCIUM  --   --    < >  --   --   --  7.1*   < > 7.8*  7.9*   < > 8.0*   < > 8.4* 8.4* 8.8* 8.7* 8.7*   < > = values in this interval not displayed.    --------------------------------------------------------------------------------------------------------------- No results found for: CHOL, HDL, LDLCALC, LDLDIRECT, TRIG, CHOLHDL  Lab Results  Component Value Date   HGBA1C 7.6 (H) 01/12/2024      Micro Results Recent Results (from the past 240 hours)  MRSA Next Gen by PCR, Nasal     Status: None   Collection Time: 01/12/24 12:14 AM   Specimen: Nasal Mucosa; Nasal Swab  Result Value Ref Range Status   MRSA by PCR Next Gen NOT DETECTED NOT DETECTED Final    Comment: (NOTE) The GeneXpert MRSA Assay (FDA approved for NASAL specimens only), is one component of a comprehensive MRSA colonization surveillance program. It is not intended to diagnose MRSA infection nor to guide or monitor treatment for MRSA infections. Test performance is not FDA approved in patients less than 14 years old. Performed at [facility], [address], [place], [place] [address]   Blood Culture (routine x 2)     Status:  None   Collection Time: 01/12/24 10:01 AM   Specimen: BLOOD  Result Value Ref Range Status   Specimen Description   Final    BLOOD RIGHT ANTECUBITAL Performed at [facility], [address] [place], [place] [address]    Special Requests   Final    BOTTLES DRAWN AEROBIC AND ANAEROBIC Blood Culture adequate volume Performed at [facility], [address] [place], [place] [address]    Culture   Final    NO GROWTH 5 DAYS Performed at [facility], [address], [place], [place] [address]    Report Status 01/17/2024 FINAL  Final  Blood Culture (routine x 2)     Status: None   Collection Time: 01/12/24 10:12 AM   Specimen: BLOOD  Result Value Ref Range Status   Specimen Description   Final    BLOOD LEFT ANTECUBITAL Performed at [facility], [address] [place], [place] [address]    Special Requests   Final    BOTTLES DRAWN AEROBIC AND ANAEROBIC Blood Culture adequate volume Performed at [facility], [address] [place], [place] [address]    Culture   Final    NO GROWTH 5 DAYS Performed at [facility], [address] [place], [place] [address]  Report Status 01/17/2024 FINAL  Final  Resp panel by RT-PCR (RSV, Flu A&B, Covid) Anterior Nasal Swab     Status: None   Collection Time: 01/12/24 12:30 PM   Specimen: Anterior Nasal Swab  Result Value Ref Range Status   SARS Coronavirus 2 by RT PCR NEGATIVE NEGATIVE Final    Comment: (NOTE) SARS-CoV-2 target nucleic acids are NOT DETECTED.  The SARS-CoV-2 RNA is generally detectable in upper respiratory specimens during the acute phase of infection. The lowest concentration of SARS-CoV-2 viral copies this assay can detect is 138 copies/mL. A negative result does not preclude SARS-Cov-2 infection and should not be used as the sole basis for treatment or other patient management decisions. A negative result may occur with  improper specimen collection/handling,  submission of specimen other than nasopharyngeal swab, presence of viral mutation(s) within the areas targeted by this assay, and inadequate number of viral copies(<138 copies/mL). A negative result must be combined with clinical observations, patient history, and epidemiological information. The expected result is Negative.  Fact Sheet for Patients:  BloggerCourse.com  Fact Sheet for Healthcare Providers:  SeriousBroker.it  This test is no t yet approved or cleared by the [place]  FDA and  has been authorized for detection and/or diagnosis of SARS-CoV-2 by FDA under an Emergency Use Authorization (EUA). This EUA will remain  in effect (meaning this test can be used) for the duration of the COVID-19 declaration under Section 564(b)(1) of the Act, 21 U.S.C.section 360bbb-3(b)(1), unless the authorization is terminated  or revoked sooner.       Influenza A by PCR NEGATIVE NEGATIVE Final   Influenza B by PCR NEGATIVE NEGATIVE Final    Comment: (NOTE) The Xpert Xpress SARS-CoV-2/FLU/RSV plus assay is intended as an aid in the diagnosis of influenza from Nasopharyngeal swab specimens and should not be used as a sole basis for treatment. Nasal washings and aspirates are unacceptable for Xpert Xpress SARS-CoV-2/FLU/RSV testing.  Fact Sheet for Patients: BloggerCourse.com  Fact Sheet for Healthcare Providers: SeriousBroker.it  This test is not yet approved or cleared by the [place]  FDA and has been authorized for detection and/or diagnosis of SARS-CoV-2 by FDA under an Emergency Use Authorization (EUA). This EUA will remain in effect (meaning this test can be used) for the duration of the COVID-19 declaration under Section 564(b)(1) of the Act, 21 U.S.C. section 360bbb-3(b)(1), unless the authorization is terminated or revoked.     Resp Syncytial Virus by PCR NEGATIVE  NEGATIVE Final    Comment: (NOTE) Fact Sheet for Patients: BloggerCourse.com  Fact Sheet for Healthcare Providers: SeriousBroker.it  This test is not yet approved or cleared by the [place]  FDA and has been authorized for detection and/or diagnosis of SARS-CoV-2 by FDA under an Emergency Use Authorization (EUA). This EUA will remain in effect (meaning this test can be used) for the duration of the COVID-19 declaration under Section 564(b)(1) of the Act, 21 U.S.C. section 360bbb-3(b)(1), unless the authorization is terminated or revoked.  Performed at [facility], [address] [place], [place] [address]   Urine Culture (for pregnant, neutropenic or urologic patients or patients with an indwelling urinary catheter)     Status: None   Collection Time: 01/12/24  5:00 PM   Specimen: Urine, Clean Catch  Result Value Ref Range Status   Specimen Description URINE, CLEAN CATCH  Final   Special Requests NONE  Final   Culture   Final    NO GROWTH Performed at [facility], [address] [place], [place] [address]    Report Status 01/14/2024 FINAL  Final  Gastrointestinal Panel by PCR , Stool     Status: None   Collection Time: 01/13/24  8:50 AM   Specimen: Stool  Result Value Ref Range Status   Campylobacter species NOT DETECTED NOT DETECTED Final   Plesimonas shigelloides NOT DETECTED NOT DETECTED Final   Salmonella species NOT DETECTED NOT DETECTED Final   Yersinia enterocolitica NOT DETECTED NOT DETECTED Final   Vibrio species NOT DETECTED NOT DETECTED Final   Vibrio cholerae NOT DETECTED NOT DETECTED Final   Enteroaggregative E coli (EAEC) NOT DETECTED NOT DETECTED Final   Enteropathogenic E coli (EPEC) NOT DETECTED NOT DETECTED Final   Enterotoxigenic E coli (ETEC) NOT DETECTED NOT DETECTED Final   Shiga like toxin producing E coli (STEC) NOT DETECTED NOT DETECTED Final   Shigella/Enteroinvasive E coli  (EIEC) NOT DETECTED NOT DETECTED Final   Cryptosporidium NOT DETECTED NOT DETECTED Final   Cyclospora cayetanensis NOT DETECTED NOT DETECTED Final   Entamoeba histolytica NOT DETECTED NOT DETECTED Final   Giardia lamblia NOT DETECTED NOT DETECTED Final   Adenovirus F40/41 NOT DETECTED NOT DETECTED Final   Astrovirus NOT DETECTED NOT DETECTED Final   Norovirus GI/GII NOT DETECTED NOT DETECTED Final   Rotavirus A NOT DETECTED NOT DETECTED Final   Sapovirus (I, II, IV, and V) NOT DETECTED NOT DETECTED Final    Comment: Performed at [facility], [address] [place], [place] [address]  Respiratory (~20 pathogens) panel by PCR     Status: None   Collection Time: 01/14/24  8:09 AM   Specimen: Nasopharyngeal Swab; Respiratory  Result Value Ref Range Status   Adenovirus NOT DETECTED NOT DETECTED Final   Coronavirus 229E NOT DETECTED NOT DETECTED Final    Comment: (NOTE) The Coronavirus on the Respiratory Panel, DOES NOT test for the novel  Coronavirus (2019 nCoV)    Coronavirus HKU1 NOT DETECTED NOT DETECTED Final   Coronavirus NL63 NOT DETECTED NOT DETECTED Final   Coronavirus OC43 NOT DETECTED NOT DETECTED Final   Metapneumovirus NOT DETECTED NOT DETECTED Final   Rhinovirus / Enterovirus NOT DETECTED NOT DETECTED Final   Influenza A NOT DETECTED NOT DETECTED Final   Influenza B NOT DETECTED NOT DETECTED Final   Parainfluenza Virus 1 NOT DETECTED NOT DETECTED Final   Parainfluenza Virus 2 NOT DETECTED NOT DETECTED Final   Parainfluenza Virus 3 NOT DETECTED NOT DETECTED Final   Parainfluenza Virus 4 NOT DETECTED NOT DETECTED Final   Respiratory Syncytial Virus NOT DETECTED NOT DETECTED Final   Bordetella pertussis NOT DETECTED NOT DETECTED Final   Bordetella Parapertussis NOT DETECTED NOT DETECTED Final   Chlamydophila pneumoniae NOT DETECTED NOT DETECTED Final   Mycoplasma pneumoniae NOT DETECTED NOT DETECTED Final    Comment: Performed at [facility], [address], [place], [place] [address]    Radiology Report MR BRAIN WO CONTRAST Result Date: 01/17/2024 CLINICAL DATA:  Provided history: Seizure disorder, clinical change. EXAM: MRI HEAD WITHOUT CONTRAST TECHNIQUE: Multiplanar, multiecho pulse sequences of the brain and surrounding structures were obtained without intravenous contrast. COMPARISON:  Head CT 01/13/2024. FINDINGS: Intermittently motion degraded examination. Most notably, the axial FLAIR sequence is moderate-to-severely motion degraded, the SWI sequence is severely motion degraded, the coronal whole brain T2 sequence is severely motion degraded, the coronal T2 sequence through the hippocampi is mild-to-moderately motion degraded and the coronal FLAIR sequence through the hippocampi is moderately  motion degraded. Within this limitation, findings are as follows. Brain: No age-advanced or lobar predominant cerebral atrophy. No cortical encephalomalacia is identified. No appreciable hippocampal size or signal asymmetry. There is no acute infarct. No evidence of an intracranial mass. Severe motion degradation of the SWI sequence precludes adequate evaluation for chronic intracranial blood products. No extra-axial fluid collection. No midline shift. Vascular: Maintained flow voids within the proximal large arterial vessels. Skull and upper cervical spine: No focal worrisome marrow lesion. Sinuses/Orbits: No mass or acute finding within the imaged orbits. No significant paranasal sinus disease. 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 IMPRESSION: 1. Significantly motion degraded examination. 2. Within this limitation, there is no evidence of an acute intracranial abnormality and no specific seizure etiology is identified. 3. 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 Electronically Signed   By: Rockey Childs D.O.   On: 01/17/2024 20:18   EEG adult Result Date: 01/17/2024 Gregg Lek, MD     01/17/2024 11:18 AM Patient Name: Adam Beasley MRN:  [id number] Epilepsy Attending: Lek Gregg Referring Physician/Provider: No ref. provider found     Date: 01/17/2024 Duration: 23 minutes Patient history: 58 year old past medical history of diabetes mellitus type 2 and hypertension presenting at bedside or [place] acutely ill with nausea vomiting diarrhea x 4 days along with shortness of breath and fatigue. Level of alertness: Awake, drowsy AEDs during EEG study: Technical aspects: This EEG study was done with scalp electrodes positioned according to the 10-20 International system of electrode placement. Electrical activity was reviewed with band pass filter of 1-70Hz , sensitivity of 7 uV/mm, display speed of 7mm/sec with a 60Hz  notched filter applied as appropriate. EEG data were recorded continuously and digitally stored.  Video monitoring was available and reviewed as appropriate. Description: The posterior dominant rhythm consists of 9 Hz activity of moderate voltage (25-35 uV) seen predominantly in posterior head regions, symmetric and reactive to eye opening and eye closing. Drowsiness was characterized by attenuation of the posterior background rhythm. Sleep was not seen. Hyperventilation and photic stimulation were not performed.   ABNORMALITY -None IMPRESSION: This study is within normal limits. No seizures or epileptiform discharges were seen throughout the recording. A normal interictal EEG does not exclude nor support the diagnosis of epilepsy. Lek Gregg MD Neurology    DG Chest Port 1 View Result Date: 01/17/2024 CLINICAL DATA:  58 year old male with cough and shortness of breath. EXAM: PORTABLE CHEST 1 VIEW COMPARISON:  Chest CT 01/12/2024, portable chest 01/13/2024, and earlier. FINDINGS: Portable AP semi upright view at 0728 hours. Left lung pneumonia with airspace consolidation on the recent CT. Ventilation does not appear significantly improved since that time. Ongoing confluent peripheral left lung opacity. No definite pleural effusion.  Stable cardiac size and mediastinal contours. Stable left IJ vascular catheter. Right lung appears stable and negative. Paucity of bowel gas. No acute osseous abnormality identified. IMPRESSION: Unresolved Left Lung Pneumonia since CT on 01/12/2024. No new cardiopulmonary abnormality. Electronically Signed   By: VEAR Hurst M.D.   On: 01/17/2024 07:51     Signature  -   Lavada Stank M.D on 01/19/2024 at 7:28 AM   -  To page go to [email or web address]

## 2024-01-20 ENCOUNTER — Other Ambulatory Visit (HOSPITAL_COMMUNITY): Payer: Self-pay

## 2024-01-20 DIAGNOSIS — R112 Nausea with vomiting, unspecified: Secondary | ICD-10-CM | POA: Diagnosis not present

## 2024-01-20 DIAGNOSIS — R197 Diarrhea, unspecified: Secondary | ICD-10-CM | POA: Diagnosis not present

## 2024-01-20 LAB — CBC WITH DIFFERENTIAL/PLATELET
Abs Immature Granulocytes: 0.33 K/uL — ABNORMAL HIGH (ref 0.00–0.07)
Basophils Absolute: 0.1 K/uL (ref 0.0–0.1)
Basophils Relative: 1 %
Eosinophils Absolute: 0.1 K/uL (ref 0.0–0.5)
Eosinophils Relative: 1 %
HCT: 30.2 % — ABNORMAL LOW (ref 39.0–52.0)
Hemoglobin: 10.2 g/dL — ABNORMAL LOW (ref 13.0–17.0)
Immature Granulocytes: 4 %
Lymphocytes Relative: 31 %
Lymphs Abs: 2.3 K/uL (ref 0.7–4.0)
MCH: 29.3 pg (ref 26.0–34.0)
MCHC: 33.8 g/dL (ref 30.0–36.0)
MCV: 86.8 fL (ref 80.0–100.0)
Monocytes Absolute: 0.5 K/uL (ref 0.1–1.0)
Monocytes Relative: 7 %
Neutro Abs: 4.2 K/uL (ref 1.7–7.7)
Neutrophils Relative %: 56 %
Platelets: 617 K/uL — ABNORMAL HIGH (ref 150–400)
RBC: 3.48 MIL/uL — ABNORMAL LOW (ref 4.22–5.81)
RDW: 14 % (ref 11.5–15.5)
WBC: 7.5 K/uL (ref 4.0–10.5)
nRBC: 0.3 % — ABNORMAL HIGH (ref 0.0–0.2)

## 2024-01-20 LAB — GLUCOSE, CAPILLARY
Glucose-Capillary: 189 mg/dL — ABNORMAL HIGH (ref 70–99)
Glucose-Capillary: 231 mg/dL — ABNORMAL HIGH (ref 70–99)

## 2024-01-20 LAB — RENAL FUNCTION PANEL
Albumin: 2.7 g/dL — ABNORMAL LOW (ref 3.5–5.0)
Anion gap: 12 (ref 5–15)
BUN: 59 mg/dL — ABNORMAL HIGH (ref 6–20)
CO2: 22 mmol/L (ref 22–32)
Calcium: 8.8 mg/dL — ABNORMAL LOW (ref 8.9–10.3)
Chloride: 105 mmol/L (ref 98–111)
Creatinine, Ser: 2.52 mg/dL — ABNORMAL HIGH (ref 0.61–1.24)
GFR, Estimated: 29 mL/min — ABNORMAL LOW (ref >60)
Glucose, Bld: 167 mg/dL — ABNORMAL HIGH (ref 70–99)
Phosphorus: 4.7 mg/dL — ABNORMAL HIGH (ref 2.5–4.6)
Potassium: 4 mmol/L (ref 3.5–5.1)
Sodium: 139 mmol/L (ref 135–145)

## 2024-01-20 LAB — APTT: aPTT: 33 s (ref 24–36)

## 2024-01-20 LAB — MAGNESIUM: Magnesium: 1.3 mg/dL — ABNORMAL LOW (ref 1.7–2.4)

## 2024-01-20 MED ORDER — "INSULIN SYRINGE-NEEDLE U-100 25G X 1"" 1 ML MISC"
0 refills | Status: AC
  Filled 2024-01-20: qty 30, fill #0

## 2024-01-20 MED ORDER — CARVEDILOL 3.125 MG PO TABS
3.1250 mg | ORAL_TABLET | Freq: Two times a day (BID) | ORAL | 0 refills | Status: AC
  Filled 2024-01-20: qty 60, 30d supply, fill #0

## 2024-01-20 MED ORDER — INSULIN PEN NEEDLE 32G X 4 MM MISC
0 refills | Status: AC
Start: 2024-01-20 — End: ?
  Filled 2024-01-20: qty 100, 30d supply, fill #0

## 2024-01-20 MED ORDER — LANTUS SOLOSTAR 100 UNIT/ML ~~LOC~~ SOPN
20.0000 [IU] | PEN_INJECTOR | Freq: Every day | SUBCUTANEOUS | 0 refills | Status: AC
Start: 2024-01-20 — End: ?
  Filled 2024-01-20: qty 15, 75d supply, fill #0

## 2024-01-20 MED ORDER — MAGNESIUM SULFATE 4 GM/100ML IV SOLN
4.0000 g | Freq: Once | INTRAVENOUS | Status: AC
  Administered 2024-01-20: 4 g via INTRAVENOUS
  Filled 2024-01-20: qty 100

## 2024-01-20 MED ORDER — ACCU-CHEK SOFTCLIX LANCETS MISC
1.0000 | Freq: Three times a day (TID) | 0 refills | Status: AC
  Filled 2024-01-20: qty 100, 30d supply, fill #0

## 2024-01-20 MED ORDER — CARVEDILOL 3.125 MG PO TABS
3.1250 mg | ORAL_TABLET | Freq: Two times a day (BID) | ORAL | Status: DC
  Administered 2024-01-20: 3.125 mg via ORAL
  Filled 2024-01-20: qty 1

## 2024-01-20 MED ORDER — LANCET DEVICE MISC
1.0000 | Freq: Three times a day (TID) | 0 refills | Status: AC
  Filled 2024-01-20: qty 1, 30d supply, fill #0

## 2024-01-20 MED ORDER — AZITHROMYCIN 500 MG PO TABS
500.0000 mg | ORAL_TABLET | Freq: Every day | ORAL | 0 refills | Status: AC
  Filled 2024-01-20: qty 3, 3d supply, fill #0

## 2024-01-20 MED ORDER — AMLODIPINE BESYLATE 10 MG PO TABS
10.0000 mg | ORAL_TABLET | Freq: Every day | ORAL | 0 refills | Status: AC
  Filled 2024-01-20: qty 30, 30d supply, fill #0

## 2024-01-20 MED ORDER — BLOOD GLUCOSE MONITOR SYSTEM W/DEVICE KIT
1.0000 | PACK | Freq: Three times a day (TID) | 0 refills | Status: AC
  Filled 2024-01-20: qty 1, 30d supply, fill #0

## 2024-01-20 MED ORDER — BLOOD GLUCOSE TEST VI STRP
1.0000 | ORAL_STRIP | Freq: Three times a day (TID) | 0 refills | Status: AC
  Filled 2024-01-20: qty 100, 30d supply, fill #0

## 2024-01-20 MED ORDER — INSULIN ASPART 100 UNIT/ML FLEXPEN
PEN_INJECTOR | SUBCUTANEOUS | 0 refills | Status: AC
  Filled 2024-01-20: qty 15, 50d supply, fill #0

## 2024-01-20 MED ORDER — ISOSORBIDE MONONITRATE ER 30 MG PO TB24
30.0000 mg | ORAL_TABLET | Freq: Every day | ORAL | 0 refills | Status: AC
  Filled 2024-01-20: qty 30, 30d supply, fill #0

## 2024-01-20 NOTE — Progress Notes (Signed)
 Discharge order noted. Discharge RN at the bedside. Noted pt needs to complete IV mag prior to discharge. AVS printed/reviewed. Discussed medication safety, needle safety, administration instructions for insulin , and proper storage. Pt verbalized understanding of instructions with teach-back. TOC meds delivered to the bedside. Once IV mag complete, pt is clear to go home.

## 2024-01-20 NOTE — Discharge Summary (Signed)
 Adam Beasley FMW:[id number] DOB: 30-May-1966 DOA: 01/12/2024  PCP: [name], MD  Admit date: 01/12/2024  Discharge date: 01/20/2024  Admitted From: Home   Disposition:  Home   Recommendations for Outpatient Follow-up:   Follow up with PCP in 1-2 weeks  PCP Please obtain BMP/CBC, 2 view CXR in 1week,  (see Discharge instructions)   PCP Please follow up on the following pending results:    Home Health: None   Equipment/Devices: None  Consultations: PCCM, Renal Discharge Condition: Stable    CODE STATUS: Full    Diet Recommendation: Heart Healthy Low Carb    Chief Complaint  Patient presents with   Cough     Brief history of present illness from the day of admission and additional interim summary    58 yo male [facility] with c/o n/v/d x 4 days. Pt also reported sob and general fatigue at time of presentation. He denies documented fever/chills, no accompanying abdominal pain. Upon presentation pt was noted to actually have fever, was tachycardic and noted to have elevated liver enzymes with normal lactate as well as in acute renal failure with BUN/Cr ~50/>8. He was subsequently transferred to [facility], he developed renal failure requiring dialysis, nephrology was consulted he was diagnosed with Legionella pneumonia, transaminitis, he was admitted to ICU, seen by GI, nephrology underwent CRRT, he also had a seizure during this hospital stay.  Transferred to my care on 01/17/2024 on day 5 of hospital stay       Significant Hospital Events: Including procedures, antibiotic start and stop dates in addition to other pertinent events   7/27 - Presented to [facility], transferred to [facility] for admission, later transferred to ICU for initiation of crrt.  7/29 - CRRT discontinued 7/30 - legionella  positive 01/17/2024.  EEG nonacute 01/17/2024 MRI brain limited study but nonacute except for scalp lipoma                                                                 Hospital Course   Acute hypoxic respiratory failure, sepsis, Legionella pneumonia all present upon admission.  Seen by PCCM, treated with empiric antibiotics completed 10-day course of azithromycin , sepsis pathophysiology has resolved currently on room air and 23, back to his baseline will be discharged home on few more days of oral azithromycin .   Rhabdomyolysis with AKI, hypokalemia.  Nephrology on board, has left IJ dialysis catheter, underwent CRRT, fortunately renal function has recovered very well, Foley and dialysis catheter removed on 01/19/2024, renal function continuously improving, stable to be discharged home, case was discussed with nephrologist in detail including his electrophoresis results which were thought to be nonspecific, he is currently symptom-free, will be discharged with outpatient PCP and nephrology follow.   Seizure initially during his hospital stay.  Discussed with  neurology, obtained MRI brain and EEG which were nonacute, MRI was limited but no acute findings except scalp lipoma, previously had negative head CT.  Currently seizure-free, likely due to metabolic derangements.  No headache or focal deficits, stable with outpatient neuro follow-up.  Will not drive for 6 months.   Asymptomatic transaminitis.  Likely due to combination of rhabdomyolysis, Legionella pneumonia and over-the-counter herbal supplements, appreciate GI input seen by [facility].  PCP to trend.   Suspected OSA.  Outpatient sleep study and pulmonary follow-up postdischarge.  PCP to arrange.   Hypertension.  Blood pressure slightly higher medications adjusted further today, prescriptions provided PCP to monitor and adjust.   Dyslipidemia.  Resume home regimen   DM type II.  On [place]  and sliding scale, will get diabetic and insulin   education and will be discharged on the same.  1 month supply of medications and testing supplies provided PCP to monitor.  Lab Results  Component Value Date   HGBA1C 7.6 (H) 01/12/2024   CBG (last 3)  Recent Labs    01/19/24 1616 01/19/24 2123 01/20/24 0747  GLUCAP 129* 246* 189*      Discharge diagnosis     Principal Problem:   Nausea, vomiting, and diarrhea Active Problems:   Sepsis (HCC)   Acute renal failure (HCC)   Legionella pneumophila infection [facility])    Discharge instructions    Discharge Instructions     Discharge instructions   Complete by: As directed    Do not drive, operate heavy machinery, perform activities at heights, swimming or participation in water  activities or provide baby sitting services until you have seen by Primary MD or a Neurologist and advised to do so again.  Follow with Primary MD [name], MD in 7 days   Get CBC, CMP, Magnesium , 2 view Chest X ray -  checked next visit with your primary MD    Activity: As tolerated with Full fall precautions use walker/cane & assistance as needed  Disposition Home   Diet: Heart Healthy low carbohydrate diet, check CBGs q. ACH S  Accuchecks 4 times/day, Once in AM empty stomach and then before each meal. Log in all results and show them to your Prim.MD in 3 days. If any glucose reading is under 80 or above 300 call your Prim MD immidiately. Follow Low glucose instructions for glucose under 80 as instructed.  Special Instructions: If you have smoked or chewed Tobacco  in the last 2 yrs please stop smoking, stop any regular Alcohol  and or any Recreational drug use.  On your next visit with your primary care physician please Get Medicines reviewed and adjusted.  Please request your Prim.MD to go over all Hospital Tests and Procedure/Radiological results at the follow up, please get all Hospital records sent to your Prim MD by signing hospital release before you go home.  If you  experience worsening of your admission symptoms, develop shortness of breath, life threatening emergency, suicidal or homicidal thoughts you must seek medical attention immediately by calling 911 or calling your MD immediately  if symptoms less severe.  You Must read complete instructions/literature along with all the possible adverse reactions/side effects for all the Medicines you take and that have been prescribed to you. Take any new Medicines after you have completely understood and accpet all the possible adverse reactions/side effects.   Do not drive when taking Pain medications.  Do not take more than prescribed Pain, Sleep and Anxiety Medications  Wear Seat belts while  driving.   Increase activity slowly   Complete by: As directed        Discharge Medications   Allergies as of 01/20/2024   No Known Allergies      Medication List     STOP taking these medications    chlorpheniramine-HYDROcodone 10-8 MG/5ML Lqcr Commonly known as: Tussionex Pennkinetic ER       TAKE these medications    Accu-Chek Softclix Lancets lancets 1 each in the morning, at noon, and at bedtime. May substitute to any manufacturer covered by patient's insurance.   amLODipine  10 MG tablet Commonly known as: NORVASC  Take 1 tablet (10 mg total) by mouth daily. Start taking on: January 21, 2024 What changed:  medication strength how much to take   atorvastatin 80 MG tablet Commonly known as: LIPITOR Take 80 mg by mouth daily.   azithromycin  500 MG tablet Commonly known as: ZITHROMAX  Take 1 tablet (500 mg total) by mouth daily. Start taking on: January 21, 2024 What changed:  medication strength how much to take   Blood Glucose Monitor System w/Device Kit 1 each by Does not apply route in the morning, at noon, and at bedtime. May substitute to any manufacturer covered by patient's insurance.   BLOOD GLUCOSE TEST STRIPS Strp 1 each by In Vitro route in the morning, at noon, and at bedtime.  May substitute to any manufacturer covered by patient's insurance.   carvedilol  3.125 MG tablet Commonly known as: COREG  Take 1 tablet (3.125 mg total) by mouth 2 (two) times daily with a meal.   doxazosin  2 MG tablet Commonly known as: CARDURA  Take 2 mg by mouth daily. What changed: Another medication with the same name was removed. Continue taking this medication, and follow the directions you see here.   eplerenone  50 MG tablet Commonly known as: INSPRA  Take 1 tablet (50 mg total) by mouth daily.   insulin  aspart 100 UNIT/ML FlexPen Commonly known as: NOVOLOG  Before each meal 3 times a day, 140-199 - 2 units, 200-250 - 4 units, 251-299 - 6 units,  300-349 - 8 units,  350 or above 10 units.   insulin  glargine 100 UNIT/ML injection Commonly known as: Lantus  Inject 0.2 mLs (20 Units total) into the skin at bedtime. Dispense insulin  pen if approved, if not dispense as needed syringes and needles for 1 month supply. Can switch to any approved Long acting Insulin . Diagnosis E 11.65.   Insulin  Pen Needle 32G X 4 MM Misc Please provide 1 month supply   Insulin  Syringe-Needle U-100 25G X 1 1 ML Misc For 4 times a day insulin  SQ, 1 month supply. Diagnosis E11.65   isosorbide  mononitrate 30 MG 24 hr tablet Commonly known as: IMDUR  Take 1 tablet (30 mg total) by mouth daily. Start taking on: January 21, 2024   Lancet Device Misc 1 each by Does not apply route in the morning, at noon, and at bedtime. May substitute to any manufacturer covered by patient's insurance.   metFORMIN 500 MG tablet Commonly known as: GLUCOPHAGE Take 500 mg by mouth daily with breakfast.         Follow-up Information     Bakare, Mobolaji B, MD. Schedule an appointment as soon as possible for a visit in 1 week(s).   Specialty: Internal Medicine Contact information: [address] [place] [address] [phone]         Melia Lynwood ORN, MD. Schedule an appointment as soon as  possible for a visit in 1  week(s).   Specialties: Nephrology, Vascular Surgery Contact information: [address] [place] [address] [phone]         [facility]. Schedule an appointment as soon as possible for a visit in 1 week(s).   Contact information: [address]     [address] [place]  [address] [phone]                Major procedures and Radiology Reports - PLEASE review detailed and final reports thoroughly  -        MR BRAIN WO CONTRAST Result Date: 01/17/2024 CLINICAL DATA:  Provided history: Seizure disorder, clinical change. EXAM: MRI HEAD WITHOUT CONTRAST TECHNIQUE: Multiplanar, multiecho pulse sequences of the brain and surrounding structures were obtained without intravenous contrast. COMPARISON:  Head CT 01/13/2024. FINDINGS: Intermittently motion degraded examination. Most notably, the axial FLAIR sequence is moderate-to-severely motion degraded, the SWI sequence is severely motion degraded, the coronal whole brain T2 sequence is severely motion degraded, the coronal T2 sequence through the hippocampi is mild-to-moderately motion degraded and the coronal FLAIR sequence through the hippocampi is moderately motion degraded. Within this limitation, findings are as follows. Brain: No age-advanced or lobar predominant cerebral atrophy. No cortical encephalomalacia is identified. No appreciable hippocampal size or signal asymmetry. There is no acute infarct. No evidence of an intracranial mass. Severe motion degradation of the SWI sequence precludes adequate evaluation for chronic intracranial blood products. No extra-axial fluid collection. No midline shift. Vascular: Maintained flow voids within the proximal large arterial vessels. Skull and upper cervical spine: No focal worrisome marrow lesion. Sinuses/Orbits: No mass or acute finding within the imaged orbits. No significant paranasal sinus disease. Other: 2.1 x 0.7 cm  right occipital scalp lipoma. IMPRESSION: 1. Significantly motion degraded examination. 2. Within this limitation, there is no evidence of an acute intracranial abnormality and no specific seizure etiology is identified. 3. 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 Electronically Signed   By: Rockey Childs D.O.   On: 01/17/2024 20:18   EEG adult Result Date: 01/17/2024 Gregg Lek, MD     01/17/2024 11:18 AM Patient Name: Adam Beasley MRN: [id number] Epilepsy Attending: Lek Gregg Referring Physician/Provider: No ref. provider found     Date: 01/17/2024 Duration: 23 minutes Patient history: 58 year old past medical history of diabetes mellitus type 2 and hypertension presenting at bedside or [place] acutely ill with nausea vomiting diarrhea x 4 days along with shortness of breath and fatigue. Level of alertness: Awake, drowsy AEDs during EEG study: Technical aspects: This EEG study was done with scalp electrodes positioned according to the 10-20 International system of electrode placement. Electrical activity was reviewed with band pass filter of 1-70Hz , sensitivity of 7 uV/mm, display speed of 71mm/sec with a 60Hz  notched filter applied as appropriate. EEG data were recorded continuously and digitally stored.  Video monitoring was available and reviewed as appropriate. Description: The posterior dominant rhythm consists of 9 Hz activity of moderate voltage (25-35 uV) seen predominantly in posterior head regions, symmetric and reactive to eye opening and eye closing. Drowsiness was characterized by attenuation of the posterior background rhythm. Sleep was not seen. Hyperventilation and photic stimulation were not performed.   ABNORMALITY -None IMPRESSION: This study is within normal limits. No seizures or epileptiform discharges were seen throughout the recording. A normal interictal EEG does not exclude nor support the diagnosis of epilepsy. Lek Gregg MD Neurology    DG Chest Port 1 View Result  Date: 01/17/2024 CLINICAL DATA:  58 year old  male with cough and shortness of breath. EXAM: PORTABLE CHEST 1 VIEW COMPARISON:  Chest CT 01/12/2024, portable chest 01/13/2024, and earlier. FINDINGS: Portable AP semi upright view at 0728 hours. Left lung pneumonia with airspace consolidation on the recent CT. Ventilation does not appear significantly improved since that time. Ongoing confluent peripheral left lung opacity. No definite pleural effusion. Stable cardiac size and mediastinal contours. Stable left IJ vascular catheter. Right lung appears stable and negative. Paucity of bowel gas. No acute osseous abnormality identified. IMPRESSION: Unresolved Left Lung Pneumonia since CT on 01/12/2024. No new cardiopulmonary abnormality. Electronically Signed   By: VEAR Hurst M.D.   On: 01/17/2024 07:51   US  Abdomen Limited RUQ (LIVER/GB) Result Date: 01/14/2024 CLINICAL DATA:  [id number] LFT elevation 602230. EXAM: ULTRASOUND ABDOMEN LIMITED RIGHT UPPER QUADRANT COMPARISON:  CT scan abdomen and pelvis from 01/12/2024. FINDINGS: Gallbladder: The gallbladder is physiologically distended. Small amount of sludge noted. No gallstones or wall thickening visualized. No sonographic Murphy sign noted by sonographer. Common bile duct: Diameter: Up to 4 mm.  No intrahepatic bile duct dilation. Liver: No focal lesion identified. Within normal limits in parenchymal echogenicity. Portal vein is patent on color Doppler imaging with normal direction of blood flow towards the liver. Other: None. IMPRESSION: *Essentially unremarkable right upper quadrant ultrasound examination. Electronically Signed   By: Ree Molt M.D.   On: 01/14/2024 15:54   ECHOCARDIOGRAM COMPLETE Result Date: 01/13/2024    ECHOCARDIOGRAM REPORT   Patient Name:   MARKS SCALERA Date of Exam: 01/13/2024 Medical Rec #:  [id number]       Height:       72.0 in Accession #:    [id number]      Weight:       241.2 lb Date of Birth:  08/14/1965       BSA:          2.307 m  Patient Age:    58 years        BP:           144/90 mmHg Patient Gender: M               HR:           93 bpm. Exam Location:  Inpatient Procedure: 2D Echo, Color Doppler and Cardiac Doppler (Both Spectral and Color            Flow Doppler were utilized during procedure). Indications:    CHF- Acute Systolic I50.21  History:        Patient has no prior history of Echocardiogram examinations.                 Renal Failure; Risk Factors:Hypertension, Dyslipidemia and                 Diabetes.  Sonographer:    Koleen Popper RDCS Referring Phys: [phone] EKTA V PATEL IMPRESSIONS  1. Left ventricular ejection fraction, by estimation, is 65 to 70%. The left ventricle has normal function. The left ventricle has no regional wall motion abnormalities. There is severe concentric left ventricular hypertrophy. Left ventricular diastolic  parameters were normal.  2. Right ventricular systolic function is normal. The right ventricular size is normal. Tricuspid regurgitation signal is inadequate for assessing PA pressure.  3. The mitral valve is grossly normal. Mild mitral valve regurgitation. No evidence of mitral stenosis.  4. The aortic valve is tricuspid. Aortic valve regurgitation is not visualized. No aortic stenosis is present.  5. The inferior vena cava is  normal in size with <50% respiratory variability, suggesting right atrial pressure of 8 mmHg. FINDINGS  Left Ventricle: Left ventricular ejection fraction, by estimation, is 65 to 70%. The left ventricle has normal function. The left ventricle has no regional wall motion abnormalities. The left ventricular internal cavity size was normal in size. There is  severe concentric left ventricular hypertrophy. Left ventricular diastolic parameters were normal. Right Ventricle: The right ventricular size is normal. No increase in right ventricular wall thickness. Right ventricular systolic function is normal. Tricuspid regurgitation signal is inadequate for assessing PA pressure.  Left Atrium: Left atrial size was normal in size. Right Atrium: Right atrial size was normal in size. Pericardium: There is no evidence of pericardial effusion. Mitral Valve: The mitral valve is grossly normal. Mild mitral valve regurgitation. No evidence of mitral valve stenosis. Tricuspid Valve: The tricuspid valve is normal in structure. Tricuspid valve regurgitation is trivial. No evidence of tricuspid stenosis. Aortic Valve: The aortic valve is tricuspid. Aortic valve regurgitation is not visualized. No aortic stenosis is present. Pulmonic Valve: The pulmonic valve was normal in structure. Pulmonic valve regurgitation is not visualized. No evidence of pulmonic stenosis. Aorta: The aortic root is normal in size and structure. Venous: The inferior vena cava is normal in size with less than 50% respiratory variability, suggesting right atrial pressure of 8 mmHg. IAS/Shunts: The atrial septum is grossly normal.  LEFT VENTRICLE PLAX 2D LVIDd:         4.70 cm   Diastology LVIDs:         2.90 cm   LV e' medial:    8.05 cm/s LV PW:         1.60 cm   LV E/e' medial:  12.1 LV IVS:        1.60 cm   LV e' lateral:   12.20 cm/s LVOT diam:     2.10 cm   LV E/e' lateral: 8.0 LV SV:         79 LV SV Index:   34 LVOT Area:     3.46 cm  RIGHT VENTRICLE             IVC RV S prime:     14.70 cm/s  IVC diam: 1.80 cm TAPSE (M-mode): 2.0 cm LEFT ATRIUM             Index        RIGHT ATRIUM           Index LA diam:        4.00 cm 1.73 cm/m   RA Area:     15.30 cm LA Vol (A2C):   36.5 ml 15.82 ml/m  RA Volume:   35.80 ml  15.52 ml/m LA Vol (A4C):   62.9 ml 27.26 ml/m LA Biplane Vol: 50.7 ml 21.98 ml/m  AORTIC VALVE LVOT Vmax:   138.00 cm/s LVOT Vmean:  94.600 cm/s LVOT VTI:    0.229 m  AORTA Ao Root diam: 3.10 cm Ao Asc diam:  3.40 cm MITRAL VALVE MV Area (PHT): 4.31 cm    SHUNTS MV Decel Time: 176 msec    Systemic VTI:  0.23 m MV E velocity: 97.50 cm/s  Systemic Diam: 2.10 cm MV A velocity: 92.80 cm/s MV E/A ratio:  1.05  Soyla Merck MD Electronically signed by Soyla Merck MD Signature Date/Time: 01/13/2024/11:17:42 PM    Final    DG Chest Port 1 View Result Date: 01/13/2024 CLINICAL DATA:  Tunneled central venous catheter placement EXAM: PORTABLE  CHEST 1 VIEW COMPARISON:  01/12/2024 FINDINGS: Left internal jugular temporary hemodialysis catheter tip seen overlying the expected brachiocephalic vein. No pneumothorax. Progressive consolidation within the left lung. Small left pleural effusion suspected. Right lung is clear. No pleural effusion on the right. Cardiac size within normal limits. No acute bone abnormality. IMPRESSION: 1. Left internal jugular temporary hemodialysis catheter tip within the brachiocephalic vein. No pneumothorax. 2. Progressive consolidation within the left lung. Associated small left pleural effusion. Electronically Signed   By: Dorethia Molt M.D.   On: 01/13/2024 03:39   CT HEAD WO CONTRAST ( ) Result Date: 01/13/2024 CLINICAL DATA:  Seizure, new-onset, no history of trauma EXAM: CT HEAD WITHOUT CONTRAST TECHNIQUE: Contiguous axial images were obtained from the base of the skull through the vertex without intravenous contrast. RADIATION DOSE REDUCTION: This exam was performed according to the departmental dose-optimization program which includes automated exposure control, adjustment of the mA and/or kV according to patient size and/or use of iterative reconstruction technique. COMPARISON:  None Available. FINDINGS: Brain: No evidence of acute infarction, hemorrhage, hydrocephalus, extra-axial collection or mass lesion/mass effect. Vascular: No hyperdense vessel or unexpected calcification. Skull: Normal. Negative for fracture or focal lesion. Sinuses/Orbits: No acute finding. IMPRESSION: No evidence of acute abnormality. Electronically Signed   By: Gilmore GORMAN Molt M.D.   On: 01/13/2024 01:04   CT CHEST ABDOMEN PELVIS WO CONTRAST Result Date: 01/12/2024 CLINICAL DATA:  Sepsis EXAM: CT  CHEST, ABDOMEN AND PELVIS WITHOUT CONTRAST TECHNIQUE: Multidetector CT imaging of the chest, abdomen and pelvis was performed following the standard protocol without IV contrast. RADIATION DOSE REDUCTION: This exam was performed according to the departmental dose-optimization program which includes automated exposure control, adjustment of the mA and/or kV according to patient size and/or use of iterative reconstruction technique. COMPARISON:  10/25/2023 FINDINGS: CT CHEST FINDINGS Cardiovascular: Mild cardiomegaly. Minimal atheromatous vascular calcification of the thoracic aorta. Mediastinum/Nodes: Likely reactive prevascular node 1.0 cm in short axis on image 20 series 301. Lungs/Pleura: New consolidation in a substantial portion of the left upper lobe and in the superior segment left lower lobe favoring multilobar pneumonia. Associated air bronchograms without mass effect on the associated airways. Small left pleural effusion. Musculoskeletal: Subacute healing left anterior eighth rib fracture on image 99 series 302, not present/perceptible on 10/25/2023. Thoracic spondylosis. Mild bilateral gynecomastia. CT ABDOMEN PELVIS FINDINGS Hepatobiliary: Diffuse hepatic steatosis. No focal hepatic lesion identified on today's noncontrast exam. Pancreas: Unremarkable Spleen: Unremarkable Adrenals/Urinary Tract: Unremarkable Stomach/Bowel: Distal colonic and rectal air-levels favoring diarrheal process. No dilated bowel. Appendix poorly seen today. Vascular/Lymphatic: Unremarkable Reproductive: Unremarkable Other: No supplemental non-categorized findings. Musculoskeletal: Stable localized sclerosis along the iliac side of the left SI joint is probably related to arthropathy although technically nonspecific. No change from 10/25/2023. Mild lower lumbar spondylosis and degenerative disc disease. IMPRESSION: 1. New consolidation in a substantial portion of the left upper lobe and in the superior segment left lower lobe favoring  multilobar pneumonia. Associated air bronchograms without mass effect on the associated airways. Small left pleural effusion. 2. Subacute healing left anterior eighth rib fracture, not present/perceptible on 10/25/2023. 3. Diffuse hepatic steatosis. 4. Distal colonic and rectal air-levels favoring diarrheal process. 5. Mild cardiomegaly. 6. Stable localized sclerosis along the iliac side of the left SI joint is probably related to arthropathy although technically nonspecific. No change from 10/25/2023. 7. Mild lower lumbar spondylosis and degenerative disc disease. Electronically Signed   By: Ryan Salvage M.D.   On: 01/12/2024 12:16   DG Chest Portable 1 View  Result Date: 01/12/2024 CLINICAL DATA:  Cough. EXAM: PORTABLE CHEST 1 VIEW COMPARISON:  10/25/2023 FINDINGS: The heart size and mediastinal contours are within normal limits. Patient is partially rotated to the left. Asymmetric opacity in the left hemithorax is probably due to overlying soft tissue attenuation, with left lung infiltrate considered less likely. No pleural effusion. IMPRESSION: Asymmetric opacity in left hemithorax, probably due to overlying soft tissue attenuation, with left lung opacity considered less likely. Recommend routine PA and lateral chest radiographs for further evaluation. Electronically Signed   By: Norleen DELENA Kil M.D.   On: 01/12/2024 10:51    Micro Results    Recent Results (from the past 240 hours)  MRSA Next Gen by PCR, Nasal     Status: None   Collection Time: 01/12/24 12:14 AM   Specimen: Nasal Mucosa; Nasal Swab  Result Value Ref Range Status   MRSA by PCR Next Gen NOT DETECTED NOT DETECTED Final    Comment: (NOTE) The GeneXpert MRSA Assay (FDA approved for NASAL specimens only), is one component of a comprehensive MRSA colonization surveillance program. It is not intended to diagnose MRSA infection nor to guide or monitor treatment for MRSA infections. Test performance is not FDA approved in patients less  than 80 years old. Performed at [facility], [address], [place], [place] [address]   Blood Culture (routine x 2)     Status: None   Collection Time: 01/12/24 10:01 AM   Specimen: BLOOD  Result Value Ref Range Status   Specimen Description   Final    BLOOD RIGHT ANTECUBITAL Performed at [facility], [address] [place], [place] [address]    Special Requests   Final    BOTTLES DRAWN AEROBIC AND ANAEROBIC Blood Culture adequate volume Performed at [facility], [address] [place], [place] [address]    Culture   Final    NO GROWTH 5 DAYS Performed at [facility], [address], [place], [place] [address]    Report Status 01/17/2024 FINAL  Final  Blood Culture (routine x 2)     Status: None   Collection Time: 01/12/24 10:12 AM   Specimen: BLOOD  Result Value Ref Range Status   Specimen Description   Final    BLOOD LEFT ANTECUBITAL Performed at [facility], [address] [place], [place] [address]    Special Requests   Final    BOTTLES DRAWN AEROBIC AND ANAEROBIC Blood Culture adequate volume Performed at [facility], [address] [place], [place] [address]    Culture   Final    NO GROWTH 5 DAYS Performed at [facility], [address] [place], [place] [address]    Report Status 01/17/2024 FINAL  Final  Resp panel by RT-PCR (RSV, Flu A&B, Covid) Anterior Nasal Swab     Status: None   Collection Time: 01/12/24 12:30 PM   Specimen: Anterior Nasal Swab  Result Value Ref Range Status   SARS Coronavirus 2 by RT PCR NEGATIVE NEGATIVE Final    Comment: (NOTE) SARS-CoV-2 target nucleic acids are NOT DETECTED.  The SARS-CoV-2 RNA is generally detectable in upper respiratory specimens during the acute phase of infection. The lowest concentration of SARS-CoV-2 viral copies this assay can detect is 138 copies/mL. A negative result does not preclude SARS-Cov-2 infection and should not be used as  the sole basis for treatment or other patient management decisions. A  negative result may occur with  improper specimen collection/handling, submission of specimen other than nasopharyngeal swab, presence of viral mutation(s) within the areas targeted by this assay, and inadequate number of viral copies(<138 copies/mL). A negative result must be combined with clinical observations, patient history, and epidemiological information. The expected result is Negative.  Fact Sheet for Patients:  BloggerCourse.com  Fact Sheet for Healthcare Providers:  SeriousBroker.it  This test is no t yet approved or cleared by the [place]  FDA and  has been authorized for detection and/or diagnosis of SARS-CoV-2 by FDA under an Emergency Use Authorization (EUA). This EUA will remain  in effect (meaning this test can be used) for the duration of the COVID-19 declaration under Section 564(b)(1) of the Act, 21 U.S.C.section 360bbb-3(b)(1), unless the authorization is terminated  or revoked sooner.       Influenza A by PCR NEGATIVE NEGATIVE Final   Influenza B by PCR NEGATIVE NEGATIVE Final    Comment: (NOTE) The Xpert Xpress SARS-CoV-2/FLU/RSV plus assay is intended as an aid in the diagnosis of influenza from Nasopharyngeal swab specimens and should not be used as a sole basis for treatment. Nasal washings and aspirates are unacceptable for Xpert Xpress SARS-CoV-2/FLU/RSV testing.  Fact Sheet for Patients: BloggerCourse.com  Fact Sheet for Healthcare Providers: SeriousBroker.it  This test is not yet approved or cleared by the [place]  FDA and has been authorized for detection and/or diagnosis of SARS-CoV-2 by FDA under an Emergency Use Authorization (EUA). This EUA will remain in effect (meaning this test can be used) for the duration of the COVID-19 declaration under Section 564(b)(1) of  the Act, 21 U.S.C. section 360bbb-3(b)(1), unless the authorization is terminated or revoked.     Resp Syncytial Virus by PCR NEGATIVE NEGATIVE Final    Comment: (NOTE) Fact Sheet for Patients: BloggerCourse.com  Fact Sheet for Healthcare Providers: SeriousBroker.it  This test is not yet approved or cleared by the [place]  FDA and has been authorized for detection and/or diagnosis of SARS-CoV-2 by FDA under an Emergency Use Authorization (EUA). This EUA will remain in effect (meaning this test can be used) for the duration of the COVID-19 declaration under Section 564(b)(1) of the Act, 21 U.S.C. section 360bbb-3(b)(1), unless the authorization is terminated or revoked.  Performed at [facility], [address] [place], [place] [address]   Urine Culture (for pregnant, neutropenic or urologic patients or patients with an indwelling urinary catheter)     Status: None   Collection Time: 01/12/24  5:00 PM   Specimen: Urine, Clean Catch  Result Value Ref Range Status   Specimen Description URINE, CLEAN CATCH  Final   Special Requests NONE  Final   Culture   Final    NO GROWTH Performed at [facility], [address], [place], [place] [address]    Report Status 01/14/2024 FINAL  Final  Gastrointestinal Panel by PCR , Stool     Status: None   Collection Time: 01/13/24  8:50 AM   Specimen: Stool  Result Value Ref Range Status   Campylobacter species NOT DETECTED NOT DETECTED Final   Plesimonas shigelloides NOT DETECTED NOT DETECTED Final   Salmonella species NOT DETECTED NOT DETECTED Final   Yersinia enterocolitica NOT DETECTED NOT DETECTED Final   Vibrio species NOT DETECTED NOT DETECTED Final   Vibrio cholerae NOT DETECTED NOT DETECTED Final   Enteroaggregative E coli (EAEC) NOT DETECTED NOT DETECTED Final   Enteropathogenic E coli (EPEC) NOT DETECTED NOT  DETECTED Final   Enterotoxigenic E coli (ETEC)  NOT DETECTED NOT DETECTED Final   Shiga like toxin producing E coli (STEC) NOT DETECTED NOT DETECTED Final   Shigella/Enteroinvasive E coli (EIEC) NOT DETECTED NOT DETECTED Final   Cryptosporidium NOT DETECTED NOT DETECTED Final   Cyclospora cayetanensis NOT DETECTED NOT DETECTED Final   Entamoeba histolytica NOT DETECTED NOT DETECTED Final   Giardia lamblia NOT DETECTED NOT DETECTED Final   Adenovirus F40/41 NOT DETECTED NOT DETECTED Final   Astrovirus NOT DETECTED NOT DETECTED Final   Norovirus GI/GII NOT DETECTED NOT DETECTED Final   Rotavirus A NOT DETECTED NOT DETECTED Final   Sapovirus (I, II, IV, and V) NOT DETECTED NOT DETECTED Final    Comment: Performed at [facility], [address] [place], [place] [address]  Respiratory (~20 pathogens) panel by PCR     Status: None   Collection Time: 01/14/24  8:09 AM   Specimen: Nasopharyngeal Swab; Respiratory  Result Value Ref Range Status   Adenovirus NOT DETECTED NOT DETECTED Final   Coronavirus 229E NOT DETECTED NOT DETECTED Final    Comment: (NOTE) The Coronavirus on the Respiratory Panel, DOES NOT test for the novel  Coronavirus (2019 nCoV)    Coronavirus HKU1 NOT DETECTED NOT DETECTED Final   Coronavirus NL63 NOT DETECTED NOT DETECTED Final   Coronavirus OC43 NOT DETECTED NOT DETECTED Final   Metapneumovirus NOT DETECTED NOT DETECTED Final   Rhinovirus / Enterovirus NOT DETECTED NOT DETECTED Final   Influenza A NOT DETECTED NOT DETECTED Final   Influenza B NOT DETECTED NOT DETECTED Final   Parainfluenza Virus 1 NOT DETECTED NOT DETECTED Final   Parainfluenza Virus 2 NOT DETECTED NOT DETECTED Final   Parainfluenza Virus 3 NOT DETECTED NOT DETECTED Final   Parainfluenza Virus 4 NOT DETECTED NOT DETECTED Final   Respiratory Syncytial Virus NOT DETECTED NOT DETECTED Final   Bordetella pertussis NOT DETECTED NOT DETECTED Final   Bordetella Parapertussis NOT DETECTED NOT DETECTED Final   Chlamydophila pneumoniae NOT  DETECTED NOT DETECTED Final   Mycoplasma pneumoniae NOT DETECTED NOT DETECTED Final    Comment: Performed at [facility], [address], [place], [place] [address]    Today   Subjective    Adeeb Konecny today has no headache,no chest abdominal pain,no new weakness tingling or numbness, feels much better wants to go home today.    Objective   Blood pressure 150/85, pulse 81, temperature 98.6 F (37 C), temperature source Oral, resp. rate 20, height 6' (1.829 m), weight 104.3 kg, SpO2 93%.   Intake/Output Summary (Last 24 hours) at 01/20/2024 0902 Last data filed at 01/19/2024 1800 Gross per 24 hour  Intake 480 ml  Output --  Net 480 ml    Exam  Awake Alert, No new F.N deficits,    [place].AT,PERRAL Supple Neck,   Symmetrical Chest wall movement, Good air movement bilaterally, CTAB RRR,No Gallops,   +ve B.Sounds, Abd Soft, Non tender,  No Cyanosis, Clubbing or edema    Data Review   Recent Labs  Lab 01/15/24 0426 01/16/24 0426 01/17/24 0630 01/18/24 0556 01/20/24 0603  WBC 6.3 6.2 7.2 6.6 7.5  HGB 10.5* 9.5* 10.2* 9.9* 10.2*  HCT 29.4* 27.6* 29.4* 28.3* 30.2*  PLT 238 305 436* 506* 617*  MCV 83.8 85.7 84.7 85.5 86.8  MCH 29.9 29.5 29.4 29.9 29.3  MCHC 35.7 34.4 34.7 35.0 33.8  RDW 13.8 13.9 13.7 13.9 14.0  LYMPHSABS  --   --   --   --  2.3  MONOABS  --   --   --   --  0.5  EOSABS  --   --   --   --  0.1  BASOSABS  --   --   --   --  0.1    Recent Labs  Lab 01/14/24 0752 01/14/24 0753 01/15/24 0426 01/15/24 1714 01/16/24 0426 01/16/24 1641 01/17/24 0630 01/17/24 1545 01/18/24 0556 01/18/24 1543 01/19/24 0530 01/19/24 1145 01/20/24 0603  NA  --    < > 131*  131*   < > 130*   < > 133*   < > 136 136 137 134* 139  K  --    < > 3.4*  3.4*   < > 3.5   < > 3.4*   < > 4.0 3.2* 3.3* 3.5 4.0  CL  --    < > 99  99   < > 96*   < > 97*   < > 101 99 103 102 105  CO2  --    < > 22  22   < > 23   < > 21*   < > 20* 24 23 21* 22  ANIONGAP  --    < > 10   10   < > 11   < > 15   < > 15 13 11 11 12   GLUCOSE  --    < > 215*  214*   < > 281*   < > 237*   < > 186* 219* 191* 284* 167*  BUN  --    < > 38*  37*   < > 57*   < > 73*   < > 76* 74* 70* 68* 59*  CREATININE  --    < > 5.83*  5.74*   < > 7.87*   < > 7.77*   < > 6.10* 5.12* 3.82* 3.45* 2.52*  AST 269*  --  195*  --   --   --   --   --   --   --   --   --   --   ALT 136*  --  113*  --   --   --   --   --   --   --   --   --   --   ALKPHOS 61  --  61  --   --   --   --   --   --   --   --   --   --   BILITOT 1.6*  --  1.4*  --   --   --   --   --   --   --   --   --   --   ALBUMIN 2.4*   < > 2.3*  2.3*   < > 2.1*   < > 2.4*   < > 2.4* 2.4* 2.5* 2.5* 2.7*  MG  --    < > 2.4  --  2.5*  --  2.2  --  2.0  --  1.7  --  1.3*  PHOS  --    < > 1.9*   < > 3.5   < > 4.3   < > 5.8* 5.3* 5.2* 4.7* 4.7*  CALCIUM  --    < > 7.8*  7.9*   < > 8.0*   < > 8.4*   < > 8.8* 8.7* 8.7* 8.6* 8.8*   < > =  values in this interval not displayed.    Total Time in preparing paper work, data evaluation and todays exam - 35 minutes  Signature  -    Lavada Stank M.D on 01/20/2024 at 9:02 AM   -  To page go to [email or web address]

## 2024-01-20 NOTE — Plan of Care (Signed)
   Problem: Health Behavior/Discharge Planning: Goal: Ability to manage health-related needs will improve Outcome: Progressing   Problem: Clinical Measurements: Goal: Will remain free from infection Outcome: Progressing   Problem: Coping: Goal: Level of anxiety will decrease Outcome: Progressing

## 2024-01-20 NOTE — Discharge Instructions (Addendum)
 Do not drive, operate heavy machinery, perform activities at heights, swimming or participation in water  activities or provide baby sitting services until you have seen by Primary MD or a Neurologist and advised to do so again.  Follow with Primary MD [name], MD in 7 days   Get CBC, CMP, Magnesium , 2 view Chest X ray -  checked next visit with your primary MD    Activity: As tolerated with Full fall precautions use walker/cane & assistance as needed  Disposition Home   Diet: Heart Healthy low carbohydrate diet, check CBGs q. ACH S  Accuchecks 4 times/day, Once in AM empty stomach and then before each meal. Log in all results and show them to your Prim.MD in 3 days. If any glucose reading is under 80 or above 300 call your Prim MD immidiately. Follow Low glucose instructions for glucose under 80 as instructed.  Special Instructions: If you have smoked or chewed Tobacco  in the last 2 yrs please stop smoking, stop any regular Alcohol  and or any Recreational drug use.  On your next visit with your primary care physician please Get Medicines reviewed and adjusted.  Please request your Prim.MD to go over all Hospital Tests and Procedure/Radiological results at the follow up, please get all Hospital records sent to your Prim MD by signing hospital release before you go home.  If you experience worsening of your admission symptoms, develop shortness of breath, life threatening emergency, suicidal or homicidal thoughts you must seek medical attention immediately by calling 911 or calling your MD immediately  if symptoms less severe.  You Must read complete instructions/literature along with all the possible adverse reactions/side effects for all the Medicines you take and that have been prescribed to you. Take any new Medicines after you have completely understood and accpet all the possible adverse reactions/side effects.   Do not drive when taking Pain medications.  Do not take more  than prescribed Pain, Sleep and Anxiety Medications  Wear Seat belts while driving.

## 2024-01-20 NOTE — Progress Notes (Addendum)
 AVS reviewed with patient.  Talked about Insulin  and drawing up insulin  and this Lantus  pen.  Patient verbalized understanding of all medicaitons.

## 2024-01-21 DIAGNOSIS — Z125 Encounter for screening for malignant neoplasm of prostate: Secondary | ICD-10-CM | POA: Diagnosis not present

## 2024-01-21 DIAGNOSIS — Z789 Other specified health status: Secondary | ICD-10-CM | POA: Diagnosis not present

## 2024-01-21 DIAGNOSIS — E1165 Type 2 diabetes mellitus with hyperglycemia: Secondary | ICD-10-CM | POA: Diagnosis not present

## 2024-01-21 DIAGNOSIS — E7849 Other hyperlipidemia: Secondary | ICD-10-CM | POA: Diagnosis not present

## 2024-01-21 DIAGNOSIS — R748 Abnormal levels of other serum enzymes: Secondary | ICD-10-CM | POA: Diagnosis not present

## 2024-01-21 DIAGNOSIS — I10 Essential (primary) hypertension: Secondary | ICD-10-CM | POA: Diagnosis not present

## 2024-01-21 DIAGNOSIS — Z87898 Personal history of other specified conditions: Secondary | ICD-10-CM | POA: Diagnosis not present

## 2024-01-21 DIAGNOSIS — R634 Abnormal weight loss: Secondary | ICD-10-CM | POA: Diagnosis not present

## 2024-01-21 DIAGNOSIS — D75839 Thrombocytosis, unspecified: Secondary | ICD-10-CM | POA: Diagnosis not present

## 2024-01-21 DIAGNOSIS — A481 Legionnaires' disease: Secondary | ICD-10-CM | POA: Diagnosis not present

## 2024-01-21 DIAGNOSIS — D649 Anemia, unspecified: Secondary | ICD-10-CM | POA: Diagnosis not present

## 2024-01-21 DIAGNOSIS — N170 Acute kidney failure with tubular necrosis: Secondary | ICD-10-CM | POA: Diagnosis not present

## 2024-01-21 NOTE — Procedures (Signed)
 Orders only

## 2024-01-24 DIAGNOSIS — D649 Anemia, unspecified: Secondary | ICD-10-CM | POA: Diagnosis not present

## 2024-01-24 DIAGNOSIS — Z125 Encounter for screening for malignant neoplasm of prostate: Secondary | ICD-10-CM | POA: Diagnosis not present

## 2024-01-24 DIAGNOSIS — E7849 Other hyperlipidemia: Secondary | ICD-10-CM | POA: Diagnosis not present

## 2024-01-24 DIAGNOSIS — R748 Abnormal levels of other serum enzymes: Secondary | ICD-10-CM | POA: Diagnosis not present

## 2024-01-24 DIAGNOSIS — N170 Acute kidney failure with tubular necrosis: Secondary | ICD-10-CM | POA: Diagnosis not present

## 2024-01-24 DIAGNOSIS — I10 Essential (primary) hypertension: Secondary | ICD-10-CM | POA: Diagnosis not present

## 2024-03-04 DIAGNOSIS — E7849 Other hyperlipidemia: Secondary | ICD-10-CM | POA: Diagnosis not present

## 2024-03-04 DIAGNOSIS — I10 Essential (primary) hypertension: Secondary | ICD-10-CM | POA: Diagnosis not present

## 2024-03-04 DIAGNOSIS — Z1211 Encounter for screening for malignant neoplasm of colon: Secondary | ICD-10-CM | POA: Diagnosis not present

## 2024-03-04 DIAGNOSIS — N170 Acute kidney failure with tubular necrosis: Secondary | ICD-10-CM | POA: Diagnosis not present

## 2024-03-04 DIAGNOSIS — R634 Abnormal weight loss: Secondary | ICD-10-CM | POA: Diagnosis not present

## 2024-03-04 DIAGNOSIS — R748 Abnormal levels of other serum enzymes: Secondary | ICD-10-CM | POA: Diagnosis not present

## 2024-03-04 DIAGNOSIS — E1165 Type 2 diabetes mellitus with hyperglycemia: Secondary | ICD-10-CM | POA: Diagnosis not present

## 2024-03-04 DIAGNOSIS — A481 Legionnaires' disease: Secondary | ICD-10-CM | POA: Diagnosis not present

## 2024-03-04 DIAGNOSIS — Z23 Encounter for immunization: Secondary | ICD-10-CM | POA: Diagnosis not present

## 2024-03-04 DIAGNOSIS — Z0001 Encounter for general adult medical examination with abnormal findings: Secondary | ICD-10-CM | POA: Diagnosis not present

## 2024-03-04 DIAGNOSIS — D75839 Thrombocytosis, unspecified: Secondary | ICD-10-CM | POA: Diagnosis not present

## 2024-03-04 DIAGNOSIS — Z125 Encounter for screening for malignant neoplasm of prostate: Secondary | ICD-10-CM | POA: Diagnosis not present

## 2024-03-09 DIAGNOSIS — R748 Abnormal levels of other serum enzymes: Secondary | ICD-10-CM | POA: Diagnosis not present

## 2024-03-09 DIAGNOSIS — N170 Acute kidney failure with tubular necrosis: Secondary | ICD-10-CM | POA: Diagnosis not present

## 2024-03-09 DIAGNOSIS — E7849 Other hyperlipidemia: Secondary | ICD-10-CM | POA: Diagnosis not present

## 2024-03-09 DIAGNOSIS — I10 Essential (primary) hypertension: Secondary | ICD-10-CM | POA: Diagnosis not present

## 2024-03-09 DIAGNOSIS — D649 Anemia, unspecified: Secondary | ICD-10-CM | POA: Diagnosis not present

## 2024-05-26 DIAGNOSIS — I10 Essential (primary) hypertension: Secondary | ICD-10-CM | POA: Diagnosis not present

## 2024-05-26 DIAGNOSIS — M545 Low back pain, unspecified: Secondary | ICD-10-CM | POA: Diagnosis not present

## 2024-05-26 DIAGNOSIS — R809 Proteinuria, unspecified: Secondary | ICD-10-CM | POA: Diagnosis not present

## 2024-05-26 DIAGNOSIS — E1165 Type 2 diabetes mellitus with hyperglycemia: Secondary | ICD-10-CM | POA: Diagnosis not present

## 2024-06-04 DIAGNOSIS — R809 Proteinuria, unspecified: Secondary | ICD-10-CM | POA: Diagnosis not present
